# Patient Record
Sex: Female | Born: 1980
Health system: Southern US, Community
[De-identification: ages and names within clinical notes are randomized; demographics above are authoritative.]

## PROBLEM LIST (undated history)

## (undated) DIAGNOSIS — R0602 Shortness of breath: Secondary | ICD-10-CM

## (undated) DIAGNOSIS — R7303 Prediabetes: Secondary | ICD-10-CM

## (undated) DIAGNOSIS — M549 Dorsalgia, unspecified: Secondary | ICD-10-CM

## (undated) DIAGNOSIS — E538 Deficiency of other specified B group vitamins: Secondary | ICD-10-CM

## (undated) DIAGNOSIS — R5383 Other fatigue: Secondary | ICD-10-CM

## (undated) DIAGNOSIS — K5900 Constipation, unspecified: Secondary | ICD-10-CM

## (undated) DIAGNOSIS — E669 Obesity, unspecified: Secondary | ICD-10-CM

## (undated) DIAGNOSIS — E559 Vitamin D deficiency, unspecified: Secondary | ICD-10-CM

## (undated) DIAGNOSIS — R12 Heartburn: Secondary | ICD-10-CM

## (undated) HISTORY — DX: Vitamin D deficiency, unspecified: E55.9

## (undated) HISTORY — PX: LAPAROSCOPIC GASTRIC BANDING: SHX1100

## (undated) HISTORY — DX: Obesity, unspecified: E66.9

## (undated) HISTORY — DX: Dorsalgia, unspecified: M54.9

## (undated) HISTORY — DX: Prediabetes: R73.03

## (undated) HISTORY — DX: Heartburn: R12

## (undated) HISTORY — DX: Constipation, unspecified: K59.00

## (undated) HISTORY — DX: Shortness of breath: R06.02

## (undated) HISTORY — DX: Deficiency of other specified B group vitamins: E53.8

## (undated) HISTORY — DX: Other fatigue: R53.83

---
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
## 2002-03-21 ENCOUNTER — Other Ambulatory Visit: Admission: RE | Admit: 2002-03-21 | Discharge: 2002-03-21 | Payer: Self-pay | Admitting: *Deleted

## 2003-06-03 ENCOUNTER — Other Ambulatory Visit: Admission: RE | Admit: 2003-06-03 | Discharge: 2003-06-03 | Payer: Self-pay | Admitting: Obstetrics & Gynecology

## 2004-09-01 ENCOUNTER — Emergency Department (HOSPITAL_COMMUNITY): Admission: EM | Admit: 2004-09-01 | Discharge: 2004-09-01 | Payer: Self-pay | Admitting: Emergency Medicine

## 2005-06-13 ENCOUNTER — Ambulatory Visit (HOSPITAL_COMMUNITY): Admission: RE | Admit: 2005-06-13 | Discharge: 2005-06-13 | Payer: Self-pay | Admitting: Surgery

## 2005-06-14 ENCOUNTER — Ambulatory Visit (HOSPITAL_COMMUNITY): Admission: RE | Admit: 2005-06-14 | Discharge: 2005-06-14 | Payer: Self-pay | Admitting: Surgery

## 2006-07-25 ENCOUNTER — Ambulatory Visit (HOSPITAL_COMMUNITY): Admission: RE | Admit: 2006-07-25 | Discharge: 2006-07-25 | Payer: Self-pay | Admitting: Obstetrics & Gynecology

## 2009-03-13 ENCOUNTER — Emergency Department (HOSPITAL_COMMUNITY): Admission: EM | Admit: 2009-03-13 | Discharge: 2009-03-13 | Payer: Self-pay | Admitting: Emergency Medicine

## 2009-06-30 ENCOUNTER — Emergency Department (HOSPITAL_COMMUNITY): Admission: EM | Admit: 2009-06-30 | Discharge: 2009-06-30 | Payer: Self-pay | Admitting: Family Medicine

## 2010-01-13 ENCOUNTER — Observation Stay (HOSPITAL_COMMUNITY): Admission: EM | Admit: 2010-01-13 | Discharge: 2010-01-13 | Payer: Self-pay | Admitting: Emergency Medicine

## 2010-01-29 ENCOUNTER — Emergency Department (HOSPITAL_COMMUNITY): Admission: EM | Admit: 2010-01-29 | Discharge: 2010-01-29 | Payer: Self-pay | Admitting: Emergency Medicine

## 2010-11-25 ENCOUNTER — Ambulatory Visit: Payer: Self-pay | Admitting: Specialist

## 2010-12-01 ENCOUNTER — Ambulatory Visit: Payer: Self-pay | Admitting: Specialist

## 2011-03-12 LAB — DIFFERENTIAL
Basophils Absolute: 0.1 10*3/uL (ref 0.0–0.1)
Basophils Relative: 1 % (ref 0–1)
Eosinophils Absolute: 0 10*3/uL (ref 0.0–0.7)
Eosinophils Relative: 0 % (ref 0–5)
Lymphocytes Relative: 22 % (ref 12–46)
Lymphs Abs: 2.6 10*3/uL (ref 0.7–4.0)
Monocytes Absolute: 0.7 10*3/uL (ref 0.1–1.0)
Monocytes Relative: 6 % (ref 3–12)
Neutro Abs: 8.7 10*3/uL — ABNORMAL HIGH (ref 1.7–7.7)
Neutrophils Relative %: 72 % (ref 43–77)

## 2011-03-12 LAB — CBC
HCT: 42.1 % (ref 36.0–46.0)
Hemoglobin: 14.3 g/dL (ref 12.0–15.0)
MCHC: 33.9 g/dL (ref 30.0–36.0)
MCV: 83.2 fL (ref 78.0–100.0)
Platelets: 383 10*3/uL (ref 150–400)
RBC: 5.05 MIL/uL (ref 3.87–5.11)
RDW: 13.7 % (ref 11.5–15.5)
WBC: 12.2 10*3/uL — ABNORMAL HIGH (ref 4.0–10.5)

## 2011-03-12 LAB — URINALYSIS, ROUTINE W REFLEX MICROSCOPIC
Glucose, UA: NEGATIVE mg/dL
Hgb urine dipstick: NEGATIVE
Ketones, ur: >80 mg/dL — AB
Nitrite: NEGATIVE
Protein, ur: NEGATIVE mg/dL
Specific Gravity, Urine: 1.034 — ABNORMAL HIGH (ref 1.005–1.030)
Urobilinogen, UA: 1 mg/dL (ref 0.0–1.0)
pH: 5.5 (ref 5.0–8.0)

## 2011-03-12 LAB — POCT I-STAT, CHEM 8
BUN: 12 mg/dL (ref 6–23)
Calcium, Ion: 1.13 mmol/L (ref 1.12–1.32)
Chloride: 108 mEq/L (ref 96–112)
Creatinine, Ser: 0.8 mg/dL (ref 0.4–1.2)
Glucose, Bld: 93 mg/dL (ref 70–99)
HCT: 44 % (ref 36.0–46.0)
Hemoglobin: 15 g/dL (ref 12.0–15.0)
Potassium: 3.9 mEq/L (ref 3.5–5.1)
Sodium: 139 mEq/L (ref 135–145)
TCO2: 24 mmol/L (ref 0–100)

## 2011-03-12 LAB — HEPATIC FUNCTION PANEL
ALT: 18 U/L (ref 0–35)
AST: 18 U/L (ref 0–37)
Albumin: 4 g/dL (ref 3.5–5.2)
Alkaline Phosphatase: 75 U/L (ref 39–117)
Bilirubin, Direct: 0.2 mg/dL (ref 0.0–0.3)
Indirect Bilirubin: 0.8 mg/dL (ref 0.3–0.9)
Total Bilirubin: 1 mg/dL (ref 0.3–1.2)
Total Protein: 7.8 g/dL (ref 6.0–8.3)

## 2011-03-12 LAB — URINE MICROSCOPIC-ADD ON

## 2011-03-12 LAB — LIPASE, BLOOD: Lipase: 21 U/L (ref 11–59)

## 2011-04-02 LAB — POCT RAPID STREP A (OFFICE): Streptococcus, Group A Screen (Direct): NEGATIVE

## 2013-07-16 ENCOUNTER — Other Ambulatory Visit (HOSPITAL_COMMUNITY): Payer: Self-pay | Admitting: Obstetrics and Gynecology

## 2013-07-16 DIAGNOSIS — IMO0002 Reserved for concepts with insufficient information to code with codable children: Secondary | ICD-10-CM

## 2013-07-21 ENCOUNTER — Ambulatory Visit (HOSPITAL_COMMUNITY)
Admission: RE | Admit: 2013-07-21 | Discharge: 2013-07-21 | Disposition: A | Discharge status: Home / Self Care | Payer: BC Managed Care – PPO | Source: Ambulatory Visit | Attending: Obstetrics and Gynecology | Admitting: Obstetrics and Gynecology

## 2013-07-21 DIAGNOSIS — N979 Female infertility, unspecified: Secondary | ICD-10-CM | POA: Insufficient documentation

## 2013-07-21 DIAGNOSIS — IMO0002 Reserved for concepts with insufficient information to code with codable children: Secondary | ICD-10-CM

## 2013-07-21 MED ORDER — IOHEXOL 300 MG/ML  SOLN
10.0000 mL | Freq: Once | INTRAMUSCULAR | Status: AC | PRN
  Administered 2013-07-21: 10 mL

## 2017-04-02 ENCOUNTER — Other Ambulatory Visit (HOSPITAL_COMMUNITY): Payer: Self-pay | Admitting: General Surgery

## 2017-04-13 ENCOUNTER — Ambulatory Visit (HOSPITAL_COMMUNITY)
Admission: RE | Admit: 2017-04-13 | Discharge: 2017-04-13 | Disposition: A | Discharge status: Home / Self Care | Payer: Commercial Managed Care - PPO | Source: Ambulatory Visit | Attending: General Surgery | Admitting: General Surgery

## 2017-04-13 ENCOUNTER — Ambulatory Visit (HOSPITAL_COMMUNITY): Payer: Self-pay

## 2017-05-10 ENCOUNTER — Encounter (INDEPENDENT_AMBULATORY_CARE_PROVIDER_SITE_OTHER): Payer: Self-pay | Admitting: Family Medicine

## 2017-07-04 ENCOUNTER — Encounter (INDEPENDENT_AMBULATORY_CARE_PROVIDER_SITE_OTHER): Payer: Commercial Managed Care - PPO | Admitting: Family Medicine

## 2017-07-04 DIAGNOSIS — Z0289 Encounter for other administrative examinations: Secondary | ICD-10-CM

## 2017-07-13 ENCOUNTER — Other Ambulatory Visit (INDEPENDENT_AMBULATORY_CARE_PROVIDER_SITE_OTHER): Payer: Self-pay | Admitting: Family Medicine

## 2017-07-13 ENCOUNTER — Encounter (INDEPENDENT_AMBULATORY_CARE_PROVIDER_SITE_OTHER): Payer: Self-pay | Admitting: Family Medicine

## 2017-07-13 ENCOUNTER — Ambulatory Visit (INDEPENDENT_AMBULATORY_CARE_PROVIDER_SITE_OTHER): Payer: Commercial Managed Care - PPO | Admitting: Family Medicine

## 2017-07-13 VITALS — BP 106/67 | HR 61 | Temp 98.1°F | Resp 18 | Ht 63.0 in | Wt 231.0 lb

## 2017-07-13 DIAGNOSIS — IMO0001 Reserved for inherently not codable concepts without codable children: Secondary | ICD-10-CM

## 2017-07-13 DIAGNOSIS — E669 Obesity, unspecified: Secondary | ICD-10-CM | POA: Diagnosis not present

## 2017-07-13 DIAGNOSIS — Z1331 Encounter for screening for depression: Secondary | ICD-10-CM

## 2017-07-13 DIAGNOSIS — Z1389 Encounter for screening for other disorder: Secondary | ICD-10-CM

## 2017-07-13 DIAGNOSIS — R5383 Other fatigue: Secondary | ICD-10-CM

## 2017-07-13 DIAGNOSIS — R0600 Dyspnea, unspecified: Secondary | ICD-10-CM

## 2017-07-13 DIAGNOSIS — Z6841 Body Mass Index (BMI) 40.0 and over, adult: Secondary | ICD-10-CM

## 2017-07-13 DIAGNOSIS — R0609 Other forms of dyspnea: Secondary | ICD-10-CM

## 2017-07-14 LAB — CBC WITH DIFFERENTIAL
Basophils Absolute: 0 10*3/uL (ref 0.0–0.2)
Basos: 1 %
EOS (ABSOLUTE): 0.1 10*3/uL (ref 0.0–0.4)
Eos: 1 %
Hematocrit: 38.2 % (ref 34.0–46.6)
Hemoglobin: 12 g/dL (ref 11.1–15.9)
Immature Grans (Abs): 0 10*3/uL (ref 0.0–0.1)
Immature Granulocytes: 0 %
Lymphocytes Absolute: 1.7 10*3/uL (ref 0.7–3.1)
Lymphs: 23 %
MCH: 26.5 pg — ABNORMAL LOW (ref 26.6–33.0)
MCHC: 31.4 g/dL — ABNORMAL LOW (ref 31.5–35.7)
MCV: 85 fL (ref 79–97)
Monocytes Absolute: 0.4 10*3/uL (ref 0.1–0.9)
Monocytes: 5 %
Neutrophils Absolute: 5.2 10*3/uL (ref 1.4–7.0)
Neutrophils: 70 %
RBC: 4.52 x10E6/uL (ref 3.77–5.28)
RDW: 14.3 % (ref 12.3–15.4)
WBC: 7.3 10*3/uL (ref 3.4–10.8)

## 2017-07-14 LAB — COMPREHENSIVE METABOLIC PANEL
ALT: 8 IU/L (ref 0–32)
AST: 15 IU/L (ref 0–40)
Albumin/Globulin Ratio: 1.3 (ref 1.2–2.2)
Albumin: 3.9 g/dL (ref 3.5–5.5)
Alkaline Phosphatase: 74 IU/L (ref 39–117)
BUN/Creatinine Ratio: 21 (ref 9–23)
BUN: 14 mg/dL (ref 6–20)
Bilirubin Total: 0.3 mg/dL (ref 0.0–1.2)
CO2: 22 mmol/L (ref 20–29)
Calcium: 9.1 mg/dL (ref 8.7–10.2)
Chloride: 106 mmol/L (ref 96–106)
Creatinine, Ser: 0.68 mg/dL (ref 0.57–1.00)
GFR calc Af Amer: 133 mL/min/{1.73_m2} (ref >59)
GFR calc non Af Amer: 115 mL/min/{1.73_m2} (ref >59)
Globulin, Total: 3 g/dL (ref 1.5–4.5)
Glucose: 82 mg/dL (ref 65–99)
Potassium: 4.5 mmol/L (ref 3.5–5.2)
Sodium: 141 mmol/L (ref 134–144)
Total Protein: 6.9 g/dL (ref 6.0–8.5)

## 2017-07-14 LAB — HEMOGLOBIN A1C
Est. average glucose Bld gHb Est-mCnc: 111 mg/dL
Hgb A1c MFr Bld: 5.5 % (ref 4.8–5.6)

## 2017-07-14 LAB — T4, FREE: Free T4: 1.15 ng/dL (ref 0.82–1.77)

## 2017-07-14 LAB — INSULIN, RANDOM: INSULIN: 11 u[IU]/mL (ref 2.6–24.9)

## 2017-07-14 LAB — LIPID PANEL WITH LDL/HDL RATIO
Cholesterol, Total: 135 mg/dL (ref 100–199)
HDL: 61 mg/dL (ref >39)
LDL Calculated: 63 mg/dL (ref 0–99)
LDl/HDL Ratio: 1 ratio (ref 0.0–3.2)
Triglycerides: 54 mg/dL (ref 0–149)
VLDL Cholesterol Cal: 11 mg/dL (ref 5–40)

## 2017-07-14 LAB — FOLATE: Folate: 8.5 ng/mL (ref >3.0)

## 2017-07-14 LAB — VITAMIN B12: Vitamin B-12: 458 pg/mL (ref 232–1245)

## 2017-07-14 LAB — TSH: TSH: 1.65 u[IU]/mL (ref 0.450–4.500)

## 2017-07-14 LAB — T3: T3, Total: 116 ng/dL (ref 71–180)

## 2017-07-14 LAB — VITAMIN D 25 HYDROXY (VIT D DEFICIENCY, FRACTURES): Vit D, 25-Hydroxy: 23.5 ng/mL — ABNORMAL LOW (ref 30.0–100.0)

## 2017-07-16 NOTE — Progress Notes (Signed)
Office: [phone]  /  Fax: [phone]   Dear Dr. Redmond Boone,   Thank you for referring Heather Boone to our clinic. The following note includes my evaluation and treatment recommendations.  HPI:   Chief Complaint: OBESITY    Heather Boone has been referred by Heather Boone. Heather Pulling, MD for consultation regarding her obesity and obesity related comorbidities.    Heather Boone (MR# [id number]) is a 36 y.o. female who presents on 07/13/2017 for obesity evaluation and treatment. Current BMI is Body mass index is 40.92 kg/m.Marland Kitchen Heather Boone had Lap Band in 2011. Heather Boone heaviest pre-operative weight was 310 lbs and she lost down to 180 lbs within 5 months. Heather Boone kept this weight off for about 3 years before starting to regain weight and has now regained 50+ lbs from their lowest postoperative weight.     Shemica attended our information session and states she is currently in the action stage of change and ready to dedicate time achieving and maintaining a healthier weight. Heather Boone requests to join our [place] program to help manage their weight and relearn how to use their weight loss surgery to achieve improved health.    Heather Boone states her family eats meals together she thinks her family will eat healthier with  her her desired weight loss is 89 to 94 lbs she has been heavy most of  her life she started gaining weight last year her heaviest weight ever was 380 lbs. she has significant food cravings issues  she snacks frequently in the evenings she skips meals frequently she is frequently drinking liquids with calories she frequently makes poor food choices she frequently eats larger portions than normal  she has binge eating behaviors   Fatigue Heather Boone feels her energy is lower than it should be. This has worsened with weight gain and has not worsened recently. Heather Boone admits to daytime somnolence and  admits to waking up still tired. Patient is at risk for obstructive  sleep apnea. Patent has a history of symptoms of daytime fatigue and morning fatigue. Patient generally gets 7 hours of sleep per night, and states they generally have restless sleep. Snoring is not present. Apneic episodes are not present. Epworth Sleepiness Score is 5  Dyspnea on exertion Heather Boone notes increasing shortness of breath with exercising and seems to be worsening over time with weight gain. She notes getting out of breath sooner with activity than she used to. This has not gotten worse recently. Heather Boone denies orthopnea.  Depression Screen Heather Boone Food and Mood (modified PHQ-9) score was  Depression screen PHQ 2/9 07/13/2017  Decreased Interest 3  Down, Depressed, Hopeless 1  PHQ - 2 Score 4  Altered sleeping 3  Tired, decreased energy 3  Change in appetite 2  Feeling bad or failure about yourself  0  Trouble concentrating 0  Moving slowly or fidgety/restless 0  Suicidal thoughts 0  PHQ-9 Score 12    ALLERGIES: Allergies  Allergen Reactions  . Aleve [Naproxen Sodium]   . Motrin [Ibuprofen]   . Oxycodone-Acetaminophen   . Tramadol     MEDICATIONS: No current outpatient prescriptions on file prior to visit.   No current facility-administered medications on file prior to visit.     PAST MEDICAL HISTORY: Past Medical History:  Diagnosis Date  . Back pain   . Constipation   . Heartburn   . Obesity     PAST SURGICAL HISTORY: Past Surgical History:  Procedure Laterality Date  . LAPAROSCOPIC GASTRIC BANDING  dec 2011    SOCIAL HISTORY: Social History  Substance Use Topics  . Smoking status: Never Smoker  . Smokeless tobacco: Never Used  . Alcohol use No    FAMILY HISTORY: Family History  Problem Relation Age of Onset  . Hyperlipidemia Mother   . Hyperlipidemia Father   . Heart disease Father   . Cancer Father   . Obesity Father     ROS: Review of Systems  Constitutional: Positive for malaise/fatigue.  Respiratory: Positive for  shortness of breath (on exertion).   Cardiovascular: Negative for orthopnea.  Musculoskeletal: Positive for back pain.  Endo/Heme/Allergies:       Excessive Hunger  Psychiatric/Behavioral: The patient has insomnia.     PHYSICAL EXAM: Blood pressure 106/67, pulse 61, temperature 98.1 F (36.7 C), temperature source Oral, resp. rate 18, height 5\' 3"  (1.6 m), weight 231 lb (104.8 kg), last menstrual period 07/13/2017, SpO2 99 %. Body mass index is 40.92 kg/m. Physical Exam  Constitutional: She is oriented to person, place, and time. She appears well-developed and well-nourished.  Cardiovascular: Normal rate.   Pulmonary/Chest: Effort normal.  Neurological: She is oriented to person, place, and time.  Skin: Skin is warm and dry.  Psychiatric: She has a normal mood and affect. Her behavior is normal.  Vitals reviewed.   RECENT LABS AND TESTS: BMET    Component Value Date/Time   NA 139 01/13/2010 0220   K 3.9 01/13/2010 0220   CL 108 01/13/2010 0220   GLUCOSE 93 01/13/2010 0220   BUN 12 01/13/2010 0220   CREATININE 0.8 01/13/2010 0220   No results found for: HGBA1C No results found for: INSULIN CBC    Component Value Date/Time   WBC 12.2 (H) 01/13/2010 0205   RBC 5.05 01/13/2010 0205   HGB 15.0 01/13/2010 0220   HCT 44.0 01/13/2010 0220   PLT 383 01/13/2010 0205   MCV 83.2 01/13/2010 0205   MCHC 33.9 01/13/2010 0205   RDW 13.7 01/13/2010 0205   LYMPHSABS 2.6 01/13/2010 0205   MONOABS 0.7 01/13/2010 0205   EOSABS 0.0 01/13/2010 0205   BASOSABS 0.1 01/13/2010 0205   Iron/TIBC/Ferritin/ %Sat No results found for: IRON, TIBC, FERRITIN, IRONPCTSAT Lipid Panel  No results found for: CHOL, TRIG, HDL, CHOLHDL, VLDL, LDLCALC, LDLDIRECT Hepatic Function Panel     Component Value Date/Time   PROT 7.8 01/13/2010 0205   ALBUMIN 4.0 01/13/2010 0205   AST 18 01/13/2010 0205   ALT 18 01/13/2010 0205   ALKPHOS 75 01/13/2010 0205   BILITOT 1.0 01/13/2010 0205   BILIDIR 0.2  01/13/2010 0205   IBILI 0.8 01/13/2010 0205   No results found for: TSH  ECG  shows NSR with a rate of 72 BPM INDIRECT CALORIMETER done today shows a VO2 of 245 and a REE of 1707. Her calculated basal metabolic rate is 7619 thus her basal metabolic rate is worse than expected.    ASSESSMENT AND PLAN: Other fatigue - Plan: EKG 12-Lead, Comprehensive metabolic panel, CBC With Differential, Hemoglobin A1c, Insulin, random, Lipid Panel With LDL/HDL Ratio, VITAMIN D 25 Hydroxy (Vit-D Deficiency, Fractures), Vitamin B12, Folate, TSH, T4, free, T3  Dyspnea on exertion  Screening for depression  Class 3 obesity without serious comorbidity with body mass index (BMI) of 40.0 to 44.9 in adult, unspecified obesity type (HCC)  PLAN: Fatigue Keily was informed that her fatigue may be related to obesity, depression or many other causes. Labs will be ordered, and in the meanwhile Taeya has agreed to work  on diet, exercise and weight loss to help with fatigue. Proper sleep hygiene was discussed including the need for 7-8 hours of quality sleep each night. A sleep study was not ordered based on symptoms and Epworth score.  Dyspnea on exertion Mariame's shortness of breath appears to be obesity related and exercise induced. She has agreed to work on weight loss and gradually increase exercise to treat her exercise induced shortness of breath. If Lissette follows our instructions and loses weight without improvement of her shortness of breath, we will plan to refer to pulmonology. We will monitor this condition regularly. Monea agrees to this plan.  Depression Screen Jady had a moderately positive depression screening. Depression is commonly associated with obesity and often results in emotional eating behaviors. We will monitor this closely and work on CBT to help improve the non-hunger eating patterns. Referral to Psychology may be required if no improvement is seen as she continues in our  clinic.  Obesity Ireoluwa is currently in the action stage of change and her goal is to continue with weight loss efforts. I recommend Katessa begin the structured treatment plan as follows:  She has agreed to keep a food journal with 1200 to 1400 calories and 75 grams of protein daily  Ahtziri has been instructed to eventually work up to a goal of 150 minutes of combined cardio and strengthening exercise per week for weight loss and overall health benefits. We discussed the following Behavioral Modification Strategies today: increasing lean protein intake and keep a strict food journal  Desirai has agreed to join our [employer] and follow up with our clinic in 1 week. She was informed of the importance of frequent follow up visits to maximize her success with intensive lifestyle modifications for her multiple health conditions. She was informed we would discuss her lab results at her next visit unless there is a critical issue that needs to be addressed sooner. Jatziry agreed to keep her next visit at the agreed upon time to discuss these results.  I, Doreene Nest, am acting as transcriptionist for Dennard Nip, MD  I have reviewed the above documentation for accuracy and completeness, and I agree with the above. -Dennard Nip, MD   OBESITY BEHAVIORAL INTERVENTION VISIT  Today's visit was # 1 out of 65.  Starting weight: 231 lbs Starting date: 07/13/17 Today's weight : 231 lbs Today's date: 07/13/2017 Total lbs lost to date: 0 (Patients must lose 7 lbs in the first 6 months to continue with counseling)   ASK: We discussed the diagnosis of obesity with Heather Boone today and Tagen agreed to give [place] permission to discuss obesity behavioral modification therapy today.  ASSESS: April has the diagnosis of obesity and her BMI today is 1 Ceilidh is in the action stage of change   ADVISE: Amandalynn was educated on the multiple health risks of obesity as well  as the benefit of weight loss to improve her health. She was advised of the need for long term treatment and the importance of lifestyle modifications.  AGREE: Multiple dietary modification options and treatment options were discussed and  Paulyne agreed to keep a food journal with 1200 to 1400 calories and 75 grams of protein  We discussed the following Behavioral Modification Strategies today: increasing lean protein intake and keep a strict food journal

## 2017-07-20 ENCOUNTER — Ambulatory Visit (INDEPENDENT_AMBULATORY_CARE_PROVIDER_SITE_OTHER): Payer: Commercial Managed Care - PPO | Admitting: Family Medicine

## 2017-07-20 VITALS — BP 106/72 | HR 67 | Temp 97.9°F | Ht 63.0 in | Wt 231.0 lb

## 2017-07-20 DIAGNOSIS — E8881 Metabolic syndrome: Secondary | ICD-10-CM

## 2017-07-20 DIAGNOSIS — E669 Obesity, unspecified: Secondary | ICD-10-CM | POA: Diagnosis not present

## 2017-07-20 DIAGNOSIS — Z6841 Body Mass Index (BMI) 40.0 and over, adult: Secondary | ICD-10-CM | POA: Diagnosis not present

## 2017-07-20 DIAGNOSIS — IMO0001 Reserved for inherently not codable concepts without codable children: Secondary | ICD-10-CM

## 2017-07-20 DIAGNOSIS — E559 Vitamin D deficiency, unspecified: Secondary | ICD-10-CM | POA: Diagnosis not present

## 2017-07-20 MED ORDER — METFORMIN HCL 500 MG PO TABS: 500.0000 mg | ORAL_TABLET | Freq: Every day | ORAL | 0 refills | Status: DC

## 2017-07-20 MED ORDER — VITAMIN D (ERGOCALCIFEROL) 1.25 MG (50000 UNIT) PO CAPS: 50000.0000 [IU] | ORAL_CAPSULE | ORAL | 0 refills | Status: DC

## 2017-07-23 NOTE — Progress Notes (Signed)
Office: [phone]  /  Fax: [phone]   HPI:   Chief Complaint: OBESITY Heather Boone is here to discuss her progress with her obesity treatment plan. She is on the  keep a food journal with 1200 to 1400 calories and 75 grams of protein  and is following her eating plan approximately 100 % of the time. She states she is walking for 30 minutes 3 times per week. Heather Boone has maintained her weight but she is retaining fluid. She appears to have lost 2 lbs of fat this past week. Heather Boone is status post lap band surgery in 2011. Her weight is 231 lb (104.8 kg) today and has maintained weight over a period of 1 week since her last visit. She has lost 0 lbs since starting treatment with [place].  Vitamin D deficiency Heather Boone has a diagnosis of vitamin D deficiency. She was on calcium + vit D. Heather Boone admits fatigue and denies nausea, vomiting or muscle weakness.  Insulin Resistance Heather Boone has a new diagnosis of insulin resistance. Her A1c and fasting glucose are normal but her fasting insulin level is  >5 and she notes polyphagia even after eating. Although Heather Boone's blood glucose readings are still under good control, insulin resistance puts her at greater risk of metabolic syndrome and diabetes. She is not taking metformin currently and she continues to work on diet and exercise to decrease risk of diabetes.   ALLERGIES: Allergies  Allergen Reactions  . Aleve [Naproxen Sodium]   . Motrin [Ibuprofen]   . Oxycodone-Acetaminophen   . Tramadol     MEDICATIONS: No current outpatient prescriptions on file prior to visit.   No current facility-administered medications on file prior to visit.     PAST MEDICAL HISTORY: Past Medical History:  Diagnosis Date  . Back pain   . Constipation   . Heartburn   . Obesity     PAST SURGICAL HISTORY: Past Surgical History:  Procedure Laterality Date  . LAPAROSCOPIC GASTRIC BANDING     dec 2011    SOCIAL HISTORY: Social History  Substance  Use Topics  . Smoking status: Never Smoker  . Smokeless tobacco: Never Used  . Alcohol use No    FAMILY HISTORY: Family History  Problem Relation Age of Onset  . Hyperlipidemia Mother   . Hyperlipidemia Father   . Heart disease Father   . Cancer Father   . Obesity Father     ROS: Review of Systems  Constitutional: Positive for malaise/fatigue. Negative for weight loss.  Gastrointestinal: Negative for nausea and vomiting.  Musculoskeletal:       Negative muscle weakness  Endo/Heme/Allergies:       Polyphagia    PHYSICAL EXAM: Blood pressure 106/72, pulse 67, temperature 97.9 F (36.6 C), temperature source Oral, height 5\' 3"  (1.6 m), weight 231 lb (104.8 kg), last menstrual period 07/13/2017, SpO2 100 %. Body mass index is 40.92 kg/m. Physical Exam  Constitutional: She is oriented to person, place, and time. She appears well-developed and well-nourished.  Pulmonary/Chest: Effort normal.  Musculoskeletal: Normal range of motion.  Neurological: She is oriented to person, place, and time.  Skin: Skin is warm and dry.  Psychiatric: She has a normal mood and affect. Her behavior is normal.  Vitals reviewed.   RECENT LABS AND TESTS: BMET    Component Value Date/Time   NA 141 07/13/2017 1030   K 4.5 07/13/2017 1030   CL 106 07/13/2017 1030   CO2 22 07/13/2017 1030   GLUCOSE 82 07/13/2017 1030  GLUCOSE 93 01/13/2010 0220   BUN 14 07/13/2017 1030   CREATININE 0.68 07/13/2017 1030   CALCIUM 9.1 07/13/2017 1030   GFRNONAA 115 07/13/2017 1030   GFRAA 133 07/13/2017 1030   Lab Results  Component Value Date   HGBA1C 5.5 07/13/2017   Lab Results  Component Value Date   INSULIN 11.0 07/13/2017   CBC    Component Value Date/Time   WBC 7.3 07/13/2017 1030   WBC 12.2 (H) 01/13/2010 0205   RBC 4.52 07/13/2017 1030   RBC 5.05 01/13/2010 0205   HGB 12.0 07/13/2017 1030   HCT 38.2 07/13/2017 1030   PLT 383 01/13/2010 0205   MCV 85 07/13/2017 1030   MCH 26.5 (L)  07/13/2017 1030   MCHC 31.4 (L) 07/13/2017 1030   MCHC 33.9 01/13/2010 0205   RDW 14.3 07/13/2017 1030   LYMPHSABS 1.7 07/13/2017 1030   MONOABS 0.7 01/13/2010 0205   EOSABS 0.1 07/13/2017 1030   BASOSABS 0.0 07/13/2017 1030   Iron/TIBC/Ferritin/ %Sat No results found for: IRON, TIBC, FERRITIN, IRONPCTSAT Lipid Panel     Component Value Date/Time   CHOL 135 07/13/2017 1030   TRIG 54 07/13/2017 1030   HDL 61 07/13/2017 1030   LDLCALC 63 07/13/2017 1030   Hepatic Function Panel     Component Value Date/Time   PROT 6.9 07/13/2017 1030   ALBUMIN 3.9 07/13/2017 1030   AST 15 07/13/2017 1030   ALT 8 07/13/2017 1030   ALKPHOS 74 07/13/2017 1030   BILITOT 0.3 07/13/2017 1030   BILIDIR 0.2 01/13/2010 0205   IBILI 0.8 01/13/2010 0205      Component Value Date/Time   TSH 1.650 07/13/2017 1030    ASSESSMENT AND PLAN: Vitamin D deficiency - Plan: Vitamin D, Ergocalciferol, (DRISDOL) 50000 units CAPS capsule  Insulin resistance - Plan: metFORMIN (GLUCOPHAGE) 500 MG tablet  Class 3 obesity without serious comorbidity with body mass index (BMI) of 40.0 to 44.9 in adult, unspecified obesity type (Heather Boone)  PLAN:  Vitamin D Deficiency Heather Boone was informed that low vitamin D levels contributes to fatigue and are associated with obesity, breast, and colon cancer. She agrees to start to take prescription Vit D @50 ,000 IU every week #4 with no refills and will follow up for routine testing of vitamin D, at least 2-3 times per year. She was informed of the risk of over-replacement of vitamin D and agrees to not increase her dose unless he discusses this with [place] first. Heather Boone agrees to follow up with our clinic as needed as she continues her weight loss journey.  Insulin Resistance Heather Boone will continue to work on weight loss, exercise, and decreasing simple carbohydrates in her diet to help decrease the risk of diabetes. We dicussed metformin including benefits and risks. She was informed  that eating too many simple carbohydrates or too many calories at one sitting increases the likelihood of GI side effects. Heather Boone requested metformin for now and prescription was written today for metformin 500 mg every morning #30 with no refills. Heather Boone agreed to follow up with [place] as needed to monitor her progress.  Obesity Heather Boone is currently in the action stage of change. As such, her goal is to continue with weight loss efforts She has agreed to keep a food journal with 1200 to 1400 calories and 75+ grams of protein  Heather Boone has been instructed to work up to a goal of 150 minutes of combined cardio and strengthening exercise per week for weight loss and overall health benefits. We  discussed the following Behavioral Modification Strategies today: increasing lean protein intake, planning for success and keep a strict food journal  Heather Boone has agreed to follow up with our clinic as needed as she continues her weight loss journey. She was informed of the importance of frequent follow up visits to maximize her success with intensive lifestyle modifications for her multiple health conditions.  I, Heather Boone, am acting as transcriptionist for Heather Nip, MD  I have reviewed the above documentation for accuracy and completeness, and I agree with the above. -Heather Nip, MD   OBESITY BEHAVIORAL INTERVENTION VISIT  Today's visit was # 2 out of 45.  Starting weight: 231 lbs Starting date: 07/13/17 Today's weight : 231 lbs Today's date: 07/20/2017 Total lbs lost to date: 0 (Patients must lose 7 lbs in the first 6 months to continue with counseling)   ASK: We discussed the diagnosis of obesity with Tacy Learn today and Becci agreed to give [place] permission to discuss obesity behavioral modification therapy today.  ASSESS: Berenise has the diagnosis of obesity and her BMI today is 52 Onita is in the action stage of change   ADVISE: Orphia was educated on the  multiple health risks of obesity as well as the benefit of weight loss to improve her health. She was advised of the need for long term treatment and the importance of lifestyle modifications.  AGREE: Multiple dietary modification options and treatment options were discussed and  Abbigal agreed to keep a food journal with 1200 to 1400 calories and 75+ grams of protein  We discussed the following Behavioral Modification Strategies today: increasing lean protein intake, planning for success and keep a strict food journal

## 2017-07-26 ENCOUNTER — Ambulatory Visit (INDEPENDENT_AMBULATORY_CARE_PROVIDER_SITE_OTHER): Payer: Commercial Managed Care - PPO | Admitting: Family Medicine

## 2017-07-26 VITALS — Ht 63.0 in | Wt 232.0 lb

## 2017-07-26 DIAGNOSIS — Z6841 Body Mass Index (BMI) 40.0 and over, adult: Secondary | ICD-10-CM | POA: Diagnosis not present

## 2017-07-26 DIAGNOSIS — E669 Obesity, unspecified: Secondary | ICD-10-CM

## 2017-07-26 DIAGNOSIS — IMO0001 Reserved for inherently not codable concepts without codable children: Secondary | ICD-10-CM

## 2017-07-26 DIAGNOSIS — Z9189 Other specified personal risk factors, not elsewhere classified: Secondary | ICD-10-CM

## 2017-07-31 NOTE — Progress Notes (Signed)
  Office: [phone]  /  Fax: [phone]  OBESITY AND PREVENTATIVE COUNSELING BEHAVIORAL INTERVENTION VISIT   Today's visit was # 3 out of 62. (Back on Track #1)  Starting weight: 231 Starting date: 07/13/17 Today's weight : Weight: 232 lb (105.2 kg)  Today's date: 07/26/17 Total lbs lost to date: 0 (1 pound weight gain) (Patients must lose 7 lbs in the first 6 months to continue with counseling)  PREVENTATIVE COUNSELING: Alie is at high risk of developing multiple serious health conditions including uncontrolled diabetes, coronary artery disease, heart failure, sleep apnea, chronic pain, depression, obesity related cancers and more due to her weight. These risks have been discussed in depth and Daneesha has agreed to work on the underlying disease of obesity to decrease the risk of developing any and all of these obesity related disease   ASK: We discussed the diagnosis of obesity with Tacy Learn today and Yukari agreed to give [place] permission to discuss obesity behavioral modification therapy today.  ASSESS: Donzella has the diagnosis of obesity and her BMI today is 41.2 Honore is in the action stage of change   ADVISE: David was educated on the multiple health risks of obesity as well as the benefit of weight loss to improve her health. She was advised of the need for long term treatment and the importance of lifestyle modifications.  AGREE: Multiple dietary modification options and treatment options were discussed and  Randy agreed to keep a food journal with 1200 to 1400 calories and 75 grams of  protein  We discussed the following Behavioral Modification Stratagies today: increasing lean protein intake, decreasing simple carbohydrates , increasing vegetables, work on meal planning and easy cooking plans, decrease snacking  and avoiding temptations.  We spent > than 50% of the 15 minute visit on the counseling as documented in the note.

## 2017-08-02 ENCOUNTER — Ambulatory Visit (INDEPENDENT_AMBULATORY_CARE_PROVIDER_SITE_OTHER): Payer: Commercial Managed Care - PPO | Admitting: Family Medicine

## 2017-08-02 VITALS — Ht 63.0 in | Wt 229.0 lb

## 2017-08-02 DIAGNOSIS — Z9884 Bariatric surgery status: Secondary | ICD-10-CM | POA: Diagnosis not present

## 2017-08-02 DIAGNOSIS — E669 Obesity, unspecified: Secondary | ICD-10-CM | POA: Diagnosis not present

## 2017-08-02 DIAGNOSIS — Z9189 Other specified personal risk factors, not elsewhere classified: Secondary | ICD-10-CM | POA: Diagnosis not present

## 2017-08-02 DIAGNOSIS — Z6841 Body Mass Index (BMI) 40.0 and over, adult: Secondary | ICD-10-CM | POA: Diagnosis not present

## 2017-08-02 DIAGNOSIS — IMO0001 Reserved for inherently not codable concepts without codable children: Secondary | ICD-10-CM

## 2017-08-06 NOTE — Progress Notes (Signed)
  Office: [phone]  /  Fax: [phone]  OBESITY AND PREVENTATIVE COUNSELING BEHAVIORAL INTERVENTION VISIT  Today's visit was # 4 out of 33, BOT# 2  Starting weight: 231 lbs Starting date: 07/13/17 Today's weight : Weight: 229 lb (103.9 kg)  Today's date: 08/02/17 Total lbs lost to date: 3 pounds (Patients must lose 7 lbs in the first 6 months to continue with counseling)  PREVENTATIVE COUNSELING: Heather Boone is at high risk of developing multiple serious health conditions including uncontrolled diabetes, coronary artery disease, heart failure, sleep apnea, chronic pain, depression, obesity related cancers and more due to her weight. These risks have been discussed in depth and Heather Boone has agreed to work on the underlying disease of obesity to decrease the risk of developing any and all of these obesity related disease.  Emotional eating and emotional eating strategies were discussed at length today. Heather Boone agreed to implement strategies as appropriate to assist her weight loss efforts.   ASK: We discussed the diagnosis of obesity with Heather Boone today and Heather Boone agreed to give [place] permission to discuss obesity behavioral modification therapy today.  ASSESS: Heather Boone has the diagnosis of obesity and her BMI today is 40.7 Heather Boone is in the action stage of change   ADVISE: Heather Boone was educated on the multiple health risks of obesity as well as the benefit of weight loss to improve her health. She was advised of the need for long term treatment and the importance of lifestyle modifications.  AGREE: Multiple dietary modification options and treatment options were discussed and  Heather Boone agreed to keep a food journal with 1200 to 1400 calories and 75+ protein  We discussed the following Behavioral Modification Stratagies today: increasing lean protein intake, emotional eating strategies and avoiding temptations  We spent > than 50% of the 15 minute visit on the counseling as  documented in the note.

## 2017-08-09 ENCOUNTER — Ambulatory Visit (INDEPENDENT_AMBULATORY_CARE_PROVIDER_SITE_OTHER): Payer: Commercial Managed Care - PPO | Admitting: Family Medicine

## 2017-08-09 VITALS — Ht 63.0 in | Wt 229.0 lb

## 2017-08-09 DIAGNOSIS — Z6841 Body Mass Index (BMI) 40.0 and over, adult: Secondary | ICD-10-CM | POA: Diagnosis not present

## 2017-08-09 DIAGNOSIS — Z9884 Bariatric surgery status: Secondary | ICD-10-CM | POA: Diagnosis not present

## 2017-08-09 DIAGNOSIS — Z9189 Other specified personal risk factors, not elsewhere classified: Secondary | ICD-10-CM

## 2017-08-09 DIAGNOSIS — IMO0001 Reserved for inherently not codable concepts without codable children: Secondary | ICD-10-CM

## 2017-08-09 DIAGNOSIS — E669 Obesity, unspecified: Secondary | ICD-10-CM

## 2017-08-14 NOTE — Progress Notes (Signed)
  Office: [phone]  /  Fax: [phone]  OBESITY AND PREVENTATIVE COUNSELING BEHAVIORAL INTERVENTION VISIT  Today's visit was # 5 out of 34, BOT# 3  Starting weight: 231 Starting date: 07/13/17 Today's weight : Weight: 229 lb (103.9 kg)  Today's date: 08/09/17 Total lbs lost to date: 2 lbs (Patients must lose 7 lbs in the first 6 months to continue with counseling)  PREVENTATIVE COUNSELING: Heather Boone is at high risk of developing multiple serious health conditions including uncontrolled diabetes, coronary artery disease, heart failure, sleep apnea, chronic pain, depression, obesity related cancers and more due to her weight. These risks have been discussed in depth and Heather Boone has agreed to work on the underlying disease of obesity to decrease the risk of developing any and all of these obesity related disease.  Heather Boone continues to struggle to get in all of her recommended protein somedays. Strategies for eating healthy in unsupportive environments was discussed at length today. Focus on behavioral interventions for creating a supportive environment and minimizing temptations.   ASK: We discussed the diagnosis of obesity with Heather Boone today and Heather Boone agreed to give [place] permission to discuss obesity behavioral modification therapy today.  ASSESS: Heather Boone has the diagnosis of obesity and her BMI today is 40.7 Heather Boone is in the action stage of change   ADVISE: Heather Boone was educated on the multiple health risks of obesity as well as the benefit of weight loss to improve her health. She was advised of the need for long term treatment and the importance of lifestyle modifications.  AGREE: Multiple dietary modification options and treatment options were discussed and  Heather Boone agreed to keep a food journal with 1200 to 1400 calories and 75+ protein  We discussed the following Behavioral Modification Stratagies today: dealing with family or coworker sabotage and avoiding  temptations  We spent > than 50% of the 15 minute visit on the counseling as documented in the note.

## 2017-08-16 ENCOUNTER — Ambulatory Visit (INDEPENDENT_AMBULATORY_CARE_PROVIDER_SITE_OTHER): Payer: Commercial Managed Care - PPO | Admitting: Family Medicine

## 2017-08-16 VITALS — Ht 63.0 in | Wt 229.0 lb

## 2017-08-16 DIAGNOSIS — E669 Obesity, unspecified: Secondary | ICD-10-CM

## 2017-08-16 DIAGNOSIS — Z6841 Body Mass Index (BMI) 40.0 and over, adult: Secondary | ICD-10-CM | POA: Diagnosis not present

## 2017-08-16 DIAGNOSIS — R7303 Prediabetes: Secondary | ICD-10-CM | POA: Diagnosis not present

## 2017-08-16 DIAGNOSIS — E559 Vitamin D deficiency, unspecified: Secondary | ICD-10-CM | POA: Diagnosis not present

## 2017-08-16 DIAGNOSIS — Z9189 Other specified personal risk factors, not elsewhere classified: Secondary | ICD-10-CM | POA: Diagnosis not present

## 2017-08-16 DIAGNOSIS — IMO0001 Reserved for inherently not codable concepts without codable children: Secondary | ICD-10-CM

## 2017-08-16 MED ORDER — VITAMIN D (ERGOCALCIFEROL) 1.25 MG (50000 UNIT) PO CAPS: 50000.0000 [IU] | ORAL_CAPSULE | ORAL | 0 refills | Status: DC

## 2017-08-16 MED ORDER — METFORMIN HCL 500 MG PO TABS: 500.0000 mg | ORAL_TABLET | Freq: Every day | ORAL | 0 refills | Status: DC

## 2017-08-20 NOTE — Progress Notes (Signed)
Office: [phone]  /  Fax: [phone]   HPI:   Chief Complaint: pre-diabetes Pre-Diabetes Johnnetta has a diagnosis of prediabetes based on her elevated HgA1c and was informed this puts her at greater risk of developing diabetes. She is taking metformin currently and continues to work on diet and exercise to decrease risk of diabetes. She denies nausea or hypoglycemia.  Vitamin D deficiency Taelar has a diagnosis of vitamin D deficiency. She is currently taking vit D and denies nausea, vomiting or muscle weakness.  ALLERGIES: Allergies  Allergen Reactions  . Aleve [Naproxen Sodium]   . Motrin [Ibuprofen]   . Oxycodone-Acetaminophen   . Tramadol     MEDICATIONS: No current outpatient prescriptions on file prior to visit.   No current facility-administered medications on file prior to visit.     PAST MEDICAL HISTORY: Past Medical History:  Diagnosis Date  . Back pain   . Constipation   . Heartburn   . Obesity     PAST SURGICAL HISTORY: Past Surgical History:  Procedure Laterality Date  . LAPAROSCOPIC GASTRIC BANDING     dec 2011    SOCIAL HISTORY: Social History  Substance Use Topics  . Smoking status: Never Smoker  . Smokeless tobacco: Never Used  . Alcohol use No    FAMILY HISTORY: Family History  Problem Relation Age of Onset  . Hyperlipidemia Mother   . Hyperlipidemia Father   . Heart disease Father   . Cancer Father   . Obesity Father     ROS: ROS  PHYSICAL EXAM: Height 5\' 3"  (1.6 m), weight 229 lb (103.9 kg). Body mass index is 40.57 kg/m. Physical Exam  RECENT LABS AND TESTS: BMET    Component Value Date/Time   NA 141 07/13/2017 1030   K 4.5 07/13/2017 1030   CL 106 07/13/2017 1030   CO2 22 07/13/2017 1030   GLUCOSE 82 07/13/2017 1030   GLUCOSE 93 01/13/2010 0220   BUN 14 07/13/2017 1030   CREATININE 0.68 07/13/2017 1030   CALCIUM 9.1 07/13/2017 1030   GFRNONAA 115 07/13/2017 1030   GFRAA 133 07/13/2017 1030   Lab  Results  Component Value Date   HGBA1C 5.5 07/13/2017   Lab Results  Component Value Date   INSULIN 11.0 07/13/2017   CBC    Component Value Date/Time   WBC 7.3 07/13/2017 1030   WBC 12.2 (H) 01/13/2010 0205   RBC 4.52 07/13/2017 1030   RBC 5.05 01/13/2010 0205   HGB 12.0 07/13/2017 1030   HCT 38.2 07/13/2017 1030   PLT 383 01/13/2010 0205   MCV 85 07/13/2017 1030   MCH 26.5 (L) 07/13/2017 1030   MCHC 31.4 (L) 07/13/2017 1030   MCHC 33.9 01/13/2010 0205   RDW 14.3 07/13/2017 1030   LYMPHSABS 1.7 07/13/2017 1030   MONOABS 0.7 01/13/2010 0205   EOSABS 0.1 07/13/2017 1030   BASOSABS 0.0 07/13/2017 1030   Iron/TIBC/Ferritin/ %Sat No results found for: IRON, TIBC, FERRITIN, IRONPCTSAT Lipid Panel     Component Value Date/Time   CHOL 135 07/13/2017 1030   TRIG 54 07/13/2017 1030   HDL 61 07/13/2017 1030   LDLCALC 63 07/13/2017 1030   Hepatic Function Panel     Component Value Date/Time   PROT 6.9 07/13/2017 1030   ALBUMIN 3.9 07/13/2017 1030   AST 15 07/13/2017 1030   ALT 8 07/13/2017 1030   ALKPHOS 74 07/13/2017 1030   BILITOT 0.3 07/13/2017 1030   BILIDIR 0.2 01/13/2010 0205   IBILI  0.8 01/13/2010 0205      Component Value Date/Time   TSH 1.650 07/13/2017 1030    ASSESSMENT AND PLAN: Vitamin D deficiency - Plan: Vitamin D, Ergocalciferol, (DRISDOL) 50000 units CAPS capsule  Prediabetes - Plan: metFORMIN (GLUCOPHAGE) 500 MG tablet  At risk for heart disease  Class 3 obesity with serious comorbidity and body mass index (BMI) of 40.0 to 44.9 in adult, unspecified obesity type ([place])  PLAN: Pre-Diabetes Olivia will continue to work on weight loss, exercise, and decreasing simple carbohydrates in her diet to help decrease the risk of diabetes. We dicussed metformin including benefits and risks. She was informed that eating too many simple carbohydrates or too many calories at one sitting increases the likelihood of GI side effects. [name] requested  metformin for now and a prescription was written today. Alexiss agreed to follow up with [place] as directed to monitor her progress.  Vitamin D Deficiency Lakesa was informed that low vitamin D levels contributes to fatigue and are associated with obesity, breast, and colon cancer. She agrees to continue to take prescription Vit D @50 ,000 IU every week and will follow up for routine testing of vitamin D, at least 2-3 times per year. She was informed of the risk of over-replacement of vitamin D and agrees to not increase her dose unless he discusses this with [place] first.   I have reviewed the above documentation for accuracy and completeness, and I agree with the above. -Dennard Nip, MD   Office: [phone]  /  Fax: [phone]  OBESITY AND PREVENTATIVE COUNSELING BEHAVIORAL INTERVENTION VISIT  Today's visit was #6 out of 54, BOT# 4  Starting weight: 231 lbs Starting date: 07/13/17 Today's weight : Weight: 229 lb (103.9 kg)  Today's date:08/16/17 Total lbs lost to date: 2 lbs (Patients must lose 7 lbs in the first 6 months to continue with counseling)  PREVENTATIVE COUNSELING: Cailie is at high risk of developing multiple serious health conditions including uncontrolled diabetes, coronary artery disease, heart failure, sleep apnea, chronic pain, depression, obesity related cancers and more due to her weight. These risks have been discussed in depth and Georgene has agreed to work on the underlying disease of obesity to decrease the risk of developing any and all of these obesity related disease. Strategies for eating healthy for the holidays, celebrations and vacations were discussed at length today. Behavior modification strategies for overcoming feelings of guilt that may accompany overeating were also discussed at length today.   ASK: We discussed the diagnosis of obesity with Tacy Learn today and Lia agreed to give [place] permission to discuss obesity behavioral modification  therapy today.  ASSESS: Zoey has the diagnosis of obesity and her BMI today is 40.58 Vincenza is in the action stage of change   ADVISE: Keimani was educated on the multiple health risks of obesity as well as the benefit of weight loss to improve her health. She was advised of the need for long term treatment and the importance of lifestyle modifications.  AGREE: Multiple dietary modification options and treatment options were discussed and  Aqua agreed to keep a food journal with 1200 to 1400 calories and 75+ protein  We discussed the following Behavioral Modification Stratagies today: increasing lean protein intake, decreasing simple carbohydrates , holiday eating strategies  and avoiding temptations.  We spent > 15 minutes visit on the counseling as documented in the note.

## 2017-08-21 ENCOUNTER — Telehealth (INDEPENDENT_AMBULATORY_CARE_PROVIDER_SITE_OTHER): Payer: Self-pay | Admitting: Family Medicine

## 2017-08-21 NOTE — Telephone Encounter (Signed)
UNABLE TO LVM, MAILBOX FULL.  NEED TO ADVISE PT THAT HER $3000 DEDUCTIABLE WILL NEED TO BE MET BEFORE INS WILL START PAYING.  PT MAY WANT TO PAY SOMETHING TOWARDS THIS AT HER FUTURE APPTS.

## 2017-08-30 ENCOUNTER — Ambulatory Visit (INDEPENDENT_AMBULATORY_CARE_PROVIDER_SITE_OTHER): Payer: Commercial Managed Care - PPO | Admitting: Family Medicine

## 2017-08-30 VITALS — BP 100/66 | HR 63 | Temp 97.9°F | Ht 63.0 in | Wt 227.0 lb

## 2017-08-30 DIAGNOSIS — Z6841 Body Mass Index (BMI) 40.0 and over, adult: Secondary | ICD-10-CM

## 2017-08-30 DIAGNOSIS — IMO0001 Reserved for inherently not codable concepts without codable children: Secondary | ICD-10-CM

## 2017-08-30 DIAGNOSIS — E559 Vitamin D deficiency, unspecified: Secondary | ICD-10-CM | POA: Diagnosis not present

## 2017-08-30 DIAGNOSIS — E669 Obesity, unspecified: Secondary | ICD-10-CM

## 2017-08-30 NOTE — Progress Notes (Signed)
Office: [phone]  /  Fax: [phone]   HPI:   Chief Complaint: OBESITY Heather Boone is here to discuss her progress with her obesity treatment plan. She is on the keep a food journal with 1200 to 1400 calories and 75+ grams of protein daily and is following her eating plan approximately 90 to 95 % of the time. She states she is walking for 30 minutes 3 times per week. Heather Boone continues to do well with weight loss. She plans her meals ahead well and is making smarter food choices and she controls her portions. She would like more options for her meals. Her weight is 227 lb (103 kg) today and has had a weight loss of 2 pounds over a period of 6 weeks since her last visit. She has lost 4 lbs since starting treatment with [place].  Vitamin D deficiency Heather Boone has a diagnosis of vitamin D deficiency. She is currently taking vit D and denies nausea, vomiting or muscle weakness.   ALLERGIES: Allergies  Allergen Reactions  . Aleve [Naproxen Sodium]   . Motrin [Ibuprofen]   . Oxycodone-Acetaminophen   . Tramadol     MEDICATIONS: Current Outpatient Prescriptions on File Prior to Visit  Medication Sig Dispense Refill  . metFORMIN (GLUCOPHAGE) 500 MG tablet Take 1 tablet (500 mg total) by mouth daily with breakfast. 30 tablet 0  . Vitamin D, Ergocalciferol, (DRISDOL) 50000 units CAPS capsule Take 1 capsule (50,000 Units total) by mouth every 7 (seven) days. 4 capsule 0   No current facility-administered medications on file prior to visit.     PAST MEDICAL HISTORY: Past Medical History:  Diagnosis Date  . Back pain   . Constipation   . Heartburn   . Obesity     PAST SURGICAL HISTORY: Past Surgical History:  Procedure Laterality Date  . LAPAROSCOPIC GASTRIC BANDING     dec 2011    SOCIAL HISTORY: Social History  Substance Use Topics  . Smoking status: Never Smoker  . Smokeless tobacco: Never Used  . Alcohol use No    FAMILY HISTORY: Family History  Problem Relation Age  of Onset  . Hyperlipidemia Mother   . Hyperlipidemia Father   . Heart disease Father   . Cancer Father   . Obesity Father     ROS: Review of Systems  Constitutional: Positive for weight loss.  Gastrointestinal: Negative for nausea and vomiting.  Musculoskeletal:       Negative muscle weakness     PHYSICAL EXAM: Blood pressure 100/66, pulse 63, temperature 97.9 F (36.6 C), temperature source Oral, height 5\' 3"  (1.6 m), weight 227 lb (103 kg), last menstrual period 08/13/2017, SpO2 100 %. Body mass index is 40.21 kg/m. Physical Exam  Constitutional: She is oriented to person, place, and time. She appears well-developed and well-nourished.  Cardiovascular: Normal rate.   Pulmonary/Chest: Effort normal.  Musculoskeletal: Normal range of motion.  Neurological: She is oriented to person, place, and time.  Skin: Skin is warm and dry.  Psychiatric: She has a normal mood and affect. Her behavior is normal.  Vitals reviewed.   RECENT LABS AND TESTS: BMET    Component Value Date/Time   NA 141 07/13/2017 1030   K 4.5 07/13/2017 1030   CL 106 07/13/2017 1030   CO2 22 07/13/2017 1030   GLUCOSE 82 07/13/2017 1030   GLUCOSE 93 01/13/2010 0220   BUN 14 07/13/2017 1030   CREATININE 0.68 07/13/2017 1030   CALCIUM 9.1 07/13/2017 1030   GFRNONAA 115 07/13/2017  [place] 07/13/2017 1030   Lab Results  Component Value Date   HGBA1C 5.5 07/13/2017   Lab Results  Component Value Date   INSULIN 11.0 07/13/2017   CBC    Component Value Date/Time   WBC 7.3 07/13/2017 1030   WBC 12.2 (H) 01/13/2010 0205   RBC 4.52 07/13/2017 1030   RBC 5.05 01/13/2010 0205   HGB 12.0 07/13/2017 1030   HCT 38.2 07/13/2017 1030   PLT 383 01/13/2010 0205   MCV 85 07/13/2017 1030   MCH 26.5 (L) 07/13/2017 1030   MCHC 31.4 (L) 07/13/2017 1030   MCHC 33.9 01/13/2010 0205   RDW 14.3 07/13/2017 1030   LYMPHSABS 1.7 07/13/2017 1030   MONOABS 0.7 01/13/2010 0205   EOSABS 0.1 07/13/2017 1030    BASOSABS 0.0 07/13/2017 1030   Iron/TIBC/Ferritin/ %Sat No results found for: IRON, TIBC, FERRITIN, IRONPCTSAT Lipid Panel     Component Value Date/Time   CHOL 135 07/13/2017 1030   TRIG 54 07/13/2017 1030   HDL 61 07/13/2017 1030   LDLCALC 63 07/13/2017 1030   Hepatic Function Panel     Component Value Date/Time   PROT 6.9 07/13/2017 1030   ALBUMIN 3.9 07/13/2017 1030   AST 15 07/13/2017 1030   ALT 8 07/13/2017 1030   ALKPHOS 74 07/13/2017 1030   BILITOT 0.3 07/13/2017 1030   BILIDIR 0.2 01/13/2010 0205   IBILI 0.8 01/13/2010 0205      Component Value Date/Time   TSH 1.650 07/13/2017 1030    ASSESSMENT AND PLAN: Vitamin D deficiency  Class 3 obesity with serious comorbidity and body mass index (BMI) of 40.0 to 44.9 in adult, unspecified obesity type ([place])  PLAN:  Vitamin D Deficiency Heather Boone was informed that low vitamin D levels contributes to fatigue and are associated with obesity, breast, and colon cancer. She agrees to continue to take prescription Vit D @50 ,000 IU every week and will follow up for routine testing of vitamin D, at least 2-3 times per year. She was informed of the risk of over-replacement of vitamin D and agrees to not increase her dose unless he discusses this with [place] first.  We spent > than 50% of the 15 minute visit on the counseling as documented in the note.   Obesity Heather Boone is currently in the action stage of change. As such, her goal is to continue with weight loss efforts She has agreed to keep a food journal with 1200 to 1400 calories and 75+ grams of protein daily Heather Boone has been instructed to work up to a goal of 150 minutes of combined cardio and strengthening exercise per week for weight loss and overall health benefits. We discussed the following Behavioral Modification Strategies today: increasing lean protein intake and work on meal planning and easy cooking plans  Heather Boone has agreed to follow up with our clinic in 2  weeks. She was informed of the importance of frequent follow up visits to maximize her success with intensive lifestyle modifications for her multiple health conditions.  I, Doreene Nest, am acting as transcriptionist for Lacy Duverney, PA-C  I have reviewed the above documentation for accuracy and completeness, and I agree with the above. -Lacy Duverney, PA-C  I have reviewed the above note and agree with the plan. -Dennard Nip, MD   OBESITY BEHAVIORAL INTERVENTION VISIT  Today's visit was # 3 out of 22.  Starting weight: 231 lbs Starting date: 07/13/17 Today's weight : 227 lbs  Today's date: 08/30/2017 Total  lbs lost to date: 4 (Patients must lose 7 lbs in the first 6 months to continue with counseling)   ASK: We discussed the diagnosis of obesity with Tacy Learn today and Samamtha agreed to give [place] permission to discuss obesity behavioral modification therapy today.  ASSESS: Geeta has the diagnosis of obesity and her BMI today is 40.22 Ayeshia is in the action stage of change   ADVISE: Chinmayi was educated on the multiple health risks of obesity as well as the benefit of weight loss to improve her health. She was advised of the need for long term treatment and the importance of lifestyle modifications.  AGREE: Multiple dietary modification options and treatment options were discussed and  Talana agreed to keep a food journal with 1200 to 1400 calories and 75+ grams of protein daily We discussed the following Behavioral Modification Strategies today: increasing lean protein intake and work on meal planning and easy cooking plans

## 2017-09-11 ENCOUNTER — Telehealth (INDEPENDENT_AMBULATORY_CARE_PROVIDER_SITE_OTHER): Payer: Self-pay | Admitting: Physician Assistant

## 2017-09-11 NOTE — Telephone Encounter (Signed)
Spoke to [place] at [facility],  pt will need for next week dose.  Advised we will send electronically on 09/13/17 at appt scheduled with Dr Leafy Ro.   Thank you, Amy

## 2017-09-11 NOTE — Telephone Encounter (Signed)
[place] [facility] CALLED REGARDING VIT D REFILL REQUEST [place] [phone]

## 2017-09-13 ENCOUNTER — Ambulatory Visit (INDEPENDENT_AMBULATORY_CARE_PROVIDER_SITE_OTHER): Payer: Commercial Managed Care - PPO | Admitting: Physician Assistant

## 2017-09-13 VITALS — BP 103/52 | HR 74 | Temp 98.3°F | Ht 63.0 in | Wt 228.0 lb

## 2017-09-13 DIAGNOSIS — E8881 Metabolic syndrome: Secondary | ICD-10-CM

## 2017-09-13 DIAGNOSIS — E669 Obesity, unspecified: Secondary | ICD-10-CM

## 2017-09-13 DIAGNOSIS — E559 Vitamin D deficiency, unspecified: Secondary | ICD-10-CM

## 2017-09-13 DIAGNOSIS — Z9189 Other specified personal risk factors, not elsewhere classified: Secondary | ICD-10-CM | POA: Diagnosis not present

## 2017-09-13 DIAGNOSIS — IMO0001 Reserved for inherently not codable concepts without codable children: Secondary | ICD-10-CM

## 2017-09-13 DIAGNOSIS — Z6841 Body Mass Index (BMI) 40.0 and over, adult: Secondary | ICD-10-CM

## 2017-09-13 MED ORDER — METFORMIN HCL 500 MG PO TABS: 500.0000 mg | ORAL_TABLET | Freq: Two times a day (BID) | ORAL | 0 refills | Status: DC

## 2017-09-13 MED ORDER — VITAMIN D (ERGOCALCIFEROL) 1.25 MG (50000 UNIT) PO CAPS: 50000.0000 [IU] | ORAL_CAPSULE | ORAL | 0 refills | Status: DC

## 2017-09-17 NOTE — Progress Notes (Signed)
Office: [phone]  /  Fax: [phone]   HPI:   Chief Complaint: OBESITY Heather Boone is here to discuss her progress with her obesity treatment plan. She is on the  keep a food journal with 1200 to 1400 calories and 75+ grams of protein daily and is following her eating plan approximately 30 to 50 % of the time. She states she is exercising weights, cardio and the BELT program for 60 minutes 3 times per week. Heather Boone has visitors and had a harder time planning her meals ahead of time. She has made better food choices. Heather Boone noticed increased hunger in the evenings. Her weight is 228 lb (103.4 kg) today and has had a weight gain of 1 lb over a period of 2 weeks since her last visit. She has lost 3 lbs since starting treatment with [place].  Insulin Resistance Heather Boone has a diagnosis of insulin resistance based on her elevated fasting insulin level >5. Although Heather Boone's blood glucose readings are still under good control, insulin resistance puts her at greater risk of metabolic syndrome and diabetes. She is taking metformin currently but admits to polyphagia in the evening. She continues to work on diet and exercise to decrease risk of diabetes.  Vitamin D deficiency Heather Boone has a diagnosis of vitamin D deficiency. She is currently taking vit D and denies nausea, vomiting or muscle weakness.  At risk for diabetes Heather Boone is at higher than averagerisk for developing diabetes due to her obesity. She currently denies polyuria or polydipsia.   ALLERGIES: Allergies  Allergen Reactions  . Aleve [Naproxen Sodium]   . Motrin [Ibuprofen]   . Oxycodone-Acetaminophen   . Tramadol     MEDICATIONS: No current outpatient prescriptions on file prior to visit.   No current facility-administered medications on file prior to visit.     PAST MEDICAL HISTORY: Past Medical History:  Diagnosis Date  . Back pain   . Constipation   . Heartburn   . Obesity     PAST SURGICAL HISTORY: Past  Surgical History:  Procedure Laterality Date  . LAPAROSCOPIC GASTRIC BANDING     dec 2011    SOCIAL HISTORY: Social History  Substance Use Topics  . Smoking status: Never Smoker  . Smokeless tobacco: Never Used  . Alcohol use No    FAMILY HISTORY: Family History  Problem Relation Age of Onset  . Hyperlipidemia Mother   . Hyperlipidemia Father   . Heart disease Father   . Cancer Father   . Obesity Father     ROS: Review of Systems  Constitutional: Negative for weight loss.  Gastrointestinal: Negative for nausea and vomiting.  Musculoskeletal:       Negative muscle weakness  Endo/Heme/Allergies: Negative for polydipsia.       Negative polyuria  Positive polyphagia    PHYSICAL EXAM: Blood pressure (!) 103/52, pulse 74, temperature 98.3 F (36.8 C), temperature source Oral, height 5\' 3"  (1.6 m), weight 228 lb (103.4 kg), last menstrual period 09/08/2017, SpO2 100 %. Body mass index is 40.39 kg/m. Physical Exam  Constitutional: She is oriented to person, place, and time. She appears well-developed and well-nourished.  Cardiovascular: Normal rate.   Pulmonary/Chest: Effort normal.  Musculoskeletal: Normal range of motion.  Neurological: She is oriented to person, place, and time.  Skin: Skin is warm and dry.  Psychiatric: She has a normal mood and affect. Her behavior is normal.  Vitals reviewed.   RECENT LABS AND TESTS: BMET    Component Value Date/Time   NA  141 07/13/2017 1030   K 4.5 07/13/2017 1030   CL 106 07/13/2017 1030   CO2 22 07/13/2017 1030   GLUCOSE 82 07/13/2017 1030   GLUCOSE 93 01/13/2010 0220   BUN 14 07/13/2017 1030   CREATININE 0.68 07/13/2017 1030   CALCIUM 9.1 07/13/2017 1030   GFRNONAA 115 07/13/2017 1030   GFRAA 133 07/13/2017 1030   Lab Results  Component Value Date   HGBA1C 5.5 07/13/2017   Lab Results  Component Value Date   INSULIN 11.0 07/13/2017   CBC    Component Value Date/Time   WBC 7.3 07/13/2017 1030   WBC 12.2  (H) 01/13/2010 0205   RBC 4.52 07/13/2017 1030   RBC 5.05 01/13/2010 0205   HGB 12.0 07/13/2017 1030   HCT 38.2 07/13/2017 1030   PLT 383 01/13/2010 0205   MCV 85 07/13/2017 1030   MCH 26.5 (L) 07/13/2017 1030   MCHC 31.4 (L) 07/13/2017 1030   MCHC 33.9 01/13/2010 0205   RDW 14.3 07/13/2017 1030   LYMPHSABS 1.7 07/13/2017 1030   MONOABS 0.7 01/13/2010 0205   EOSABS 0.1 07/13/2017 1030   BASOSABS 0.0 07/13/2017 1030   Iron/TIBC/Ferritin/ %Sat No results found for: IRON, TIBC, FERRITIN, IRONPCTSAT Lipid Panel     Component Value Date/Time   CHOL 135 07/13/2017 1030   TRIG 54 07/13/2017 1030   HDL 61 07/13/2017 1030   LDLCALC 63 07/13/2017 1030   Hepatic Function Panel     Component Value Date/Time   PROT 6.9 07/13/2017 1030   ALBUMIN 3.9 07/13/2017 1030   AST 15 07/13/2017 1030   ALT 8 07/13/2017 1030   ALKPHOS 74 07/13/2017 1030   BILITOT 0.3 07/13/2017 1030   BILIDIR 0.2 01/13/2010 0205   IBILI 0.8 01/13/2010 0205      Component Value Date/Time   TSH 1.650 07/13/2017 1030    ASSESSMENT AND PLAN: Vitamin D deficiency - Plan: Vitamin D, Ergocalciferol, (DRISDOL) 50000 units CAPS capsule  Insulin resistance - Plan: metFORMIN (GLUCOPHAGE) 500 MG tablet  Class 3 obesity with serious comorbidity and body mass index (BMI) of 40.0 to 44.9 in adult, unspecified obesity type ([place])  PLAN:  Vitamin D Deficiency Payeton was informed that low vitamin D levels contributes to fatigue and are associated with obesity, breast, and colon cancer. She agrees to continue to take prescription Vit D @50 ,000 IU every week, we will refill for 1 month and will follow up for routine testing of vitamin D, at least 2-3 times per year. She was informed of the risk of over-replacement of vitamin D and agrees to not increase her dose unless he discusses this with [place] first. Haeleigh agrees to follow up with our clinic in 2 to 3 weeks.  Insulin Resistance Heather Boone will continue to work on weight  loss, exercise, and decreasing simple carbohydrates in her diet to help decrease the risk of diabetes. We dicussed metformin including benefits and risks. She was informed that eating too many simple carbohydrates or too many calories at one sitting increases the likelihood of GI side effects. Elisha agrees to increase metformin to 500 mg bid #60 with no refills for now and will follow up with [place] as directed to monitor her progress.  At risk for diabetes Laiklynn is at higher than averagerisk for developing diabetes due to her obesity. She currently denies polyuria or polydipsia.  Obesity Lorynn is currently in the action stage of change. As such, her goal is to continue with weight loss efforts She has agreed  to keep a food journal with 1200 to 1400 calories and 75 grams of protein daily Shekira has been instructed to work up to a goal of 150 minutes of combined cardio and strengthening exercise per week for weight loss and overall health benefits. We discussed the following Behavioral Modification Strategies today: increasing lean protein intake and work on meal planning and easy cooking plans  Halaina has agreed to follow up with our clinic in 2 to 3 weeks. She was informed of the importance of frequent follow up visits to maximize her success with intensive lifestyle modifications for her multiple health conditions.  I, Doreene Nest, am acting as transcriptionist for Lacy Duverney, PA-C  I have reviewed the above documentation for accuracy and completeness, and I agree with the above. -Lacy Duverney, PA-C  I have reviewed the above note and agree with the plan. -Dennard Nip, MD   OBESITY BEHAVIORAL INTERVENTION VISIT  Today's visit was # 4 out of 22.  Starting weight: 231 lbs Starting date: 07/13/17 Today's weight : @228  lbs Today's date: 09/13/2017 Total lbs lost to date: 3 (Patients must lose 7 lbs in the first 6 months to continue with counseling)   ASK: We discussed the  diagnosis of obesity with Tacy Learn today and Meiah agreed to give [place] permission to discuss obesity behavioral modification therapy today.  ASSESS: Alexyss has the diagnosis of obesity and her BMI today is 40.4 Kacelyn is in the action stage of change   ADVISE: Daianna was educated on the multiple health risks of obesity as well as the benefit of weight loss to improve her health. She was advised of the need for long term treatment and the importance of lifestyle modifications.  AGREE: Multiple dietary modification options and treatment options were discussed and  Ketina agreed to keep a food journal with 1200 to 1400' calories and 75 grams of protein daily We discussed the following Behavioral Modification Strategies today: increasing lean protein intake and work on meal planning and easy cooking plans

## 2017-10-04 ENCOUNTER — Encounter (INDEPENDENT_AMBULATORY_CARE_PROVIDER_SITE_OTHER): Payer: Self-pay

## 2017-10-04 ENCOUNTER — Ambulatory Visit (INDEPENDENT_AMBULATORY_CARE_PROVIDER_SITE_OTHER): Payer: Commercial Managed Care - PPO | Admitting: Physician Assistant

## 2017-10-15 ENCOUNTER — Other Ambulatory Visit (INDEPENDENT_AMBULATORY_CARE_PROVIDER_SITE_OTHER): Payer: Self-pay | Admitting: Family Medicine

## 2017-10-15 DIAGNOSIS — E8881 Metabolic syndrome: Secondary | ICD-10-CM

## 2017-10-15 DIAGNOSIS — E559 Vitamin D deficiency, unspecified: Secondary | ICD-10-CM

## 2017-10-18 ENCOUNTER — Ambulatory Visit (INDEPENDENT_AMBULATORY_CARE_PROVIDER_SITE_OTHER): Payer: Commercial Managed Care - PPO | Admitting: Physician Assistant

## 2017-10-18 VITALS — BP 111/73 | HR 65 | Temp 97.6°F | Ht 63.0 in | Wt 226.0 lb

## 2017-10-18 DIAGNOSIS — R7303 Prediabetes: Secondary | ICD-10-CM | POA: Diagnosis not present

## 2017-10-18 DIAGNOSIS — E559 Vitamin D deficiency, unspecified: Secondary | ICD-10-CM | POA: Diagnosis not present

## 2017-10-18 DIAGNOSIS — Z9189 Other specified personal risk factors, not elsewhere classified: Secondary | ICD-10-CM

## 2017-10-18 DIAGNOSIS — Z6841 Body Mass Index (BMI) 40.0 and over, adult: Secondary | ICD-10-CM | POA: Diagnosis not present

## 2017-10-18 MED ORDER — VITAMIN D (ERGOCALCIFEROL) 1.25 MG (50000 UNIT) PO CAPS: 50000.0000 [IU] | ORAL_CAPSULE | ORAL | 0 refills | Status: DC

## 2017-10-18 MED ORDER — METFORMIN HCL 500 MG PO TABS: 500.0000 mg | ORAL_TABLET | Freq: Two times a day (BID) | ORAL | 0 refills | Status: DC

## 2017-10-22 NOTE — Progress Notes (Signed)
Office: [phone]  /  Fax: [phone]   HPI:   Chief Complaint: OBESITY Heather Boone is here to discuss her progress with her obesity treatment plan. She is on the keep a food journal with 1200 to 1400 calories and 75 grams of protein daily and is following her eating plan approximately 50 % of the time. She states she is exercising 60 minutes 3 times per week. Heather Boone continues to do well with weight loss. She managed to plan her meals well and states her hunger is well controlled. Her weight is 226 lb (102.5 kg) today and has had a weight loss of 2 pounds over a period of 5 weeks since her last visit. She has lost 5 lbs since starting treatment with [place].  Vitamin D deficiency Heather Boone has a diagnosis of vitamin D deficiency. She is currently taking vit D and denies nausea, vomiting or muscle weakness.  Pre-Diabetes Heather Boone has a diagnosis of pre-diabetes based on her elevated Hgb A1c and was informed this puts her at greater risk of developing diabetes. She is taking metformin currently and continues to work on diet and exercise to decrease risk of diabetes. She denies nausea, polyphagia or hypoglycemia.  At risk for diabetes Heather Boone is at higher than average risk for developing diabetes due to her obesity and pre-diabetes. She currently denies polyuria or polydipsia.  ALLERGIES: Allergies  Allergen Reactions  . Aleve [Naproxen Sodium]   . Motrin [Ibuprofen]   . Oxycodone-Acetaminophen   . Tramadol     MEDICATIONS: No current outpatient prescriptions on file prior to visit.   No current facility-administered medications on file prior to visit.     PAST MEDICAL HISTORY: Past Medical History:  Diagnosis Date  . Back pain   . Constipation   . Heartburn   . Obesity     PAST SURGICAL HISTORY: Past Surgical History:  Procedure Laterality Date  . LAPAROSCOPIC GASTRIC BANDING     dec 2011    SOCIAL HISTORY: Social History  Substance Use Topics  . Smoking status:  Never Smoker  . Smokeless tobacco: Never Used  . Alcohol use No    FAMILY HISTORY: Family History  Problem Relation Age of Onset  . Hyperlipidemia Mother   . Hyperlipidemia Father   . Heart disease Father   . Cancer Father   . Obesity Father     ROS: Review of Systems  Constitutional: Positive for weight loss.  Gastrointestinal: Negative for nausea and vomiting.  Genitourinary: Negative for frequency.  Musculoskeletal:       Negative muscle weakness  Endo/Heme/Allergies: Negative for polydipsia.       Negative polyphagia Negative hypoglycemia    PHYSICAL EXAM: Blood pressure 111/73, pulse 65, temperature 97.6 F (36.4 C), temperature source Oral, height 5\' 3"  (1.6 m), weight 226 lb (102.5 kg), last menstrual period 10/04/2017, SpO2 99 %. Body mass index is 40.03 kg/m. Physical Exam  Constitutional: She is oriented to person, place, and time. She appears well-developed and well-nourished.  Cardiovascular: Normal rate.   Pulmonary/Chest: Effort normal.  Musculoskeletal: Normal range of motion.  Neurological: She is oriented to person, place, and time.  Skin: Skin is warm.  Psychiatric: She has a normal mood and affect. Her behavior is normal.  Vitals reviewed.   RECENT LABS AND TESTS: BMET    Component Value Date/Time   NA 141 07/13/2017 1030   K 4.5 07/13/2017 1030   CL 106 07/13/2017 1030   CO2 22 07/13/2017 1030   GLUCOSE 82 07/13/2017 1030  GLUCOSE 93 01/13/2010 0220   BUN 14 07/13/2017 1030   CREATININE 0.68 07/13/2017 1030   CALCIUM 9.1 07/13/2017 1030   GFRNONAA 115 07/13/2017 1030   GFRAA 133 07/13/2017 1030   Lab Results  Component Value Date   HGBA1C 5.5 07/13/2017   Lab Results  Component Value Date   INSULIN 11.0 07/13/2017   CBC    Component Value Date/Time   WBC 7.3 07/13/2017 1030   WBC 12.2 (H) 01/13/2010 0205   RBC 4.52 07/13/2017 1030   RBC 5.05 01/13/2010 0205   HGB 12.0 07/13/2017 1030   HCT 38.2 07/13/2017 1030   PLT 383  01/13/2010 0205   MCV 85 07/13/2017 1030   MCH 26.5 (L) 07/13/2017 1030   MCHC 31.4 (L) 07/13/2017 1030   MCHC 33.9 01/13/2010 0205   RDW 14.3 07/13/2017 1030   LYMPHSABS 1.7 07/13/2017 1030   MONOABS 0.7 01/13/2010 0205   EOSABS 0.1 07/13/2017 1030   BASOSABS 0.0 07/13/2017 1030   Iron/TIBC/Ferritin/ %Sat No results found for: IRON, TIBC, FERRITIN, IRONPCTSAT Lipid Panel     Component Value Date/Time   CHOL 135 07/13/2017 1030   TRIG 54 07/13/2017 1030   HDL 61 07/13/2017 1030   LDLCALC 63 07/13/2017 1030   Hepatic Function Panel     Component Value Date/Time   PROT 6.9 07/13/2017 1030   ALBUMIN 3.9 07/13/2017 1030   AST 15 07/13/2017 1030   ALT 8 07/13/2017 1030   ALKPHOS 74 07/13/2017 1030   BILITOT 0.3 07/13/2017 1030   BILIDIR 0.2 01/13/2010 0205   IBILI 0.8 01/13/2010 0205      Component Value Date/Time   TSH 1.650 07/13/2017 1030    ASSESSMENT AND PLAN: Vitamin D deficiency - Plan: Vitamin D, Ergocalciferol, (DRISDOL) 50000 units CAPS capsule  Prediabetes - Plan: metFORMIN (GLUCOPHAGE) 500 MG tablet  At risk for diabetes mellitus  Class 3 severe obesity with serious comorbidity and body mass index (BMI) of 40.0 to 44.9 in adult, unspecified obesity type ([place])  PLAN:  Vitamin D Deficiency Alec was informed that low vitamin D levels contributes to fatigue and are associated with obesity, breast, and colon cancer. She agrees to continue to take prescription Vit D @50 ,000 IU every week #4 with no refills and will follow up for routine testing of vitamin D, at least 2-3 times per year. She was informed of the risk of over-replacement of vitamin D and agrees to not increase her dose unless he discusses this with [place] first. Nesa agrees to follow up with our clinic in 2 weeks.  Pre-Diabetes Calandra will continue to work on weight loss, exercise, and decreasing simple carbohydrates in her diet to help decrease the risk of diabetes. We dicussed metformin  including benefits and risks. She was informed that eating too many simple carbohydrates or too many calories at one sitting increases the likelihood of GI side effects. Kameria agrees to continue metformin for now and a prescription was written today for metformin 500 mg bid #60 with no refills. Geoffrey agreed to follow up with [place] as directed to monitor her progress.  Diabetes risk counseling Shakisha was given extended (15 minutes) diabetes prevention counseling today. She is 36 y.o. female and has risk factors for diabetes including obesity and pre-diabetes. We discussed intensive lifestyle modifications today with an emphasis on weight loss as well as increasing exercise and decreasing simple carbohydrates in her diet.  Obesity Anna is currently in the action stage of change. As such, her goal is  to continue with weight loss efforts She has agreed to keep a food journal with 1400 calories and 75+ grams of protein daily [place] has been instructed to work up to a goal of 150 minutes of combined cardio and strengthening exercise per week for weight loss and overall health benefits. We discussed the following Behavioral Modification Strategies today: increasing lean protein intake and work on meal planning and easy cooking plans  Fionna has agreed to follow up with our clinic in 2 weeks. She was informed of the importance of frequent follow up visits to maximize her success with intensive lifestyle modifications for her multiple health conditions.  I, Doreene Nest, am acting as transcriptionist for Lacy Duverney, PA-C  I have reviewed the above documentation for accuracy and completeness, and I agree with the above. -Lacy Duverney, PA-C  I have reviewed the above note and agree with the plan. -Dennard Nip, MD   OBESITY BEHAVIORAL INTERVENTION VISIT  Today's visit was # 5 out of 22.  Starting weight: 231 lbs Starting date: 07/13/17 Today's weight : 226 lbs Today's date:  10/18/2017 Total lbs lost to date: 5 (Patients must lose 7 lbs in the first 6 months to continue with counseling)   ASK: We discussed the diagnosis of obesity with Tacy Learn today and Dyneshia agreed to give [place] permission to discuss obesity behavioral modification therapy today.  ASSESS: Rhyse has the diagnosis of obesity and her BMI today is 40.04 Maymie is in the action stage of change   ADVISE: Kensley was educated on the multiple health risks of obesity as well as the benefit of weight loss to improve her health. She was advised of the need for long term treatment and the importance of lifestyle modifications.  AGREE: Multiple dietary modification options and treatment options were discussed and  Shanece agreed to keep a food journal with 1400 calories and 75+ grams of protein daily We discussed the following Behavioral Modification Strategies today: increasing lean protein intake and work on meal planning and easy cooking plans

## 2017-11-01 ENCOUNTER — Ambulatory Visit (INDEPENDENT_AMBULATORY_CARE_PROVIDER_SITE_OTHER): Payer: Commercial Managed Care - PPO | Admitting: Family Medicine

## 2017-11-01 VITALS — BP 129/75 | HR 65 | Temp 97.7°F | Ht 63.0 in | Wt 223.0 lb

## 2017-11-01 DIAGNOSIS — E8881 Metabolic syndrome: Secondary | ICD-10-CM

## 2017-11-01 DIAGNOSIS — Z6839 Body mass index (BMI) 39.0-39.9, adult: Secondary | ICD-10-CM | POA: Diagnosis not present

## 2017-11-01 DIAGNOSIS — E559 Vitamin D deficiency, unspecified: Secondary | ICD-10-CM

## 2017-11-01 MED ORDER — VITAMIN D (ERGOCALCIFEROL) 1.25 MG (50000 UNIT) PO CAPS: 50000.0000 [IU] | ORAL_CAPSULE | ORAL | 0 refills | Status: DC

## 2017-11-01 MED ORDER — METFORMIN HCL 500 MG PO TABS
500.0000 mg | ORAL_TABLET | Freq: Two times a day (BID) | ORAL | 0 refills | Status: DC
Start: 2017-11-01 — End: 2017-12-13

## 2017-11-01 NOTE — Progress Notes (Signed)
Office: [phone]  /  Fax: [phone]   HPI:   Chief Complaint: OBESITY Heather Boone is here to discuss her progress with her obesity treatment plan. She is on the keep a food journal with 1400 calories and 75+ grams of protein daily and is following her eating plan approximately 60 % of the time. She states she is doing the belt program for 60 minutes 3 times per week. Heather Boone continues to do well with weight loss and journaling and working on increasing protein. She is getting ready to go on vacation and then Thanksgiving and she is worried about weight gain.  Her weight is 223 lb (101.2 kg) today and has had a weight loss of 3 pounds over a period of 2 weeks since her last visit. She has lost 8 lbs since starting treatment with [place].  Insulin Resistance Heather Boone has a diagnosis of insulin resistance based on her elevated fasting insulin level >5. Although Heather Boone's blood glucose readings are still under good control, insulin resistance puts her at greater risk of metabolic syndrome and diabetes. She is stable on metformin and continues to work on diet and exercise to decrease risk of diabetes. She denies nausea, vomiting, or hypoglycemia.  Vitamin D deficiency Heather Boone has a diagnosis of vitamin D deficiency. She is stable on prescription Vit D and denies nausea, vomiting or muscle weakness.  ALLERGIES: Allergies  Allergen Reactions  . Aleve [Naproxen Sodium]   . Motrin [Ibuprofen]   . Oxycodone-Acetaminophen   . Tramadol     MEDICATIONS: Current Outpatient Medications on File Prior to Visit  Medication Sig Dispense Refill  . metFORMIN (GLUCOPHAGE) 500 MG tablet Take 1 tablet (500 mg total) by mouth 2 (two) times daily with a meal. 60 tablet 0  . Vitamin D, Ergocalciferol, (DRISDOL) 50000 units CAPS capsule Take 1 capsule (50,000 Units total) by mouth every 7 (seven) days. 4 capsule 0   No current facility-administered medications on file prior to visit.     PAST MEDICAL  HISTORY: Past Medical History:  Diagnosis Date  . Back pain   . Constipation   . Heartburn   . Obesity     PAST SURGICAL HISTORY: Past Surgical History:  Procedure Laterality Date  . LAPAROSCOPIC GASTRIC BANDING     dec 2011    SOCIAL HISTORY: Social History   Tobacco Use  . Smoking status: Never Smoker  . Smokeless tobacco: Never Used  Substance Use Topics  . Alcohol use: No  . Drug use: No    FAMILY HISTORY: Family History  Problem Relation Age of Onset  . Hyperlipidemia Mother   . Hyperlipidemia Father   . Heart disease Father   . Cancer Father   . Obesity Father     ROS: Review of Systems  Constitutional: Positive for weight loss.  Gastrointestinal: Negative for nausea and vomiting.  Musculoskeletal:       Negative muscle weakness  Endo/Heme/Allergies:       Negative hypoglycemia    PHYSICAL EXAM: Blood pressure 129/75, pulse 65, temperature 97.7 F (36.5 C), temperature source Oral, height 5\' 3"  (1.6 m), weight 223 lb (101.2 kg), last menstrual period 10/04/2017, SpO2 100 %. Body mass index is 39.5 kg/m. Physical Exam  Constitutional: She is oriented to person, place, and time. She appears well-developed and well-nourished.  Cardiovascular: Normal rate.  Pulmonary/Chest: Effort normal.  Musculoskeletal: Normal range of motion.  Neurological: She is oriented to person, place, and time.  Skin: Skin is warm and dry.  Psychiatric: She  has a normal mood and affect. Her behavior is normal.  Vitals reviewed.   RECENT LABS AND TESTS: BMET    Component Value Date/Time   NA 141 07/13/2017 1030   K 4.5 07/13/2017 1030   CL 106 07/13/2017 1030   CO2 22 07/13/2017 1030   GLUCOSE 82 07/13/2017 1030   GLUCOSE 93 01/13/2010 0220   BUN 14 07/13/2017 1030   CREATININE 0.68 07/13/2017 1030   CALCIUM 9.1 07/13/2017 1030   GFRNONAA 115 07/13/2017 1030   GFRAA 133 07/13/2017 1030   Lab Results  Component Value Date   HGBA1C 5.5 07/13/2017   Lab  Results  Component Value Date   INSULIN 11.0 07/13/2017   CBC    Component Value Date/Time   WBC 7.3 07/13/2017 1030   WBC 12.2 (H) 01/13/2010 0205   RBC 4.52 07/13/2017 1030   RBC 5.05 01/13/2010 0205   HGB 12.0 07/13/2017 1030   HCT 38.2 07/13/2017 1030   PLT 383 01/13/2010 0205   MCV 85 07/13/2017 1030   MCH 26.5 (L) 07/13/2017 1030   MCHC 31.4 (L) 07/13/2017 1030   MCHC 33.9 01/13/2010 0205   RDW 14.3 07/13/2017 1030   LYMPHSABS 1.7 07/13/2017 1030   MONOABS 0.7 01/13/2010 0205   EOSABS 0.1 07/13/2017 1030   BASOSABS 0.0 07/13/2017 1030   Iron/TIBC/Ferritin/ %Sat No results found for: IRON, TIBC, FERRITIN, IRONPCTSAT Lipid Panel     Component Value Date/Time   CHOL 135 07/13/2017 1030   TRIG 54 07/13/2017 1030   HDL 61 07/13/2017 1030   LDLCALC 63 07/13/2017 1030   Hepatic Function Panel     Component Value Date/Time   PROT 6.9 07/13/2017 1030   ALBUMIN 3.9 07/13/2017 1030   AST 15 07/13/2017 1030   ALT 8 07/13/2017 1030   ALKPHOS 74 07/13/2017 1030   BILITOT 0.3 07/13/2017 1030   BILIDIR 0.2 01/13/2010 0205   IBILI 0.8 01/13/2010 0205      Component Value Date/Time   TSH 1.650 07/13/2017 1030    ASSESSMENT AND PLAN: Insulin resistance - Plan: Comprehensive metabolic panel, Hemoglobin A1c, Insulin, random, metFORMIN (GLUCOPHAGE) 500 MG tablet  Vitamin D deficiency - Plan: VITAMIN D 25 Hydroxy (Vit-D Deficiency, Fractures), Vitamin D, Ergocalciferol, (DRISDOL) 50000 units CAPS capsule  Class 2 severe obesity with serious comorbidity and body mass index (BMI) of 39.0 to 39.9 in adult, unspecified obesity type ([place])  PLAN:  Insulin Resistance Heather Boone will continue to work on weight loss, exercise, and decreasing simple carbohydrates in her diet to help decrease the risk of diabetes. We dicussed metformin including benefits and risks. She was informed that eating too many simple carbohydrates or too many calories at one sitting increases the likelihood of  GI side effects. Heather Boone agrees to continue taking metfotmin 500 mg BID #60 and we will refill for 1 month. We will recheck labs and Heather Boone agrees to follow up with our clinic in 3 weeks with myself and follow up in 5 weeks with our dietitian as directed to monitor her progress.  Vitamin D Deficiency Heather Boone was informed that low vitamin D levels contributes to fatigue and are associated with obesity, breast, and colon cancer. Heather Boone agrees to continue taking prescription Vit D @50 ,000 IU every week #4 and we will refill for 1 month. She will follow up for routine testing of vitamin D, at least 2-3 times per year. She was informed of the risk of over-replacement of vitamin D and agrees to not increase her dose unless  he discusses this with [place] first. Heather Boone agrees to follow up with our clinic in 3 weeks with myself and follow up in 5 weeks with our dietitian.  Obesity Heather Boone is currently in the action stage of change. As such, her goal is to continue with weight loss efforts She has agreed to keep a food journal with 1400 calories and 75+ grams of protein daily Heather Boone has been instructed to work up to a goal of 150 minutes of combined cardio and strengthening exercise per week for weight loss and overall health benefits. We discussed the following Behavioral Modification Strategies today: increasing lean protein intake, no skipping meals, better snacking choices, travel eating strategies, holiday eating strategies, and celebration eating strategies.    Heather Boone has agreed to follow up with our clinic in 3 weeks with myself and follow up in 5 weeks with our dietitian. She was informed of the importance of frequent follow up visits to maximize her success with intensive lifestyle modifications for her multiple health conditions.  I, Trixie Dredge, am acting as transcriptionist for Dennard Nip, MD  I have reviewed the above documentation for accuracy and completeness, and I agree with the  above. -Dennard Nip, MD      Today's visit was # 6 out of 22.  Starting weight: 231 lbs Starting date: 07/13/17 Today's weight : 223 lbs  Today's date: 11/01/2017 Total lbs lost to date: 8 (Patients must lose 7 lbs in the first 6 months to continue with counseling)   ASK: We discussed the diagnosis of obesity with Heather Boone today and Heather Boone agreed to give [place] permission to discuss obesity behavioral modification therapy today.  ASSESS: Heather Boone has the diagnosis of obesity and her BMI today is 39.51 Heather Boone is in the action stage of change   ADVISE: Heather Boone was educated on the multiple health risks of obesity as well as the benefit of weight loss to improve her health. She was advised of the need for long term treatment and the importance of lifestyle modifications.  AGREE: Multiple dietary modification options and treatment options were discussed and  Heather Boone agreed to keep a food journal with 1400 calories and 75+ grams of protein daily We discussed the following Behavioral Modification Strategies today: increasing lean protein intake, no skipping meals, better snacking choices, travel eating strategies, holiday eating strategies, and celebration eating strategies.

## 2017-11-02 LAB — COMPREHENSIVE METABOLIC PANEL
ALT: 12 IU/L (ref 0–32)
AST: 15 IU/L (ref 0–40)
Albumin/Globulin Ratio: 1.3 (ref 1.2–2.2)
Albumin: 3.6 g/dL (ref 3.5–5.5)
Alkaline Phosphatase: 76 IU/L (ref 39–117)
BUN/Creatinine Ratio: 16 (ref 9–23)
BUN: 10 mg/dL (ref 6–20)
Bilirubin Total: 0.3 mg/dL (ref 0.0–1.2)
CO2: 24 mmol/L (ref 20–29)
Calcium: 9.2 mg/dL (ref 8.7–10.2)
Chloride: 107 mmol/L — ABNORMAL HIGH (ref 96–106)
Creatinine, Ser: 0.62 mg/dL (ref 0.57–1.00)
GFR calc Af Amer: 137 mL/min/{1.73_m2} (ref >59)
GFR calc non Af Amer: 119 mL/min/{1.73_m2} (ref >59)
Globulin, Total: 2.8 g/dL (ref 1.5–4.5)
Glucose: 90 mg/dL (ref 65–99)
Potassium: 4.2 mmol/L (ref 3.5–5.2)
Sodium: 142 mmol/L (ref 134–144)
Total Protein: 6.4 g/dL (ref 6.0–8.5)

## 2017-11-02 LAB — HEMOGLOBIN A1C
Est. average glucose Bld gHb Est-mCnc: 100 mg/dL
Hgb A1c MFr Bld: 5.1 % (ref 4.8–5.6)

## 2017-11-02 LAB — VITAMIN D 25 HYDROXY (VIT D DEFICIENCY, FRACTURES): Vit D, 25-Hydroxy: 38.9 ng/mL (ref 30.0–100.0)

## 2017-11-02 LAB — INSULIN, RANDOM: INSULIN: 14.2 u[IU]/mL (ref 2.6–24.9)

## 2017-11-22 ENCOUNTER — Ambulatory Visit (INDEPENDENT_AMBULATORY_CARE_PROVIDER_SITE_OTHER): Payer: Commercial Managed Care - PPO | Admitting: Family Medicine

## 2017-11-22 VITALS — BP 135/80 | HR 62 | Ht 63.0 in | Wt 221.0 lb

## 2017-11-22 DIAGNOSIS — Z6839 Body mass index (BMI) 39.0-39.9, adult: Secondary | ICD-10-CM

## 2017-11-22 DIAGNOSIS — E8881 Metabolic syndrome: Secondary | ICD-10-CM | POA: Diagnosis not present

## 2017-11-22 NOTE — Progress Notes (Signed)
Office: [phone]  /  Fax: [phone]   HPI:   Chief Complaint: OBESITY Heather Boone is here to discuss her progress with her obesity treatment plan. She is on the keep a food journal with 1400 calories and 75+ grams of protein daily and is following her eating plan approximately 80 % of the time. She states she is doing the belt program and walking for 60 minutes 3 times per week. Heather Boone continues to do well with weight loss even over Thanksgiving and vacation at [employer]. She has not been meeting her protein goals.  Her weight is 221 lb (100.2 kg) today and has had a weight loss of 2 pounds over a period of 3 weeks since her last visit. She has lost 10 lbs since starting treatment with [place].  Insulin Resistance Hagen has a diagnosis of insulin resistance based on her elevated fasting insulin level >5. She is stable on metformin and no GI upset. Her A1c has improved but insulin is still elevated. Although Heather Boone's blood glucose readings are still under good control, insulin resistance puts her at greater risk of metabolic syndrome and diabetes. She continues to work on diet and exercise to decrease risk of diabetes.  ALLERGIES: Allergies  Allergen Reactions  . Aleve [Naproxen Sodium]   . Motrin [Ibuprofen]   . Oxycodone-Acetaminophen   . Tramadol     MEDICATIONS: Current Outpatient Medications on File Prior to Visit  Medication Sig Dispense Refill  . metFORMIN (GLUCOPHAGE) 500 MG tablet Take 1 tablet (500 mg total) 2 (two) times daily with a meal by mouth. 60 tablet 0  . Vitamin D, Ergocalciferol, (DRISDOL) 50000 units CAPS capsule Take 1 capsule (50,000 Units total) every 7 (seven) days by mouth. 4 capsule 0   No current facility-administered medications on file prior to visit.     PAST MEDICAL HISTORY: Past Medical History:  Diagnosis Date  . Back pain   . Constipation   . Heartburn   . Obesity     PAST SURGICAL HISTORY: Past Surgical History:  Procedure  Laterality Date  . LAPAROSCOPIC GASTRIC BANDING     dec 2011    SOCIAL HISTORY: Social History   Tobacco Use  . Smoking status: Never Smoker  . Smokeless tobacco: Never Used  Substance Use Topics  . Alcohol use: No  . Drug use: No    FAMILY HISTORY: Family History  Problem Relation Age of Onset  . Hyperlipidemia Mother   . Hyperlipidemia Father   . Heart disease Father   . Cancer Father   . Obesity Father     ROS: Review of Systems  Constitutional: Positive for weight loss.    PHYSICAL EXAM: Blood pressure 135/80, pulse 62, height 5\' 3"  (1.6 m), weight 221 lb (100.2 kg), last menstrual period 11/21/2017, SpO2 100 %. Body mass index is 39.15 kg/m. Physical Exam  Constitutional: She is oriented to person, place, and time. She appears well-developed and well-nourished.  Cardiovascular: Normal rate.  Pulmonary/Chest: Effort normal.  Musculoskeletal: Normal range of motion.  Neurological: She is oriented to person, place, and time.  Skin: Skin is warm and dry.  Psychiatric: She has a normal mood and affect. Her behavior is normal.  Vitals reviewed.   RECENT LABS AND TESTS: BMET    Component Value Date/Time   NA 142 11/01/2017 0804   K 4.2 11/01/2017 0804   CL 107 (H) 11/01/2017 0804   CO2 24 11/01/2017 0804   GLUCOSE 90 11/01/2017 0804   GLUCOSE 93 01/13/2010  0220   BUN 10 11/01/2017 0804   CREATININE 0.62 11/01/2017 0804   CALCIUM 9.2 11/01/2017 0804   GFRNONAA 119 11/01/2017 0804   GFRAA 137 11/01/2017 0804   Lab Results  Component Value Date   HGBA1C 5.1 11/01/2017   HGBA1C 5.5 07/13/2017   Lab Results  Component Value Date   INSULIN 14.2 11/01/2017   INSULIN 11.0 07/13/2017   CBC    Component Value Date/Time   WBC 7.3 07/13/2017 1030   WBC 12.2 (H) 01/13/2010 0205   RBC 4.52 07/13/2017 1030   RBC 5.05 01/13/2010 0205   HGB 12.0 07/13/2017 1030   HCT 38.2 07/13/2017 1030   PLT 383 01/13/2010 0205   MCV 85 07/13/2017 1030   MCH 26.5 (L)  07/13/2017 1030   MCHC 31.4 (L) 07/13/2017 1030   MCHC 33.9 01/13/2010 0205   RDW 14.3 07/13/2017 1030   LYMPHSABS 1.7 07/13/2017 1030   MONOABS 0.7 01/13/2010 0205   EOSABS 0.1 07/13/2017 1030   BASOSABS 0.0 07/13/2017 1030   Iron/TIBC/Ferritin/ %Sat No results found for: IRON, TIBC, FERRITIN, IRONPCTSAT Lipid Panel     Component Value Date/Time   CHOL 135 07/13/2017 1030   TRIG 54 07/13/2017 1030   HDL 61 07/13/2017 1030   LDLCALC 63 07/13/2017 1030   Hepatic Function Panel     Component Value Date/Time   PROT 6.4 11/01/2017 0804   ALBUMIN 3.6 11/01/2017 0804   AST 15 11/01/2017 0804   ALT 12 11/01/2017 0804   ALKPHOS 76 11/01/2017 0804   BILITOT 0.3 11/01/2017 0804   BILIDIR 0.2 01/13/2010 0205   IBILI 0.8 01/13/2010 0205      Component Value Date/Time   TSH 1.650 07/13/2017 1030    ASSESSMENT AND PLAN: Insulin resistance  Class 2 severe obesity with serious comorbidity and body mass index (BMI) of 39.0 to 39.9 in adult, unspecified obesity type ([place])  PLAN:  Insulin Resistance Heather Boone will continue to work on weight loss, exercise, and decreasing simple carbohydrates in her diet to help decrease the risk of diabetes. We dicussed metformin including benefits and risks. She was informed that eating too many simple carbohydrates or too many calories at one sitting increases the likelihood of GI side effects. Heather Boone agrees to continue taking metformin as prescribed and work on increasing lean protein. Any agrees to follow up with our clinic in 2 weeks as directed to monitor her progress.  We spent > than 50% of the 15 minute visit on the counseling as documented in the note.  Obesity Heather Boone is currently in the action stage of change. As such, her goal is to continue with weight loss efforts She has agreed to keep a food journal with 1400 calories and 75+ grams of protein daily Heather Boone has been instructed to work up to a goal of 150 minutes of combined  cardio and strengthening exercise per week for weight loss and overall health benefits. We discussed the following Behavioral Modification Strategies today: increasing lean protein intake and holiday eating strategies and increase H20 intake   Heather Boone has agreed to follow up with our clinic in 2 weeks. She was informed of the importance of frequent follow up visits to maximize her success with intensive lifestyle modifications for her multiple health conditions.  I, Trixie Dredge, am acting as transcriptionist for Dennard Nip, MD  I have reviewed the above documentation for accuracy and completeness, and I agree with the above. -Dennard Nip, MD     Today's visit was #  7 out of 22.  Starting weight: 231 lbs Starting date: 07/13/17 Today's weight : 221 lbs  Today's date: 11/22/2017 Total lbs lost to date: 10 (Patients must lose 7 lbs in the first 6 months to continue with counseling)   ASK: We discussed the diagnosis of obesity with Tacy Learn today and Kambra agreed to give [place] permission to discuss obesity behavioral modification therapy today.  ASSESS: Jenayah has the diagnosis of obesity and her BMI today is 39.16 Donita is in the action stage of change   ADVISE: Karma was educated on the multiple health risks of obesity as well as the benefit of weight loss to improve her health. She was advised of the need for long term treatment and the importance of lifestyle modifications.  AGREE: Multiple dietary modification options and treatment options were discussed and  Roselina agreed to keep a food journal with 1400 calories and 75+ grams of protein daily We discussed the following Behavioral Modification Strategies today: increasing lean protein intake and holiday eating strategies and increase H20 intake

## 2017-12-06 ENCOUNTER — Ambulatory Visit (INDEPENDENT_AMBULATORY_CARE_PROVIDER_SITE_OTHER): Payer: Commercial Managed Care - PPO | Admitting: Dietician

## 2017-12-06 VITALS — Ht 63.0 in | Wt 217.0 lb

## 2017-12-06 DIAGNOSIS — Z6838 Body mass index (BMI) 38.0-38.9, adult: Secondary | ICD-10-CM | POA: Diagnosis not present

## 2017-12-06 DIAGNOSIS — E8881 Metabolic syndrome: Secondary | ICD-10-CM

## 2017-12-06 DIAGNOSIS — Z9189 Other specified personal risk factors, not elsewhere classified: Secondary | ICD-10-CM

## 2017-12-10 NOTE — Progress Notes (Signed)
  Office: [phone]  /  Fax: [phone]     Heather Boone has a diagnosis of insulin resistance based on her elevated fasting insulin level >5. Although Heather Boone's blood glucose readings are still under good control, insulin resistance puts her at greater risk of metabolic syndrome and diabetes. She is here today for diabetes risk nutrition counseling.   Heather Boone's weight today is 217 lbs, a 4 lb weight loss since her last visit. She has had a weight loss of 14 lbs since beginning treatment with [place]. Heather Boone is journaling her food intake, 1400 calories and 75+ grams of protein are her nutrition goals. She states she is journaling approximately 75% of the time and she states she stayed on track even during the snow days. She reports her hunger is manageable with the Metformin and she denies any GI side effects with the medication. She remains motivated to continue her weight loss treatment plan even through the Christmas holidays. Healthy holiday eating was discussed in detail. We reviewed food nutrients ie protein, fats, simple and complex carbohydrates and how these affect insulin response. Focus on portion control,  avoiding simple carbohydrates and increasing lean protein  for ongoing wt loss efforts and glucose management  Heather Boone is on the following meal plan: journal food intake 1400 calories 75+ grams of protein. Her  meal plan was individualized for maximum benefit.  Also discussed at length the following behavioral modifications to help maximize  Success: increasing lean protein intake, decreasing simple carbohydrates, increasing vegetables, holiday eating strategies, keeping a strict food journal.    Heather Boone has been instructed to work up to a goal of 150 minutes of combined cardio and strengthening exercise per week for weight loss and overall health benefits.    Office: [phone]  /  Fax: [phone]  OBESITY BEHAVIORAL INTERVENTION VISIT  Today's visit was # 8 out of  22.  Starting weight: 231 lbs Starting date:  07/13/17 Today's weight : Weight: 217 lb (98.4 kg)  Today's date: 12/06/17 Total lbs lost to date: 14  (Patients must lose 7 lbs in the first 6 months to continue with counseling)   ASK: We discussed the diagnosis of obesity with Heather Boone today and Heather Boone agreed to give [place] permission to discuss obesity behavioral modification therapy today.  ASSESS: Heather Boone has the diagnosis of obesity and her BMI today is 38.45 Heather Boone is in the action stage of change   ADVISE: Heather Boone was educated on the multiple health risks of obesity as well as the benefit of weight loss to improve her health. She was advised of the need for long term treatment and the importance of lifestyle modifications.  AGREE: Multiple dietary modification options and treatment options were discussed and  Heather Boone agreed to keep a food journal with 1400 calories and 75+ g  protein  We discussed the following Behavioral Modification Stratagies today: increasing lean protein intake, decreasing simple carbohydrates , increasing vegetables and holiday eating strategies

## 2017-12-13 ENCOUNTER — Ambulatory Visit (INDEPENDENT_AMBULATORY_CARE_PROVIDER_SITE_OTHER): Payer: Commercial Managed Care - PPO | Admitting: Family Medicine

## 2017-12-13 VITALS — BP 109/68 | HR 68 | Temp 97.8°F | Ht 63.0 in | Wt 217.0 lb

## 2017-12-13 DIAGNOSIS — E559 Vitamin D deficiency, unspecified: Secondary | ICD-10-CM | POA: Diagnosis not present

## 2017-12-13 DIAGNOSIS — E8881 Metabolic syndrome: Secondary | ICD-10-CM | POA: Insufficient documentation

## 2017-12-13 DIAGNOSIS — Z9884 Bariatric surgery status: Secondary | ICD-10-CM | POA: Diagnosis not present

## 2017-12-13 DIAGNOSIS — Z9189 Other specified personal risk factors, not elsewhere classified: Secondary | ICD-10-CM | POA: Diagnosis not present

## 2017-12-13 DIAGNOSIS — Z6838 Body mass index (BMI) 38.0-38.9, adult: Secondary | ICD-10-CM

## 2017-12-13 MED ORDER — METFORMIN HCL 500 MG PO TABS: 500.0000 mg | ORAL_TABLET | Freq: Two times a day (BID) | ORAL | 0 refills | Status: DC

## 2017-12-13 MED ORDER — VITAMIN D (ERGOCALCIFEROL) 1.25 MG (50000 UNIT) PO CAPS: 50000.0000 [IU] | ORAL_CAPSULE | ORAL | 0 refills | Status: DC

## 2017-12-13 NOTE — Progress Notes (Signed)
Office: [phone]  /  Fax: [phone]   HPI:   Chief Complaint: OBESITY Heather Boone is here to discuss her progress with her obesity treatment plan. She is on the keep a food journal with 1400 calories and 75+ grams of protein daily and is following her eating plan approximately 85 % of the time. She states she is doing the belt program for 60 minutes 3 times per week. Heather Boone has done well maintaining her weight this December. She has had increase challenges but she is trying to decrease late snacking and increase lean protein.  Her weight is 217 lb (98.4 kg) today and has not lost weight since her last visit. She has lost 14 lbs since starting treatment with [place].  Heather Boone had status post gastric banding in December 2011.  Vitamin D deficiency Heather Boone has a diagnosis of vitamin D deficiency. She is stable on prescription Vit D, but not yet at goal. She denies nausea, vomiting or muscle weakness.  Insulin Resistance Heather Boone has a diagnosis of insulin resistance based on her elevated fasting insulin level >5. Although Heather Boone blood glucose readings are still under good control, insulin resistance puts her at greater risk of metabolic syndrome and diabetes. She is on metformin and she denies nausea, vomiting, or hypoglycemia. She is working on diet and decreasing simple carbohydrates and continues to work on exercise to decrease risk of diabetes.  At risk for diabetes Heather Boone is at higher than average risk for developing diabetes due to her obesity and insulin resistance. She currently denies polyuria or polydipsia.  ALLERGIES: Allergies  Allergen Reactions  . Aleve [Naproxen Sodium]   . Motrin [Ibuprofen]   . Oxycodone-Acetaminophen   . Tramadol     MEDICATIONS: Current Outpatient Medications on File Prior to Visit  Medication Sig Dispense Refill  . metFORMIN (GLUCOPHAGE) 500 MG tablet Take 1 tablet (500 mg total) 2 (two) times daily with a meal by mouth. 60 tablet 0  .  Vitamin D, Ergocalciferol, (DRISDOL) 50000 units CAPS capsule Take 1 capsule (50,000 Units total) every 7 (seven) days by mouth. 4 capsule 0   No current facility-administered medications on file prior to visit.     PAST MEDICAL HISTORY: Past Medical History:  Diagnosis Date  . Back pain   . Constipation   . Heartburn   . Obesity     PAST SURGICAL HISTORY: Past Surgical History:  Procedure Laterality Date  . LAPAROSCOPIC GASTRIC BANDING     dec 2011    SOCIAL HISTORY: Social History   Tobacco Use  . Smoking status: Never Smoker  . Smokeless tobacco: Never Used  Substance Use Topics  . Alcohol use: No  . Drug use: No    FAMILY HISTORY: Family History  Problem Relation Age of Onset  . Hyperlipidemia Mother   . Hyperlipidemia Father   . Heart disease Father   . Cancer Father   . Obesity Father     ROS: Review of Systems  Constitutional: Negative for weight loss.  Gastrointestinal: Negative for nausea and vomiting.  Genitourinary: Negative for frequency.  Musculoskeletal:       Negative muscle weakness  Endo/Heme/Allergies: Negative for polydipsia.       Negative hypoglycemia    PHYSICAL EXAM: Blood pressure 109/68, pulse 68, temperature 97.8 F (36.6 C), temperature source Oral, height 5\' 3"  (1.6 m), weight 217 lb (98.4 kg), last menstrual period 11/21/2017, SpO2 99 %. Body mass index is 38.44 kg/m. Physical Exam  Constitutional: She is oriented to person, place,  and time. She appears well-developed and well-nourished.  Cardiovascular: Normal rate.  Pulmonary/Chest: Effort normal.  Musculoskeletal: Normal range of motion.  Neurological: She is oriented to person, place, and time.  Skin: Skin is warm and dry.  Psychiatric: She has a normal mood and affect. Her behavior is normal.  Vitals reviewed.   RECENT LABS AND TESTS: BMET    Component Value Date/Time   NA 142 11/01/2017 0804   K 4.2 11/01/2017 0804   CL 107 (H) 11/01/2017 0804   CO2 24  11/01/2017 0804   GLUCOSE 90 11/01/2017 0804   GLUCOSE 93 01/13/2010 0220   BUN 10 11/01/2017 0804   CREATININE 0.62 11/01/2017 0804   CALCIUM 9.2 11/01/2017 0804   GFRNONAA 119 11/01/2017 0804   GFRAA 137 11/01/2017 0804   Lab Results  Component Value Date   HGBA1C 5.1 11/01/2017   HGBA1C 5.5 07/13/2017   Lab Results  Component Value Date   INSULIN 14.2 11/01/2017   INSULIN 11.0 07/13/2017   CBC    Component Value Date/Time   WBC 7.3 07/13/2017 1030   WBC 12.2 (H) 01/13/2010 0205   RBC 4.52 07/13/2017 1030   RBC 5.05 01/13/2010 0205   HGB 12.0 07/13/2017 1030   HCT 38.2 07/13/2017 1030   PLT 383 01/13/2010 0205   MCV 85 07/13/2017 1030   MCH 26.5 (L) 07/13/2017 1030   MCHC 31.4 (L) 07/13/2017 1030   MCHC 33.9 01/13/2010 0205   RDW 14.3 07/13/2017 1030   LYMPHSABS 1.7 07/13/2017 1030   MONOABS 0.7 01/13/2010 0205   EOSABS 0.1 07/13/2017 1030   BASOSABS 0.0 07/13/2017 1030   Iron/TIBC/Ferritin/ %Sat No results found for: IRON, TIBC, FERRITIN, IRONPCTSAT Lipid Panel     Component Value Date/Time   CHOL 135 07/13/2017 1030   TRIG 54 07/13/2017 1030   HDL 61 07/13/2017 1030   LDLCALC 63 07/13/2017 1030   Hepatic Function Panel     Component Value Date/Time   PROT 6.4 11/01/2017 0804   ALBUMIN 3.6 11/01/2017 0804   AST 15 11/01/2017 0804   ALT 12 11/01/2017 0804   ALKPHOS 76 11/01/2017 0804   BILITOT 0.3 11/01/2017 0804   BILIDIR 0.2 01/13/2010 0205   IBILI 0.8 01/13/2010 0205      Component Value Date/Time   TSH 1.650 07/13/2017 1030    ASSESSMENT AND PLAN: Insulin resistance - Plan: metFORMIN (GLUCOPHAGE) 500 MG tablet  Vitamin D deficiency - Plan: Vitamin D, Ergocalciferol, (DRISDOL) 50000 units CAPS capsule  Status post gastric banding  At risk for diabetes mellitus  Class 2 severe obesity with serious comorbidity and body mass index (BMI) of 38.0 to 38.9 in adult, unspecified obesity type ([place])  PLAN:  Vitamin D Deficiency Heather Boone was  informed that low vitamin D levels contributes to fatigue and are associated with obesity, breast, and colon cancer. Heather Boone agrees to continue taking prescription Vit D @50 ,000 IU every week #4 and we will refill for 1 month. She will follow up for routine testing of vitamin D, at least 2-3 times per year. She was informed of the risk of over-replacement of vitamin D and agrees to not increase her dose unless he discusses this with [place] first. Heather Boone agrees to follow up with our clinic in 3 weeks.  Insulin Resistance Heather Boone will continue to work on weight loss, exercise, and decreasing simple carbohydrates in her diet to help decrease the risk of diabetes. We dicussed metformin including benefits and risks. She was informed that eating too many  simple carbohydrates or too many calories at one sitting increases the likelihood of GI side effects. Heather Boone agrees to continue taking metformin 500 mg BID #60 and we will refill for 1 month. Heather Boone agrees to follow up with our clinic in 3 weeks as directed to monitor her progress.  Diabetes risk counselling Heather Boone was given extended (15 minutes) diabetes prevention counseling today. She is 36 y.o. female and has risk factors for diabetes including obesity and insulin resistance. We discussed intensive lifestyle modifications today with an emphasis on weight loss as well as increasing exercise and decreasing simple carbohydrates in her diet.  Obesity Heather Boone is currently in the action stage of change. As such, her goal is to continue with weight loss efforts She has agreed to change to portion control better and make smarter food choices, such as increase vegetables and decrease simple carbohydrates  Heather Boone has been instructed to work up to a goal of 150 minutes of combined cardio and strengthening exercise per week for weight loss and overall health benefits. We discussed the following Behavioral Modification Strategies today: holiday eating  strategies, decrease junk food and decrease liquid calories   Paul has agreed to follow up with our clinic in 3 weeks. She was informed of the importance of frequent follow up visits to maximize her success with intensive lifestyle modifications for her multiple health conditions.  I, Trixie Dredge, am acting as transcriptionist for Dennard Nip, MD  I have reviewed the above documentation for accuracy and completeness, and I agree with the above. -Dennard Nip, MD     Today's visit was # 8 out of 22.  Starting weight: 231 lbs Starting date: 07/13/17 Today's weight : 217 lbs  Today's date: 12/13/2017 Total lbs lost to date: 14 (Patients must lose 7 lbs in the first 6 months to continue with counseling)   ASK: We discussed the diagnosis of obesity with Heather Boone today and Heather Boone agreed to give [place] permission to discuss obesity behavioral modification therapy today.  ASSESS: Heather Boone has the diagnosis of obesity and her BMI today is 38.45 Heather Boone is in the action stage of change   ADVISE: Heather Boone was educated on the multiple health risks of obesity as well as the benefit of weight loss to improve her health. She was advised of the need for long term treatment and the importance of lifestyle modifications.  AGREE: Multiple dietary modification options and treatment options were discussed and  Heather Boone agreed to portion control better and make smarter food choices, such as increase vegetables and decrease simple carbohydrates  We discussed the following Behavioral Modification Strategies today: holiday eating strategies, decrease junk food and decrease liquid calories

## 2018-01-03 ENCOUNTER — Ambulatory Visit (INDEPENDENT_AMBULATORY_CARE_PROVIDER_SITE_OTHER): Payer: Commercial Managed Care - PPO | Admitting: Family Medicine

## 2018-01-03 VITALS — BP 115/74 | HR 66 | Temp 98.2°F | Ht 63.0 in | Wt 216.0 lb

## 2018-01-03 DIAGNOSIS — E559 Vitamin D deficiency, unspecified: Secondary | ICD-10-CM | POA: Diagnosis not present

## 2018-01-03 DIAGNOSIS — E8881 Metabolic syndrome: Secondary | ICD-10-CM

## 2018-01-03 DIAGNOSIS — Z6838 Body mass index (BMI) 38.0-38.9, adult: Secondary | ICD-10-CM | POA: Diagnosis not present

## 2018-01-03 MED ORDER — VITAMIN D (ERGOCALCIFEROL) 1.25 MG (50000 UNIT) PO CAPS: 50000.0000 [IU] | ORAL_CAPSULE | ORAL | 0 refills | Status: DC

## 2018-01-03 MED ORDER — METFORMIN HCL 500 MG PO TABS: 500.0000 mg | ORAL_TABLET | Freq: Two times a day (BID) | ORAL | 0 refills | Status: DC

## 2018-01-03 NOTE — Progress Notes (Signed)
Office: [phone]  /  Fax: [phone]   HPI:   Chief Complaint: OBESITY Heather Boone is here to discuss her progress with her obesity treatment plan. She is on the portion control better and make smarter food choices, such as increase vegetables and decrease simple carbohydrates  and is following her eating plan approximately 60 % of the time. She states she is walking and belt program for 60 minutes 3 times per week. Heather Boone did well continuing weight loss even over the holidays and she is exercising regularly. She is status post gastric banding surgery. Her weight is 216 lb (98 kg) today and has had a weight loss of 1 pound over a period of 3 weeks since her last visit. She has lost 15 lbs since starting treatment with [place].  Insulin Resistance Heather Boone has a diagnosis of insulin resistance based on her elevated fasting insulin level >5. Although Heather Boone's blood glucose readings are still under good control, insulin resistance puts her at greater risk of metabolic syndrome and diabetes. Heather Boone is still drinking juice but doing well otherwise. She denies nausea, vomiting, or hypoglycemia, but she notes some evening polyphagia. She is taking metformin currently and continues to work on diet and exercise to decrease risk of diabetes.  Vitamin D deficiency Heather Boone has a diagnosis of vitamin D deficiency. She is stable on prescription Vit D, but not yet at goal. She denies nausea, vomiting or muscle weakness.  ALLERGIES: Allergies  Allergen Reactions  . Aleve [Naproxen Sodium]   . Motrin [Ibuprofen]   . Oxycodone-Acetaminophen   . Tramadol     MEDICATIONS: Current Outpatient Medications on File Prior to Visit  Medication Sig Dispense Refill  . metFORMIN (GLUCOPHAGE) 500 MG tablet Take 1 tablet (500 mg total) by mouth 2 (two) times daily with a meal. 60 tablet 0  . Vitamin D, Ergocalciferol, (DRISDOL) 50000 units CAPS capsule Take 1 capsule (50,000 Units total) by mouth every 7  (seven) days. 4 capsule 0   No current facility-administered medications on file prior to visit.     PAST MEDICAL HISTORY: Past Medical History:  Diagnosis Date  . Back pain   . Constipation   . Heartburn   . Obesity     PAST SURGICAL HISTORY: Past Surgical History:  Procedure Laterality Date  . LAPAROSCOPIC GASTRIC BANDING     dec 2011    SOCIAL HISTORY: Social History   Tobacco Use  . Smoking status: Never Smoker  . Smokeless tobacco: Never Used  Substance Use Topics  . Alcohol use: No  . Drug use: No    FAMILY HISTORY: Family History  Problem Relation Age of Onset  . Hyperlipidemia Mother   . Hyperlipidemia Father   . Heart disease Father   . Cancer Father   . Obesity Father     ROS: Review of Systems  Constitutional: Positive for weight loss.  Gastrointestinal: Negative for nausea and vomiting.  Musculoskeletal:       Negative muscle weakness  Endo/Heme/Allergies:       Positive polyphagia Negative hypoglycemia    PHYSICAL EXAM: Blood pressure 115/74, pulse 66, temperature 98.2 F (36.8 C), temperature source Oral, height 5\' 3"  (1.6 m), weight 216 lb (98 kg), last menstrual period 12/18/2017, SpO2 100 %. Body mass index is 38.26 kg/m. Physical Exam  Constitutional: She is oriented to person, place, and time. She appears well-developed and well-nourished.  Cardiovascular: Normal rate.  Pulmonary/Chest: Effort normal.  Musculoskeletal: Normal range of motion.  Neurological: She is oriented to  person, place, and time.  Skin: Skin is warm and dry.  Psychiatric: She has a normal mood and affect. Her behavior is normal.  Vitals reviewed.   RECENT LABS AND TESTS: BMET    Component Value Date/Time   NA 142 11/01/2017 0804   K 4.2 11/01/2017 0804   CL 107 (H) 11/01/2017 0804   CO2 24 11/01/2017 0804   GLUCOSE 90 11/01/2017 0804   GLUCOSE 93 01/13/2010 0220   BUN 10 11/01/2017 0804   CREATININE 0.62 11/01/2017 0804   CALCIUM 9.2 11/01/2017  0804   GFRNONAA 119 11/01/2017 0804   GFRAA 137 11/01/2017 0804   Lab Results  Component Value Date   HGBA1C 5.1 11/01/2017   HGBA1C 5.5 07/13/2017   Lab Results  Component Value Date   INSULIN 14.2 11/01/2017   INSULIN 11.0 07/13/2017   CBC    Component Value Date/Time   WBC 7.3 07/13/2017 1030   WBC 12.2 (H) 01/13/2010 0205   RBC 4.52 07/13/2017 1030   RBC 5.05 01/13/2010 0205   HGB 12.0 07/13/2017 1030   HCT 38.2 07/13/2017 1030   PLT 383 01/13/2010 0205   MCV 85 07/13/2017 1030   MCH 26.5 (L) 07/13/2017 1030   MCHC 31.4 (L) 07/13/2017 1030   MCHC 33.9 01/13/2010 0205   RDW 14.3 07/13/2017 1030   LYMPHSABS 1.7 07/13/2017 1030   MONOABS 0.7 01/13/2010 0205   EOSABS 0.1 07/13/2017 1030   BASOSABS 0.0 07/13/2017 1030   Iron/TIBC/Ferritin/ %Sat No results found for: IRON, TIBC, FERRITIN, IRONPCTSAT Lipid Panel     Component Value Date/Time   CHOL 135 07/13/2017 1030   TRIG 54 07/13/2017 1030   HDL 61 07/13/2017 1030   LDLCALC 63 07/13/2017 1030   Hepatic Function Panel     Component Value Date/Time   PROT 6.4 11/01/2017 0804   ALBUMIN 3.6 11/01/2017 0804   AST 15 11/01/2017 0804   ALT 12 11/01/2017 0804   ALKPHOS 76 11/01/2017 0804   BILITOT 0.3 11/01/2017 0804   BILIDIR 0.2 01/13/2010 0205   IBILI 0.8 01/13/2010 0205      Component Value Date/Time   TSH 1.650 07/13/2017 1030  Results for MANVIR, THORSON (MRN [id number]) as of 01/03/2018 16:10  Ref. Range 11/01/2017 08:04  Vitamin D, 25-Hydroxy Latest Ref Range: 30.0 - 100.0 ng/mL 38.9    ASSESSMENT AND PLAN: Insulin resistance - Plan: metFORMIN (GLUCOPHAGE) 500 MG tablet  Vitamin D deficiency - Plan: Vitamin D, Ergocalciferol, (DRISDOL) 50000 units CAPS capsule  Class 2 severe obesity with serious comorbidity and body mass index (BMI) of 38.0 to 38.9 in adult, unspecified obesity type ([place])  PLAN:  Insulin Resistance Heather Boone will continue to work on weight loss, exercise, and decreasing simple  carbohydrates in her diet to help decrease the risk of diabetes. We dicussed metformin including benefits and risks. She was informed that eating too many simple carbohydrates or too many calories at one sitting increases the likelihood of GI side effects. Heather Boone agrees to continue taking metformin 500 mg BID #60 and we will refill for 1 month. She will increase morning protein to help with polyphagia. Heather Boone agrees to follow up with our clinic in 2 weeks with our dietitian and follow up in 4 weeks with myself as directed to monitor her progress.  Vitamin D Deficiency Heather Boone was informed that low vitamin D levels contributes to fatigue and are associated with obesity, breast, and colon cancer. Heather Boone agrees to continue taking prescription Vit D @50 ,000 IU every  week #4 and we will refill for 1 month. She will follow up for routine testing of vitamin D, at least 2-3 times per year. She was informed of the risk of over-replacement of vitamin D and agrees to not increase her dose unless he discusses this with [place] first. Heather Boone agrees to follow up with our clinic in 2 weeks with our dietitian and follow up in 4 weeks with myself.  Obesity Heather Boone is currently in the action stage of change. As such, her goal is to continue with weight loss efforts She has agreed to keep a food journal with 1400 calories and 75+ grams of protein daily Heather Boone has been instructed to work up to a goal of 150 minutes of combined cardio and strengthening exercise per week or continue exercise as is for weight loss and overall health benefits. We discussed the following Behavioral Modification Strategies today: increasing lean protein intake and decrease liquid calories   Heather Boone has agreed to follow up with our clinic in 2 weeks with our dietitian and follow up in 4 weeks with myself. She was informed of the importance of frequent follow up visits to maximize her success with intensive lifestyle modifications for her  multiple health conditions.   OBESITY BEHAVIORAL INTERVENTION VISIT  Today's visit was # 9 out of 22.  Starting weight: 231 lbs Starting date: 07/13/17 Today's weight : 216 lbs  Today's date: 01/03/2018 Total lbs lost to date: 15 (Patients must lose 7 lbs in the first 6 months to continue with counseling)   ASK: We discussed the diagnosis of obesity with Heather Boone today and Heather Boone agreed to give [place] permission to discuss obesity behavioral modification therapy today.  ASSESS: Heather Boone has the diagnosis of obesity and her BMI today is 38.27 Heather Boone is in the action stage of change   ADVISE: Heather Boone was educated on the multiple health risks of obesity as well as the benefit of weight loss to improve her health. She was advised of the need for long term treatment and the importance of lifestyle modifications.  AGREE: Multiple dietary modification options and treatment options were discussed and  Heather Boone agreed to the above obesity treatment plan.  I, Trixie Dredge, am acting as transcriptionist for Dennard Nip, MD  I have reviewed the above documentation for accuracy and completeness, and I agree with the above. -Dennard Nip, MD

## 2018-01-14 IMAGING — RF DG UGI W/ KUB
11 series · 16 of 24 positions shown · non-contrast
Comparison: Upper GI dated 06/13/2005

CLINICAL DATA: Morbid obesity.  Lap band in place.

EXAM:
UPPER GI SERIES WITH KUB
TECHNIQUE: After obtaining a scout radiograph a routine upper GI series was
performed using high density barium and effervescent crystals.
FLUOROSCOPY TIME:  Fluoroscopy Time:  1 minutes 36 seconds

[Series 1: t abdomen supine · 0.15mm/px · 1 of 1 slices shown]
[im 1/1]
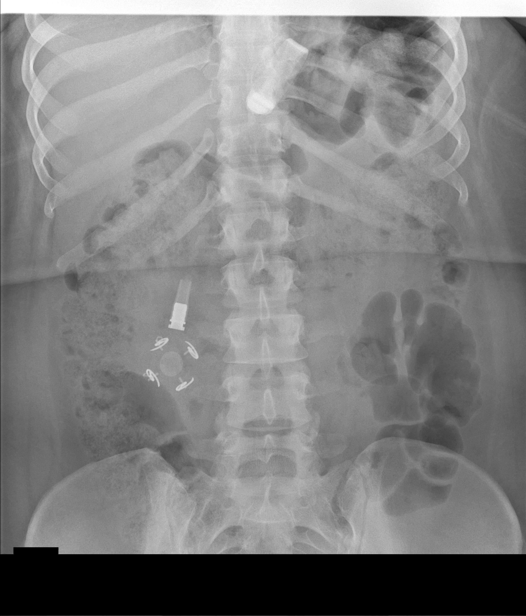

[Series 2: fluoro_barium 2fps_bw · 0.18mm/px · 1 of 2 frames shown (1 of 9)]
[frame 2/2]
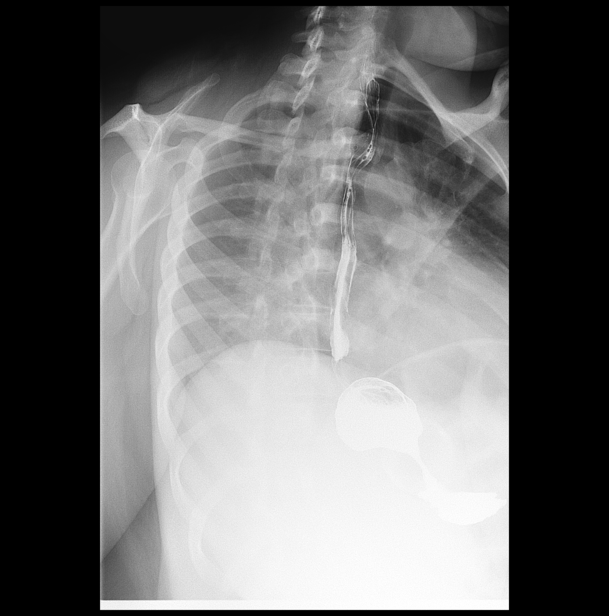

[Series 3: fluoro_barium 2fps_bw · 0.19mm/px · 1 of 2 frames shown (2 of 9)]
[frame 1/2]
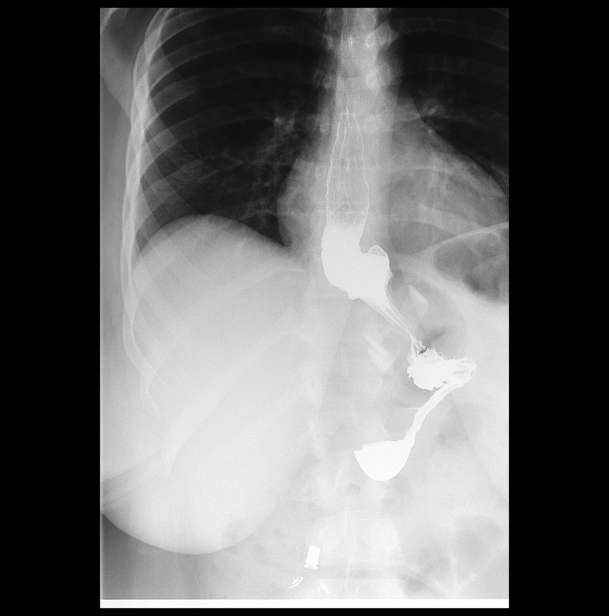

[Series 4: cp_standard · 0.57mm/px · 3 of 135 frames shown]
[frame 21/135]
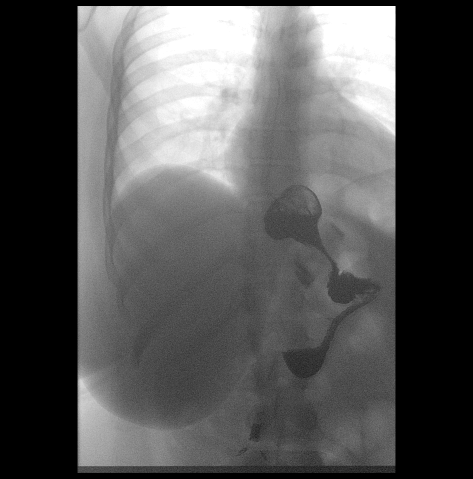
[frame 68/135]
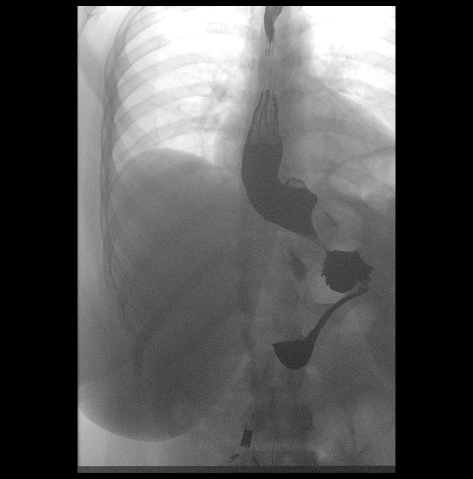
[frame 130/135]
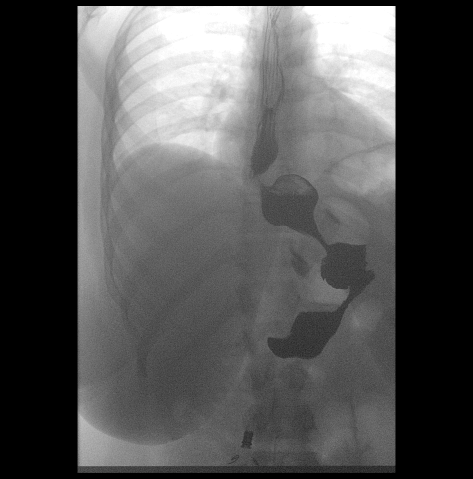

[Series 5: fluoro_barium 2fps_bw · 0.19mm/px · 1 of 2 frames shown (3 of 9)]
[frame 1/2]
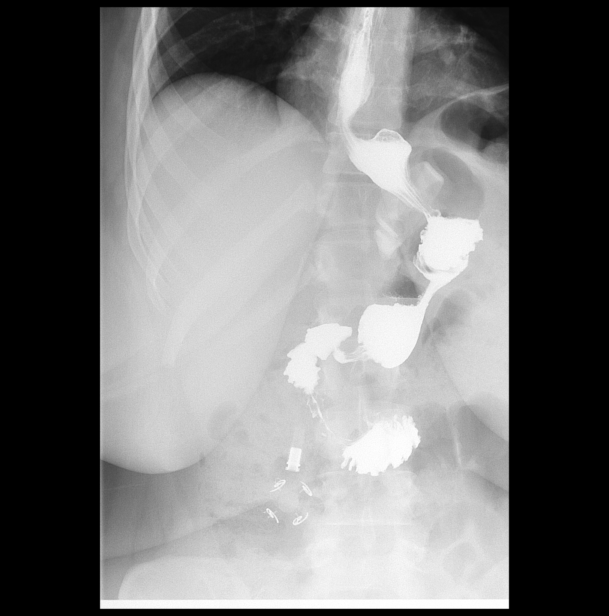

[Series 6: fluoro_barium 2fps_bw · 0.20mm/px · 2 of 2 frames shown (4 of 9)]
[frame 1/2]
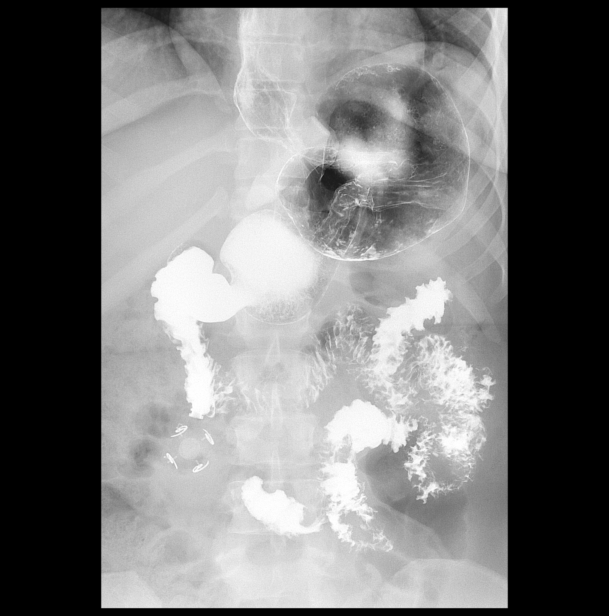
[frame 2/2]
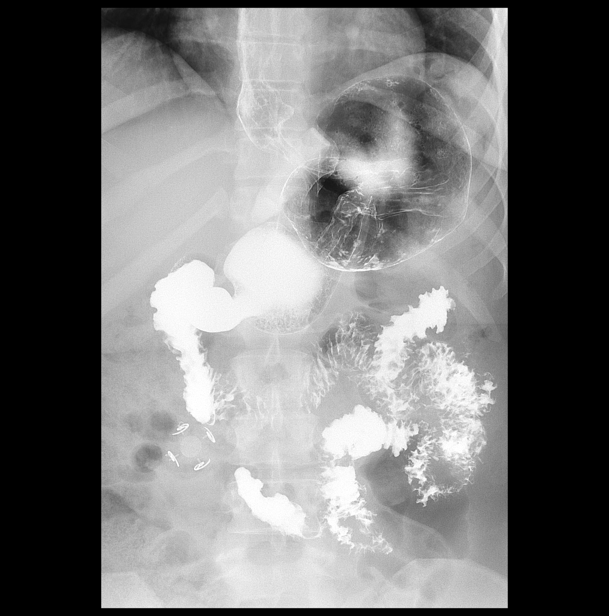

[Series 7: fluoro_barium 2fps_bw · 0.21mm/px · 1 of 2 frames shown (5 of 9)]
[frame 2/2]
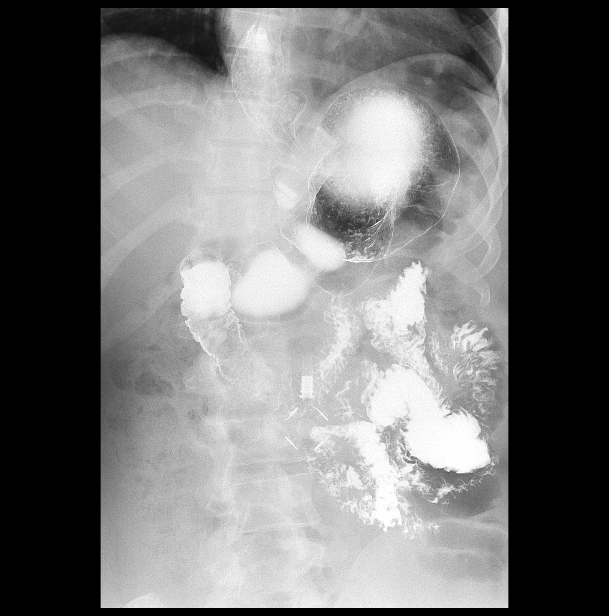

[Series 8: fluoro_barium 2fps_bw · 0.20mm/px · 1 of 2 frames shown (6 of 9)]
[frame 1/2]
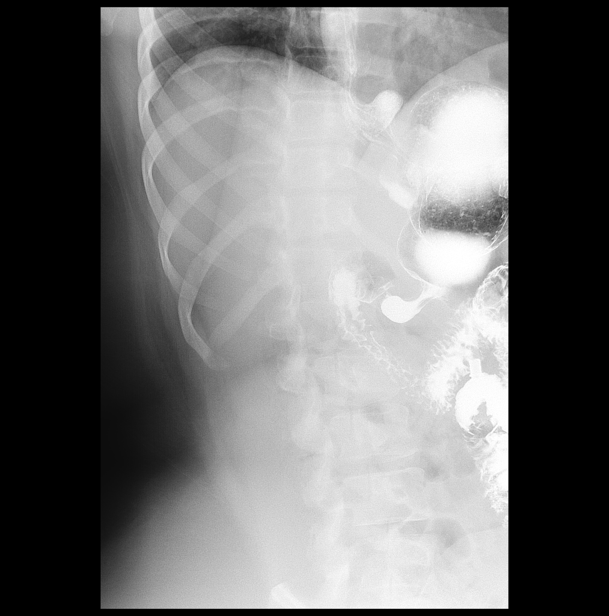

[Series 9: fluoro_barium 2fps_bw · 0.20mm/px · 2 of 3 frames shown (7 of 9)]
[frame 1/3]
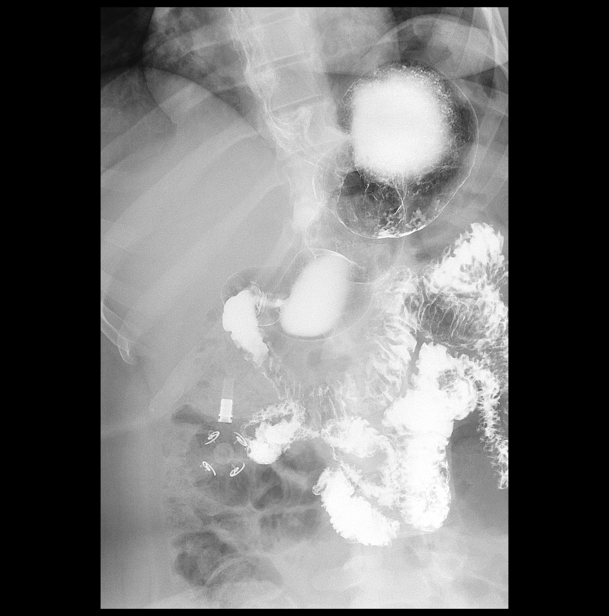
[frame 2/3]
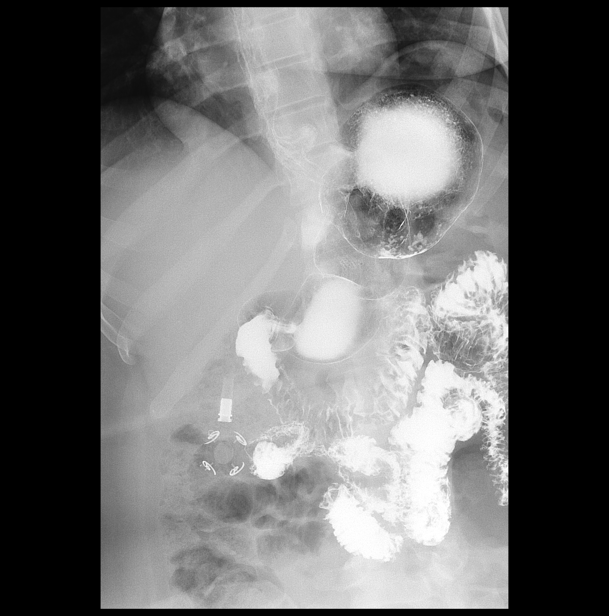

[Series 10: fluoro_barium 2fps_bw · 0.20mm/px · 2 of 2 frames shown (8 of 9)]
[frame 1/2]
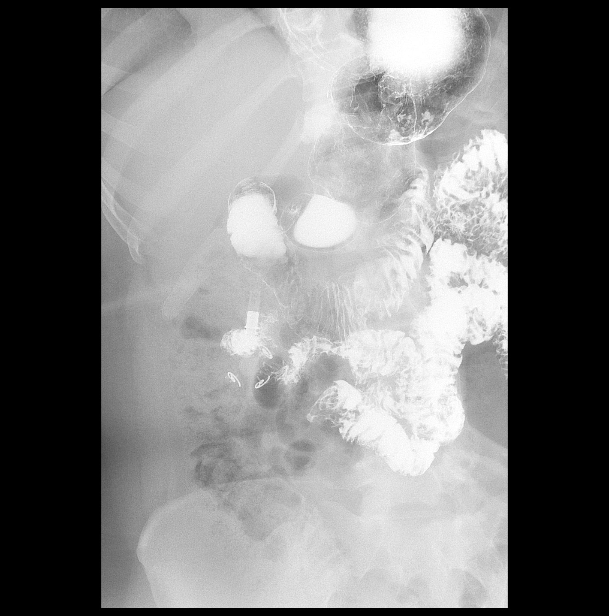
[frame 2/2]
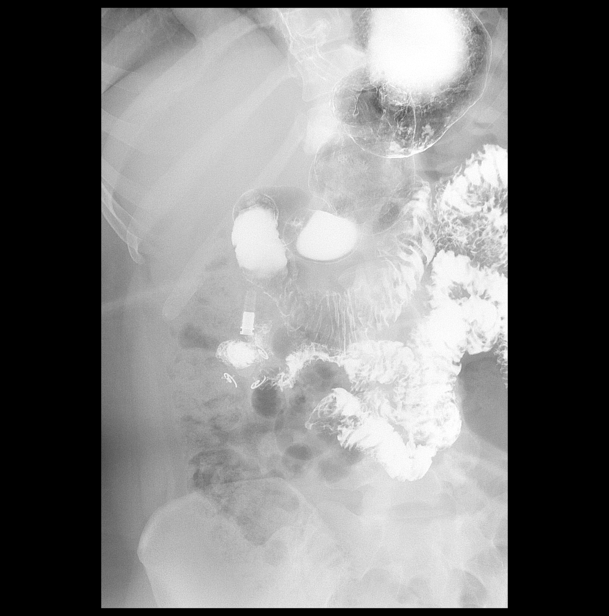

[Series 11: fluoro_barium 2fps_bw · 0.20mm/px · 1 of 2 frames shown (9 of 9)]
[frame 2/2]
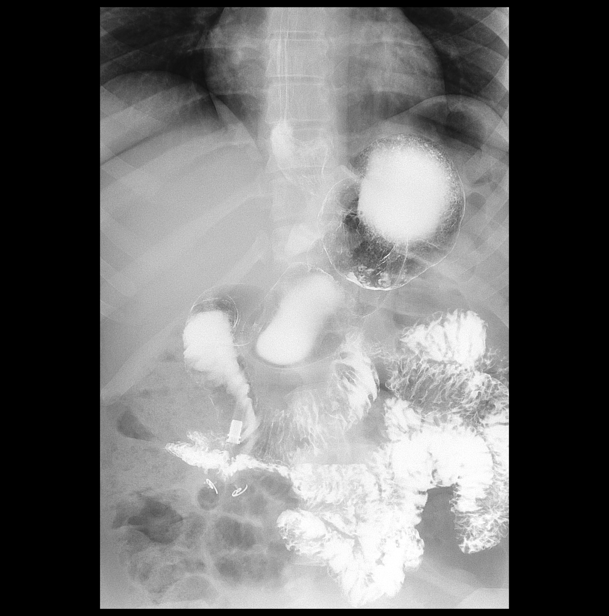

[16 of 24 positions shown; findings below may reference images not displayed]

FINDINGS: The KUB demonstrates at the lap band has normal orientation and
position at the gastroesophageal junction. There is slight
dilatation of the esophagus just above the lap band. Contrast flows
freely through the lap band but there is narrowing of the contrast
column through the lap band. The fundus, body, and antrum of the
stomach are normal. The pylorus and duodenal bulb and C-loop are
also normal.
IMPRESSION: 1. Lap band in place in good position. There is narrowing of the
contrast column going through the lap band to the expected degree.
2. Otherwise normal upper GI.

## 2018-01-17 ENCOUNTER — Ambulatory Visit (INDEPENDENT_AMBULATORY_CARE_PROVIDER_SITE_OTHER): Payer: Commercial Managed Care - PPO | Admitting: Dietician

## 2018-01-17 ENCOUNTER — Encounter (INDEPENDENT_AMBULATORY_CARE_PROVIDER_SITE_OTHER): Payer: Self-pay

## 2018-01-24 ENCOUNTER — Ambulatory Visit (INDEPENDENT_AMBULATORY_CARE_PROVIDER_SITE_OTHER): Payer: Commercial Managed Care - PPO | Admitting: Dietician

## 2018-01-24 VITALS — Ht 63.0 in | Wt 214.0 lb

## 2018-01-24 DIAGNOSIS — E8881 Metabolic syndrome: Secondary | ICD-10-CM | POA: Diagnosis not present

## 2018-01-24 DIAGNOSIS — Z6837 Body mass index (BMI) 37.0-37.9, adult: Secondary | ICD-10-CM

## 2018-01-24 DIAGNOSIS — Z9189 Other specified personal risk factors, not elsewhere classified: Secondary | ICD-10-CM | POA: Diagnosis not present

## 2018-01-24 NOTE — Progress Notes (Signed)
  Office: [phone]  /  Fax: [phone]     Heather Boone has a diagnosis of insulin resistance based on her elevated fasting insulin level >5. Although Heather Boone blood glucose readings are still under good control, insulin resistance puts her at greater risk of metabolic syndrome and diabetes. She is here today for diabetic risk nutrition counseling which includes her obesity treatment plan.  Heather Boone weight today is 214 lbs, a 2 lb weight loss since her last visit. She has had a weight loss of 17 lbs since beginning treatment with [place]. She is s/p gastric banding 2011. Her heaviest weight was 310lbs.   She states she feels like she is back on track after the holidays. She reports she has not been journaling her food intake consistently recently but states she is going to start journaling 100% moving forward. She reports that although she hasn't been journaling consistently she has been mindful of making sure she reached her protein goal. Her nutrition goals are 1400 calories and 75+ grams of protein. Reviewed nutrients ie protein, fats, simple and complex carbohydrates and how these affect insulin response with meal ideas provided. She reports her hunger has been well controlled over the past 2 weeks.   Heather Boone is on the following meal plan: 1400 calories 75+ grams of protein. Her meal plan was individualized for maximum benefit.  Also discussed at length the following behavioral modifications to help maximize success: increasing lean protein intake, decreasing simple carbohydrates, increasing vegetables, meal planning and cooking strategies,  keeping a strict food journal.    Heather Boone has been instructed to work up to a goal of 150 minutes of combined cardio and strengthening exercise per week for weight loss and overall health benefits.    Office: [phone]  /  Fax: [phone]  OBESITY BEHAVIORAL INTERVENTION VISIT  Today's visit was # 10 out of 22.  Starting weight: 231 Starting  date: 07/13/17 Today's weight : Weight: 214 lb (97.1 kg)  Today's date: 01/24/2018 Total lbs lost to date: 17 (Patients must lose 7 lbs in the first 6 months to continue with counseling)   ASK: We discussed the diagnosis of obesity with Heather Boone today and Heather Boone agreed to give [place] permission to discuss obesity behavioral modification therapy today.  ASSESS: Heather Boone has the diagnosis of obesity and her BMI today is 37.92 Heather Boone is in the action stage of change   ADVISE: Heather Boone was educated on the multiple health risks of obesity as well as the benefit of weight loss to improve her health. She was advised of the need for long term treatment and the importance of lifestyle modifications.  AGREE: Multiple dietary modification options and treatment options were discussed and  Heather Boone agreed to keep a food journal with 1400 calories and 75+ protein  We discussed the following Behavioral Modification Stratagies today: increasing lean protein intake, decreasing simple carbohydrates  and work on meal planning and easy cooking plans

## 2018-01-31 ENCOUNTER — Ambulatory Visit (INDEPENDENT_AMBULATORY_CARE_PROVIDER_SITE_OTHER): Payer: Commercial Managed Care - PPO | Admitting: Family Medicine

## 2018-01-31 VITALS — BP 109/69 | HR 61 | Temp 98.2°F | Ht 63.0 in | Wt 217.0 lb

## 2018-01-31 DIAGNOSIS — Z6838 Body mass index (BMI) 38.0-38.9, adult: Secondary | ICD-10-CM

## 2018-01-31 DIAGNOSIS — Z9189 Other specified personal risk factors, not elsewhere classified: Secondary | ICD-10-CM

## 2018-01-31 DIAGNOSIS — F3289 Other specified depressive episodes: Secondary | ICD-10-CM

## 2018-01-31 DIAGNOSIS — E8881 Metabolic syndrome: Secondary | ICD-10-CM

## 2018-01-31 MED ORDER — BUPROPION HCL ER (SR) 200 MG PO TB12: 200.0000 mg | ORAL_TABLET | Freq: Every day | ORAL | 0 refills | Status: DC

## 2018-01-31 NOTE — Progress Notes (Signed)
Office: [phone]  /  Fax: [phone]   HPI:   Chief Complaint: OBESITY Heather Boone is here to discuss her progress with her obesity treatment plan. She is on the keep a food journal with 1400 calories and 75+ grams of protein daily and is following her eating plan approximately 85 % of the time. She states she is walking 45 minutes 3 times per week. Heather Boone is retaining fluid, but has increased eating out. She is trying to make better choices. Her weight is 217 lb (98.4 kg) today and has had a weight loss of 3 pounds over a period of 1 week since her last visit. She has lost 14 lbs since starting treatment with [place].  Status Post Lap Band  Insulin Resistance Heather Boone has a diagnosis of insulin resistance based on her elevated fasting insulin level >5. Although Heather Boone's blood glucose readings are still under good control, insulin resistance puts her at greater risk of metabolic syndrome and diabetes. She is doing well on metformin and she denies nausea or vomiting. Overall she is eating healthier, but she is still making some dietary indiscretions. Wisdom continues to work on diet and exercise to decrease risk of diabetes.  At risk for diabetes Heather Boone is at higher than average risk for developing diabetes due to her obesity and insulin resistance. She currently denies polyuria or polydipsia.  Depression with emotional eating behaviors Heather Boone notes she is struggling with emotional eating. Heather Boone struggles with emotional eating and using food for comfort to the extent that it is negatively impacting her health. She often snacks when she is not hungry. Heather Boone sometimes feels she is out of control and then feels guilty that she made poor food choices. She has been working on behavior modification techniques to help reduce her emotional eating and has been somewhat successful. Her mood is stable and she shows no sign of suicidal or homicidal ideations. She has no history of seizures  and her blood pressure is stable.  Depression screen PHQ 2/9 07/13/2017  Decreased Interest 3  Down, Depressed, Hopeless 1  PHQ - 2 Score 4  Altered sleeping 3  Tired, decreased energy 3  Change in appetite 2  Feeling bad or failure about yourself  0  Trouble concentrating 0  Moving slowly or fidgety/restless 0  Suicidal thoughts 0  PHQ-9 Score 12     ALLERGIES: Allergies  Allergen Reactions  . Aleve [Naproxen Sodium]   . Motrin [Ibuprofen]   . Oxycodone-Acetaminophen   . Tramadol     MEDICATIONS: Current Outpatient Medications on File Prior to Visit  Medication Sig Dispense Refill  . metFORMIN (GLUCOPHAGE) 500 MG tablet Take 1 tablet (500 mg total) by mouth 2 (two) times daily with a meal. 60 tablet 0  . Vitamin D, Ergocalciferol, (DRISDOL) 50000 units CAPS capsule Take 1 capsule (50,000 Units total) by mouth every 7 (seven) days. 4 capsule 0   No current facility-administered medications on file prior to visit.     PAST MEDICAL HISTORY: Past Medical History:  Diagnosis Date  . Back pain   . Constipation   . Heartburn   . Obesity     PAST SURGICAL HISTORY: Past Surgical History:  Procedure Laterality Date  . LAPAROSCOPIC GASTRIC BANDING     dec 2011    SOCIAL HISTORY: Social History   Tobacco Use  . Smoking status: Never Smoker  . Smokeless tobacco: Never Used  Substance Use Topics  . Alcohol use: No  . Drug use: No  FAMILY HISTORY: Family History  Problem Relation Age of Onset  . Hyperlipidemia Mother   . Hyperlipidemia Father   . Heart disease Father   . Cancer Father   . Obesity Father     ROS: Review of Systems  Constitutional: Negative for malaise/fatigue.  Gastrointestinal: Negative for nausea and vomiting.  Genitourinary: Negative for frequency.  Endo/Heme/Allergies: Negative for polydipsia.  Psychiatric/Behavioral: Positive for depression. Negative for suicidal ideas.    PHYSICAL EXAM: Blood pressure 109/69, pulse 61,  temperature 98.2 F (36.8 C), temperature source Oral, height 5\' 3"  (1.6 m), weight 217 lb (98.4 kg), SpO2 97 %. Body mass index is 38.44 kg/m. Physical Exam  Constitutional: She is oriented to person, place, and time. She appears well-developed and well-nourished.  Cardiovascular: Normal rate.  Pulmonary/Chest: Effort normal.  Musculoskeletal: Normal range of motion.  Neurological: She is oriented to person, place, and time.  Skin: Skin is warm and dry.  Psychiatric: She has a normal mood and affect. Her behavior is normal.  Vitals reviewed.   RECENT LABS AND TESTS: BMET    Component Value Date/Time   NA 142 11/01/2017 0804   K 4.2 11/01/2017 0804   CL 107 (H) 11/01/2017 0804   CO2 24 11/01/2017 0804   GLUCOSE 90 11/01/2017 0804   GLUCOSE 93 01/13/2010 0220   BUN 10 11/01/2017 0804   CREATININE 0.62 11/01/2017 0804   CALCIUM 9.2 11/01/2017 0804   GFRNONAA 119 11/01/2017 0804   GFRAA 137 11/01/2017 0804   Lab Results  Component Value Date   HGBA1C 5.1 11/01/2017   HGBA1C 5.5 07/13/2017   Lab Results  Component Value Date   INSULIN 14.2 11/01/2017   INSULIN 11.0 07/13/2017   CBC    Component Value Date/Time   WBC 7.3 07/13/2017 1030   WBC 12.2 (H) 01/13/2010 0205   RBC 4.52 07/13/2017 1030   RBC 5.05 01/13/2010 0205   HGB 12.0 07/13/2017 1030   HCT 38.2 07/13/2017 1030   PLT 383 01/13/2010 0205   MCV 85 07/13/2017 1030   MCH 26.5 (L) 07/13/2017 1030   MCHC 31.4 (L) 07/13/2017 1030   MCHC 33.9 01/13/2010 0205   RDW 14.3 07/13/2017 1030   LYMPHSABS 1.7 07/13/2017 1030   MONOABS 0.7 01/13/2010 0205   EOSABS 0.1 07/13/2017 1030   BASOSABS 0.0 07/13/2017 1030   Iron/TIBC/Ferritin/ %Sat No results found for: IRON, TIBC, FERRITIN, IRONPCTSAT Lipid Panel     Component Value Date/Time   CHOL 135 07/13/2017 1030   TRIG 54 07/13/2017 1030   HDL 61 07/13/2017 1030   LDLCALC 63 07/13/2017 1030   Hepatic Function Panel     Component Value Date/Time   PROT  6.4 11/01/2017 0804   ALBUMIN 3.6 11/01/2017 0804   AST 15 11/01/2017 0804   ALT 12 11/01/2017 0804   ALKPHOS 76 11/01/2017 0804   BILITOT 0.3 11/01/2017 0804   BILIDIR 0.2 01/13/2010 0205   IBILI 0.8 01/13/2010 0205      Component Value Date/Time   TSH 1.650 07/13/2017 1030    ASSESSMENT AND PLAN: Insulin resistance  Other depression - with emotional eating - Plan: buPROPion (WELLBUTRIN SR) 200 MG 12 hr tablet  At risk for diabetes mellitus  Class 2 severe obesity with serious comorbidity and body mass index (BMI) of 38.0 to 38.9 in adult, unspecified obesity type (Heather Boone)  PLAN:  Insulin Resistance Heather Boone agrees to get back to diet. She will work on weight loss, exercise, and decreasing simple carbohydrates in her diet  to help decrease the risk of diabetes. We dicussed metformin including benefits and risks. She was informed that eating too many simple carbohydrates or too many calories at one sitting increases the likelihood of GI side effects. Odalys agrees to continue metformin and follow up with [place] as directed to monitor her progress.   Diabetes risk counseling Heather Boone was given extended (15 minutes) diabetes prevention counseling today. She is 37 y.o. female and has risk factors for diabetes including obesity and insulin resistance. We discussed intensive lifestyle modifications today with an emphasis on weight loss as well as increasing exercise and decreasing simple carbohydrates in her diet.  Depression with Emotional Eating Behaviors We discussed behavior modification techniques today to help Heather Boone deal with her emotional eating and depression. She has agreed to start  Wellbutrin SR 200 mg qam #30 with no refills and follow up as directed.  Obesity Heather Boone is currently in the action stage of change. As such, her goal is to continue with weight loss efforts She has agreed to keep a food journal with 1400 calories and 75+ grams of protein daily Heather Boone has been  instructed to work up to a goal of 150 minutes of combined cardio and strengthening exercise per week for weight loss and overall health benefits. We discussed the following Behavioral Modification Strategies today: increasing lean protein intake, decreasing simple carbohydrates , decrease eating out and emotional eating strategies  Nallely has agreed to follow up with our clinic in 2 weeks. She was informed of the importance of frequent follow up visits to maximize her success with intensive lifestyle modifications for her multiple health conditions.   OBESITY BEHAVIORAL INTERVENTION VISIT  Today's visit was # 10 out of 22.  Starting weight: 231 lbs Starting date: 07/13/17 Today's weight : 217 lbs Today's date: 01/31/2018 Total lbs lost to date: 14 (Patients must lose 7 lbs in the first 6 months to continue with counseling)   ASK: We discussed the diagnosis of obesity with Heather Boone today and Heather Boone agreed to give [place] permission to discuss obesity behavioral modification therapy today.  ASSESS: Heather Boone has the diagnosis of obesity and her BMI today is 38.45  ADVISE: Heather Boone was educated on the multiple health risks of obesity as well as the benefit of weight loss to improve her health. She was advised of the need for long term treatment and the importance of lifestyle modifications.  AGREE: Multiple dietary modification options and treatment options were discussed and  Heather Boone agreed to the above obesity treatment plan.  I, Doreene Nest, am acting as transcriptionist for Dennard Nip, MD  I have reviewed the above documentation for accuracy and completeness, and I agree with the above. -Dennard Nip, MD

## 2018-02-18 ENCOUNTER — Ambulatory Visit (INDEPENDENT_AMBULATORY_CARE_PROVIDER_SITE_OTHER): Payer: Commercial Managed Care - PPO | Admitting: Family Medicine

## 2018-02-18 VITALS — BP 110/78 | HR 67 | Temp 98.0°F | Ht 63.0 in | Wt 215.0 lb

## 2018-02-18 DIAGNOSIS — Z6838 Body mass index (BMI) 38.0-38.9, adult: Secondary | ICD-10-CM | POA: Diagnosis not present

## 2018-02-18 DIAGNOSIS — E8881 Metabolic syndrome: Secondary | ICD-10-CM

## 2018-02-18 DIAGNOSIS — Z9189 Other specified personal risk factors, not elsewhere classified: Secondary | ICD-10-CM

## 2018-02-18 DIAGNOSIS — F3289 Other specified depressive episodes: Secondary | ICD-10-CM

## 2018-02-18 DIAGNOSIS — E559 Vitamin D deficiency, unspecified: Secondary | ICD-10-CM | POA: Diagnosis not present

## 2018-02-18 DIAGNOSIS — E66812 Obesity, class 2: Secondary | ICD-10-CM

## 2018-02-18 DIAGNOSIS — E88819 Insulin resistance, unspecified: Secondary | ICD-10-CM

## 2018-02-18 MED ORDER — BUPROPION HCL ER (SR) 200 MG PO TB12: 200.0000 mg | ORAL_TABLET | Freq: Every day | ORAL | 0 refills | Status: DC

## 2018-02-18 MED ORDER — METFORMIN HCL 500 MG PO TABS: 500.0000 mg | ORAL_TABLET | Freq: Two times a day (BID) | ORAL | 0 refills | Status: DC

## 2018-02-18 MED ORDER — VITAMIN D (ERGOCALCIFEROL) 1.25 MG (50000 UNIT) PO CAPS: 50000.0000 [IU] | ORAL_CAPSULE | ORAL | 0 refills | Status: DC

## 2018-02-19 NOTE — Progress Notes (Signed)
Office: [phone]  /  Fax: [phone]   HPI:   Chief Complaint: OBESITY Heather Boone is here to discuss her progress with her obesity treatment plan. She is on the keep a food journal with 1400 calories and 75+ grams of protein daily and is following her eating plan approximately 50 % of the time. She states she is walking 30 minutes 2 times per week. Heather Boone did well with weight loss. She was sick with GI issues, and didn't eat mch. She is feeling better and is ready to get back on track. She would like some recipes as well. Her weight is 215 lb (97.5 kg) today and has had a weight loss of 2 pounds over a period of 2 weeks since her last visit. She has lost 16 lbs since starting treatment with [place].  Vitamin D deficiency Heather Boone has a diagnosis of vitamin D deficiency. She is on vit D and fatigue is improving. She denies nausea, vomiting or muscle weakness.   Ref. Range 11/01/2017 08:04  Vitamin D, 25-Hydroxy Latest Ref Range: 30.0 - 100.0 ng/mL 38.9   Insulin Resistance Heather Boone has a diagnosis of insulin resistance based on her elevated fasting insulin level >5. Although Heather Boone's blood glucose readings are still under good control, insulin resistance puts her at greater risk of metabolic syndrome and diabetes. She is taking metformin currently and is doing well with diet. She denies nausea, vomiting or hypoglycemia. Heather Boone continues to work on diet and exercise to decrease risk of diabetes.  At risk for diabetes Heather Boone is at higher than average risk for developing diabetes due to her obesity and insulin resistance. She currently denies polyuria or polydipsia.  Depression with emotional eating behaviors Heather Boone started Wellbutrin and feels her fatigue has improved. She has decreased emotional eating, but this may be due to recent illness. Heather Boone struggles with emotional eating and using food for comfort to the extent that it is negatively impacting her health. She often snacks  when she is not hungry. Heather Boone sometimes feels she is out of control and then feels guilty that she made poor food choices. She has been working on behavior modification techniques to help reduce her emotional eating and has been somewhat successful. She shows no sign of suicidal or homicidal ideations.  Depression screen PHQ 2/9 07/13/2017  Decreased Interest 3  Down, Depressed, Hopeless 1  PHQ - 2 Score 4  Altered sleeping 3  Tired, decreased energy 3  Change in appetite 2  Feeling bad or failure about yourself  0  Trouble concentrating 0  Moving slowly or fidgety/restless 0  Suicidal thoughts 0  PHQ-9 Score 12    ALLERGIES: Allergies  Allergen Reactions  . Aleve [Naproxen Sodium]   . Motrin [Ibuprofen]   . Oxycodone-Acetaminophen   . Tramadol     MEDICATIONS: No current outpatient medications on file prior to visit.   No current facility-administered medications on file prior to visit.     PAST MEDICAL HISTORY: Past Medical History:  Diagnosis Date  . Back pain   . Constipation   . Heartburn   . Obesity     PAST SURGICAL HISTORY: Past Surgical History:  Procedure Laterality Date  . LAPAROSCOPIC GASTRIC BANDING     dec 2011    SOCIAL HISTORY: Social History   Tobacco Use  . Smoking status: Never Smoker  . Smokeless tobacco: Never Used  Substance Use Topics  . Alcohol use: No  . Drug use: No    FAMILY HISTORY: Family History  Problem Relation Age of Onset  . Hyperlipidemia Mother   . Hyperlipidemia Father   . Heart disease Father   . Cancer Father   . Obesity Father     ROS: Review of Systems  Constitutional: Positive for malaise/fatigue and weight loss.  Gastrointestinal: Negative for nausea and vomiting.  Genitourinary: Negative for frequency.  Musculoskeletal:       Negative for muscle weakness  Endo/Heme/Allergies: Negative for polydipsia.       Negative for hypoglcyemia  Psychiatric/Behavioral: Positive for depression. Negative for  suicidal ideas.    PHYSICAL EXAM: Blood pressure 110/78, pulse 67, temperature 98 F (36.7 C), temperature source Oral, height 5\' 3"  (1.6 m), weight 215 lb (97.5 kg), last menstrual period 02/04/2018, SpO2 100 %. Body mass index is 38.09 kg/m. Physical Exam  Constitutional: She is oriented to person, place, and time. She appears well-developed and well-nourished.  Cardiovascular: Normal rate.  Pulmonary/Chest: Effort normal.  Musculoskeletal: Normal range of motion.  Neurological: She is oriented to person, place, and time.  Skin: Skin is warm and dry.  Psychiatric: She has a normal mood and affect. Her behavior is normal.  Vitals reviewed.   RECENT LABS AND TESTS: BMET    Component Value Date/Time   NA 142 11/01/2017 0804   K 4.2 11/01/2017 0804   CL 107 (H) 11/01/2017 0804   CO2 24 11/01/2017 0804   GLUCOSE 90 11/01/2017 0804   GLUCOSE 93 01/13/2010 0220   BUN 10 11/01/2017 0804   CREATININE 0.62 11/01/2017 0804   CALCIUM 9.2 11/01/2017 0804   GFRNONAA 119 11/01/2017 0804   GFRAA 137 11/01/2017 0804   Lab Results  Component Value Date   HGBA1C 5.1 11/01/2017   HGBA1C 5.5 07/13/2017   Lab Results  Component Value Date   INSULIN 14.2 11/01/2017   INSULIN 11.0 07/13/2017   CBC    Component Value Date/Time   WBC 7.3 07/13/2017 1030   WBC 12.2 (H) 01/13/2010 0205   RBC 4.52 07/13/2017 1030   RBC 5.05 01/13/2010 0205   HGB 12.0 07/13/2017 1030   HCT 38.2 07/13/2017 1030   PLT 383 01/13/2010 0205   MCV 85 07/13/2017 1030   MCH 26.5 (L) 07/13/2017 1030   MCHC 31.4 (L) 07/13/2017 1030   MCHC 33.9 01/13/2010 0205   RDW 14.3 07/13/2017 1030   LYMPHSABS 1.7 07/13/2017 1030   MONOABS 0.7 01/13/2010 0205   EOSABS 0.1 07/13/2017 1030   BASOSABS 0.0 07/13/2017 1030   Iron/TIBC/Ferritin/ %Sat No results found for: IRON, TIBC, FERRITIN, IRONPCTSAT Lipid Panel     Component Value Date/Time   CHOL 135 07/13/2017 1030   TRIG 54 07/13/2017 1030   HDL 61 07/13/2017  1030   LDLCALC 63 07/13/2017 1030   Hepatic Function Panel     Component Value Date/Time   PROT 6.4 11/01/2017 0804   ALBUMIN 3.6 11/01/2017 0804   AST 15 11/01/2017 0804   ALT 12 11/01/2017 0804   ALKPHOS 76 11/01/2017 0804   BILITOT 0.3 11/01/2017 0804   BILIDIR 0.2 01/13/2010 0205   IBILI 0.8 01/13/2010 0205      Component Value Date/Time   TSH 1.650 07/13/2017 1030     Ref. Range 11/01/2017 08:04  Vitamin D, 25-Hydroxy Latest Ref Range: 30.0 - 100.0 ng/mL 38.9   ASSESSMENT AND PLAN: Insulin resistance - Plan: metFORMIN (GLUCOPHAGE) 500 MG tablet  Vitamin D deficiency - Plan: Vitamin D, Ergocalciferol, (DRISDOL) 50000 units CAPS capsule  Other depression - with emotional eating - Plan:  buPROPion (WELLBUTRIN SR) 200 MG 12 hr tablet  At risk for diabetes mellitus  Class 2 severe obesity with serious comorbidity and body mass index (BMI) of 38.0 to 38.9 in adult, unspecified obesity type (Heather Boone)  PLAN:  Vitamin D Deficiency Heather Boone was informed that low vitamin D levels contributes to fatigue and are associated with obesity, breast, and colon cancer. She agrees to continue to take prescription Vit D @50 ,000 IU every week #4 with no refills and will follow up for routine testing of vitamin D, at least 2-3 times per year. She was informed of the risk of over-replacement of vitamin D and agrees to not increase her dose unless she discusses this with [place] first. Heather Boone agrees to follow up with our clinic in 2 weeks.  Insulin Resistance Heather Boone will continue to work on weight loss, exercise, and decreasing simple carbohydrates in her diet to help decrease the risk of diabetes. We dicussed metformin including benefits and risks. She was informed that eating too many simple carbohydrates or too many calories at one sitting increases the likelihood of GI side effects. Heather Boone requested metformin for now and prescription was written today for 1 month refill. Heather Boone agreed to follow  up with [place] as directed to monitor her progress.  Diabetes risk counseling Heather Boone was given extended (15 minutes) diabetes prevention counseling today. She is 37 y.o. female and has risk factors for diabetes including obesity and insulin resistance. We discussed intensive lifestyle modifications today with an emphasis on weight loss as well as increasing exercise and decreasing simple carbohydrates in her diet.  Depression with Emotional Eating Behaviors We discussed behavior modification techniques today to help Heather Boone deal with her emotional eating and depression. She has agreed to continue Wellbutrin SR 200 mg qd #30 with no refills and follow up as directed.  Obesity Heather Boone is currently in the action stage of change. As such, her goal is to continue with weight loss efforts She has agreed to keep a food journal with 1200 to 1400 calories and 70+ grams of protein daily Heather Boone has been instructed to work up to a goal of 150 minutes of combined cardio and strengthening exercise per week for weight loss and overall health benefits. We discussed the following Behavioral Modification Strategies today: increasing lean protein intake and decreasing simple carbohydrates  Easy lean protein, vegetable rich recipes were given today.  Heather Boone has agreed to follow up with our clinic in 2 weeks. She was informed of the importance of frequent follow up visits to maximize her success with intensive lifestyle modifications for her multiple health conditions.   OBESITY BEHAVIORAL INTERVENTION VISIT  Today's visit was # 11 out of 22.  Starting weight: 231 lbs Starting date: 07/13/17 Today's weight : 215 lbs Today's date: 02/18/2018 Total lbs lost to date: 16 (Patients must lose 7 lbs in the first 6 months to continue with counseling)   ASK: We discussed the diagnosis of obesity with Heather Boone today and Heather Boone agreed to give [place] permission to discuss obesity behavioral modification  therapy today.  ASSESS: Heather Boone has the diagnosis of obesity and her BMI today is 38.09 Heather Boone is in the action stage of change   ADVISE: Heather Boone was educated on the multiple health risks of obesity as well as the benefit of weight loss to improve her health. She was advised of the need for long term treatment and the importance of lifestyle modifications.  AGREE: Multiple dietary modification options and treatment options were discussed and  Heather Boone  agreed to the above obesity treatment plan.  I, Doreene Nest, am acting as transcriptionist for Dennard Nip, MD  I have reviewed the above documentation for accuracy and completeness, and I agree with the above. -Dennard Nip, MD

## 2018-03-04 ENCOUNTER — Ambulatory Visit (INDEPENDENT_AMBULATORY_CARE_PROVIDER_SITE_OTHER): Payer: Commercial Managed Care - PPO | Admitting: Family Medicine

## 2018-03-04 VITALS — BP 104/68 | HR 56 | Ht 63.0 in | Wt 214.0 lb

## 2018-03-04 DIAGNOSIS — E8881 Metabolic syndrome: Secondary | ICD-10-CM

## 2018-03-04 DIAGNOSIS — E559 Vitamin D deficiency, unspecified: Secondary | ICD-10-CM

## 2018-03-04 DIAGNOSIS — Z9189 Other specified personal risk factors, not elsewhere classified: Secondary | ICD-10-CM

## 2018-03-04 DIAGNOSIS — E88819 Insulin resistance, unspecified: Secondary | ICD-10-CM

## 2018-03-04 DIAGNOSIS — Z6838 Body mass index (BMI) 38.0-38.9, adult: Secondary | ICD-10-CM | POA: Diagnosis not present

## 2018-03-04 NOTE — Progress Notes (Signed)
Office: [phone]  /  Fax: [phone]   HPI:   Chief Complaint: OBESITY Heather Boone is here to discuss her progress with her obesity treatment plan. She is on the keep a food journal with 1200-1400 calories and 70+ grams of protein daily and is following her eating plan approximately 85 % of the time. She states she is walking for 30 minutes 2 times per week. Heather Boone continues to do well with weight loss and notes decrease evening cravings. She is working on increasing protein especially in the morning.  Her weight is 214 lb (97.1 kg) today and has had a weight loss of 1 pound over a period of 2 weeks since her last visit. She has lost 17 lbs since starting treatment with [place].  Vitamin D Deficiency Heather Boone has a diagnosis of vitamin D deficiency. She is on Vit D prescription, last level at goal. She notes fatigue is improving and denies nausea, vomiting or muscle weakness.  Insulin Resistance Heather Boone has a diagnosis of insulin resistance based on her elevated fasting insulin level >5. Although Heather Boone's blood glucose readings are still under good control, insulin resistance puts her at greater risk of metabolic syndrome and diabetes. She is stable on metformin and she notes decreased polyphagia and continues to work on diet and exercise to decrease risk of diabetes. She denies nausea, vomiting, or hypoglycemia.  At risk for diabetes Heather Boone is at higher than average risk for developing diabetes due to her obesity and insulin resistance. She currently denies polyuria or polydipsia.  ALLERGIES: Allergies  Allergen Reactions  . Aleve [Naproxen Sodium]   . Motrin [Ibuprofen]   . Oxycodone-Acetaminophen   . Tramadol     MEDICATIONS: Current Outpatient Medications on File Prior to Visit  Medication Sig Dispense Refill  . buPROPion (WELLBUTRIN SR) 200 MG 12 hr tablet Take 1 tablet (200 mg total) by mouth daily at 12 noon. 30 tablet 0  . metFORMIN (GLUCOPHAGE) 500 MG tablet Take 1  tablet (500 mg total) by mouth 2 (two) times daily with a meal. 60 tablet 0  . Vitamin D, Ergocalciferol, (DRISDOL) 50000 units CAPS capsule Take 1 capsule (50,000 Units total) by mouth every 7 (seven) days. 4 capsule 0   No current facility-administered medications on file prior to visit.     PAST MEDICAL HISTORY: Past Medical History:  Diagnosis Date  . Back pain   . Constipation   . Heartburn   . Obesity     PAST SURGICAL HISTORY: Past Surgical History:  Procedure Laterality Date  . LAPAROSCOPIC GASTRIC BANDING     dec 2011    SOCIAL HISTORY: Social History   Tobacco Use  . Smoking status: Never Smoker  . Smokeless tobacco: Never Used  Substance Use Topics  . Alcohol use: No  . Drug use: No    FAMILY HISTORY: Family History  Problem Relation Age of Onset  . Hyperlipidemia Mother   . Hyperlipidemia Father   . Heart disease Father   . Cancer Father   . Obesity Father     ROS: Review of Systems  Constitutional: Positive for malaise/fatigue and weight loss.  Gastrointestinal: Negative for nausea and vomiting.  Genitourinary: Negative for frequency.  Musculoskeletal:       Negative muscle weakness  Endo/Heme/Allergies: Negative for polydipsia.       Negative hypoglycemia Positive polyphagia    PHYSICAL EXAM: Blood pressure 104/68, pulse (!) 56, height 5\' 3"  (1.6 m), weight 214 lb (97.1 kg), last menstrual period 03/01/2018, SpO2 100 %.  Body mass index is 37.91 kg/m. Physical Exam  Constitutional: She is oriented to person, place, and time. She appears well-developed and well-nourished.  Cardiovascular: Normal rate.  Pulmonary/Chest: Effort normal.  Musculoskeletal: Normal range of motion.  Neurological: She is oriented to person, place, and time.  Skin: Skin is warm and dry.  Psychiatric: She has a normal mood and affect. Her behavior is normal.  Vitals reviewed.   RECENT LABS AND TESTS: BMET    Component Value Date/Time   NA 142 11/01/2017 0804    K 4.2 11/01/2017 0804   CL 107 (H) 11/01/2017 0804   CO2 24 11/01/2017 0804   GLUCOSE 90 11/01/2017 0804   GLUCOSE 93 01/13/2010 0220   BUN 10 11/01/2017 0804   CREATININE 0.62 11/01/2017 0804   CALCIUM 9.2 11/01/2017 0804   GFRNONAA 119 11/01/2017 0804   GFRAA 137 11/01/2017 0804   Lab Results  Component Value Date   HGBA1C 5.1 11/01/2017   HGBA1C 5.5 07/13/2017   Lab Results  Component Value Date   INSULIN 14.2 11/01/2017   INSULIN 11.0 07/13/2017   CBC    Component Value Date/Time   WBC 7.3 07/13/2017 1030   WBC 12.2 (H) 01/13/2010 0205   RBC 4.52 07/13/2017 1030   RBC 5.05 01/13/2010 0205   HGB 12.0 07/13/2017 1030   HCT 38.2 07/13/2017 1030   PLT 383 01/13/2010 0205   MCV 85 07/13/2017 1030   MCH 26.5 (L) 07/13/2017 1030   MCHC 31.4 (L) 07/13/2017 1030   MCHC 33.9 01/13/2010 0205   RDW 14.3 07/13/2017 1030   LYMPHSABS 1.7 07/13/2017 1030   MONOABS 0.7 01/13/2010 0205   EOSABS 0.1 07/13/2017 1030   BASOSABS 0.0 07/13/2017 1030   Iron/TIBC/Ferritin/ %Sat No results found for: IRON, TIBC, FERRITIN, IRONPCTSAT Lipid Panel     Component Value Date/Time   CHOL 135 07/13/2017 1030   TRIG 54 07/13/2017 1030   HDL 61 07/13/2017 1030   LDLCALC 63 07/13/2017 1030   Hepatic Function Panel     Component Value Date/Time   PROT 6.4 11/01/2017 0804   ALBUMIN 3.6 11/01/2017 0804   AST 15 11/01/2017 0804   ALT 12 11/01/2017 0804   ALKPHOS 76 11/01/2017 0804   BILITOT 0.3 11/01/2017 0804   BILIDIR 0.2 01/13/2010 0205   IBILI 0.8 01/13/2010 0205      Component Value Date/Time   TSH 1.650 07/13/2017 1030  Results for SHAKARA, TWEEDY (MRN [id number]) as of 03/04/2018 08:14  Ref. Range 11/01/2017 08:04  Vitamin D, 25-Hydroxy Latest Ref Range: 30.0 - 100.0 ng/mL 38.9    ASSESSMENT AND PLAN: Vitamin D deficiency - Plan: VITAMIN D 25 Hydroxy (Vit-D Deficiency, Fractures)  Insulin resistance - Plan: Comprehensive metabolic panel, Insulin, random, Lipid Panel With  LDL/HDL Ratio  At risk for diabetes mellitus  Class 2 severe obesity with serious comorbidity and body mass index (BMI) of 38.0 to 38.9 in adult, unspecified obesity type ([place])  PLAN:  Vitamin D Deficiency Heather Boone was informed that low vitamin D levels contributes to fatigue and are associated with obesity, breast, and colon cancer. Heather Boone agrees to continue taking prescription Vit D @50 ,000 IU every week #4 and will follow up for routine testing of vitamin D, at least 2-3 times per year. She was informed of the risk of over-replacement of vitamin D and agrees to not increase her dose unless she discusses this with [place] first. We will check labs and Heather Boone agrees to follow up with our clinic in  2 weeks.  Insulin Resistance Heather Boone will continue to work on weight loss, diet, exercise, and decreasing simple carbohydrates in her diet to help decrease the risk of diabetes. We dicussed metformin including benefits and risks. She was informed that eating too many simple carbohydrates or too many calories at one sitting increases the likelihood of GI side effects. Heather Boone agrees to continue taking metformin as is. We will check labs and Heather Boone agrees to follow up with our clinic in 2 weeks as directed to monitor her progress.  Diabetes risk counselling Heather Boone was given extended (15 minutes) diabetes prevention counseling today. She is 37 y.o. female and has risk factors for diabetes including obesity and insulin resistance. We discussed intensive lifestyle modifications today with an emphasis on weight loss as well as increasing exercise and decreasing simple carbohydrates in her diet.  Obesity Heather Boone is currently in the action stage of change. As such, her goal is to continue with weight loss efforts She has agreed to keep a food journal with 1200 calories and 75+ grams of protein daily Anouk has been instructed to work up to a goal of 150 minutes of combined cardio and strengthening  exercise per week for weight loss and overall health benefits. We discussed the following Behavioral Modification Strategies today: increasing lean protein intake, decreasing simple carbohydrates  and ways to avoid night time snacking   Darcee has agreed to follow up with our clinic in 2 weeks. She was informed of the importance of frequent follow up visits to maximize her success with intensive lifestyle modifications for her multiple health conditions.   OBESITY BEHAVIORAL INTERVENTION VISIT  Today's visit was # 12 out of 22.  Starting weight: 231 lbs Starting date: 07/13/17 Today's weight : 214 lbs  Today's date: 03/04/2018 Total lbs lost to date: 17 (Patients must lose 7 lbs in the first 6 months to continue with counseling)   ASK: We discussed the diagnosis of obesity with Tacy Learn today and Evelin agreed to give [place] permission to discuss obesity behavioral modification therapy today.  ASSESS: Janilah has the diagnosis of obesity and her BMI today is 37.92 Keosha is in the action stage of change   ADVISE: Jamayah was educated on the multiple health risks of obesity as well as the benefit of weight loss to improve her health. She was advised of the need for long term treatment and the importance of lifestyle modifications.  AGREE: Multiple dietary modification options and treatment options were discussed and  Arna agreed to the above obesity treatment plan.  I, Trixie Dredge, am acting as transcriptionist for Dennard Nip, MD  I have reviewed the above documentation for accuracy and completeness, and I agree with the above. -Dennard Nip, MD

## 2018-03-05 LAB — COMPREHENSIVE METABOLIC PANEL
ALT: 7 IU/L (ref 0–32)
AST: 14 IU/L (ref 0–40)
Albumin/Globulin Ratio: 1.3 (ref 1.2–2.2)
Albumin: 3.8 g/dL (ref 3.5–5.5)
Alkaline Phosphatase: 83 IU/L (ref 39–117)
BUN/Creatinine Ratio: 14 (ref 9–23)
BUN: 13 mg/dL (ref 6–20)
Bilirubin Total: 0.3 mg/dL (ref 0.0–1.2)
CO2: 20 mmol/L (ref 20–29)
Calcium: 9 mg/dL (ref 8.7–10.2)
Chloride: 107 mmol/L — ABNORMAL HIGH (ref 96–106)
Creatinine, Ser: 0.91 mg/dL (ref 0.57–1.00)
GFR calc Af Amer: 95 mL/min/{1.73_m2} (ref >59)
GFR calc non Af Amer: 83 mL/min/{1.73_m2} (ref >59)
Globulin, Total: 2.9 g/dL (ref 1.5–4.5)
Glucose: 85 mg/dL (ref 65–99)
Potassium: 4.2 mmol/L (ref 3.5–5.2)
Sodium: 142 mmol/L (ref 134–144)
Total Protein: 6.7 g/dL (ref 6.0–8.5)

## 2018-03-05 LAB — INSULIN, RANDOM: INSULIN: 8.5 u[IU]/mL (ref 2.6–24.9)

## 2018-03-05 LAB — CBC WITH DIFFERENTIAL
Basophils Absolute: 0 10*3/uL (ref 0.0–0.2)
Basos: 1 %
EOS (ABSOLUTE): 0.1 10*3/uL (ref 0.0–0.4)
Eos: 1 %
Hematocrit: 37.1 % (ref 34.0–46.6)
Hemoglobin: 11.7 g/dL (ref 11.1–15.9)
Immature Grans (Abs): 0 10*3/uL (ref 0.0–0.1)
Immature Granulocytes: 0 %
Lymphocytes Absolute: 2.4 10*3/uL (ref 0.7–3.1)
Lymphs: 35 %
MCH: 26.4 pg — ABNORMAL LOW (ref 26.6–33.0)
MCHC: 31.5 g/dL (ref 31.5–35.7)
MCV: 84 fL (ref 79–97)
Monocytes Absolute: 0.5 10*3/uL (ref 0.1–0.9)
Monocytes: 8 %
Neutrophils Absolute: 3.8 10*3/uL (ref 1.4–7.0)
Neutrophils: 55 %
RBC: 4.44 x10E6/uL (ref 3.77–5.28)
RDW: 14.6 % (ref 12.3–15.4)
WBC: 6.8 10*3/uL (ref 3.4–10.8)

## 2018-03-05 LAB — FOLATE: Folate: 9 ng/mL (ref >3.0)

## 2018-03-05 LAB — VITAMIN D 25 HYDROXY (VIT D DEFICIENCY, FRACTURES): Vit D, 25-Hydroxy: 37.4 ng/mL (ref 30.0–100.0)

## 2018-03-05 LAB — LIPID PANEL WITH LDL/HDL RATIO
Cholesterol, Total: 159 mg/dL (ref 100–199)
HDL: 72 mg/dL (ref >39)
LDL Calculated: 78 mg/dL (ref 0–99)
LDl/HDL Ratio: 1.1 ratio (ref 0.0–3.2)
Triglycerides: 44 mg/dL (ref 0–149)
VLDL Cholesterol Cal: 9 mg/dL (ref 5–40)

## 2018-03-05 LAB — T3: T3, Total: 96 ng/dL (ref 71–180)

## 2018-03-05 LAB — HEMOGLOBIN A1C
Est. average glucose Bld gHb Est-mCnc: 103 mg/dL
Hgb A1c MFr Bld: 5.2 % (ref 4.8–5.6)

## 2018-03-05 LAB — VITAMIN B12: Vitamin B-12: 423 pg/mL (ref 232–1245)

## 2018-03-05 LAB — T4, FREE: Free T4: 0.86 ng/dL (ref 0.82–1.77)

## 2018-03-05 LAB — TSH: TSH: 4.09 u[IU]/mL (ref 0.450–4.500)

## 2018-03-23 ENCOUNTER — Other Ambulatory Visit (INDEPENDENT_AMBULATORY_CARE_PROVIDER_SITE_OTHER): Payer: Self-pay | Admitting: Family Medicine

## 2018-03-23 DIAGNOSIS — E559 Vitamin D deficiency, unspecified: Secondary | ICD-10-CM

## 2018-03-25 ENCOUNTER — Ambulatory Visit (INDEPENDENT_AMBULATORY_CARE_PROVIDER_SITE_OTHER): Payer: Commercial Managed Care - PPO | Admitting: Physician Assistant

## 2018-03-25 VITALS — BP 125/84 | HR 65 | Temp 97.9°F | Ht 63.0 in | Wt 215.0 lb

## 2018-03-25 DIAGNOSIS — E8881 Metabolic syndrome: Secondary | ICD-10-CM | POA: Diagnosis not present

## 2018-03-25 DIAGNOSIS — E559 Vitamin D deficiency, unspecified: Secondary | ICD-10-CM

## 2018-03-25 DIAGNOSIS — F3289 Other specified depressive episodes: Secondary | ICD-10-CM | POA: Diagnosis not present

## 2018-03-25 DIAGNOSIS — Z9189 Other specified personal risk factors, not elsewhere classified: Secondary | ICD-10-CM | POA: Diagnosis not present

## 2018-03-25 DIAGNOSIS — Z6838 Body mass index (BMI) 38.0-38.9, adult: Secondary | ICD-10-CM

## 2018-03-25 MED ORDER — METFORMIN HCL 500 MG PO TABS
500.0000 mg | ORAL_TABLET | Freq: Two times a day (BID) | ORAL | 0 refills | Status: DC
Start: 2018-03-25 — End: 2018-05-13

## 2018-03-25 MED ORDER — VITAMIN D (ERGOCALCIFEROL) 1.25 MG (50000 UNIT) PO CAPS
50000.0000 [IU] | ORAL_CAPSULE | ORAL | 0 refills | Status: DC
Start: 2018-03-25 — End: 2018-05-13

## 2018-03-25 MED ORDER — BUPROPION HCL ER (SR) 200 MG PO TB12: 200.0000 mg | ORAL_TABLET | Freq: Every day | ORAL | 0 refills | Status: DC

## 2018-03-26 NOTE — Progress Notes (Signed)
Office: [phone]  /  Fax: [phone]   HPI:   Chief Complaint: OBESITY Heather Boone is here to discuss her progress with her obesity treatment plan. She is on the keep a food journal with 1200 calories and 75+ grams of protein  and is following her eating plan approximately 80 % of the time. She states she is walking for 30 minutes 3 times per week. Heather Boone is just now getting better from recent sickness with the flu. She has not been getting the required protein. She is now motivated to get back on track and continue weight loss. Her weight is 215 lb (97.5 kg) today and has had a weight gain of 1 pound over a period of 3 weeks since her last visit. She has lost 16 lbs since starting treatment with [place].  Vitamin D deficiency Heather Boone has a diagnosis of vitamin D deficiency. She is currently taking vit D and denies nausea, vomiting or muscle weakness.  Insulin Resistance Heather Boone has a diagnosis of insulin resistance based on her elevated fasting insulin level >5. Although Heather Boone's blood glucose readings are still under good control, insulin resistance puts her at greater risk of metabolic syndrome and diabetes. She is taking metformin currently and continues to work on diet and exercise to decrease risk of diabetes. She denies polyphagia  At risk for diabetes Heather Boone is at higher than average risk for developing diabetes due to her obesity and insulin resistance. She currently denies polyuria or polydipsia.  Depression with emotional eating behaviors Heather Boone is struggling with emotional eating and using food for comfort to the extent that it is negatively impacting her health. She often snacks when she is not hungry. Heather Boone sometimes feels she is out of control and then feels guilty that she made poor food choices. She has been working on behavior modification techniques to help reduce her emotional eating and has been somewhat successful. Her mood is stable and she shows no sign of  suicidal or homicidal ideations.  Depression screen PHQ 2/9 07/13/2017  Decreased Interest 3  Down, Depressed, Hopeless 1  PHQ - 2 Score 4  Altered sleeping 3  Tired, decreased energy 3  Change in appetite 2  Feeling bad or failure about yourself  0  Trouble concentrating 0  Moving slowly or fidgety/restless 0  Suicidal thoughts 0  PHQ-9 Score 12     ALLERGIES: Allergies  Allergen Reactions  . Aleve [Naproxen Sodium]   . Motrin [Ibuprofen]   . Oxycodone-Acetaminophen   . Tramadol     MEDICATIONS: No current outpatient medications on file prior to visit.   No current facility-administered medications on file prior to visit.     PAST MEDICAL HISTORY: Past Medical History:  Diagnosis Date  . Back pain   . Constipation   . Heartburn   . Obesity     PAST SURGICAL HISTORY: Past Surgical History:  Procedure Laterality Date  . LAPAROSCOPIC GASTRIC BANDING     dec 2011    SOCIAL HISTORY: Social History   Tobacco Use  . Smoking status: Never Smoker  . Smokeless tobacco: Never Used  Substance Use Topics  . Alcohol use: No  . Drug use: No    FAMILY HISTORY: Family History  Problem Relation Age of Onset  . Hyperlipidemia Mother   . Hyperlipidemia Father   . Heart disease Father   . Cancer Father   . Obesity Father     ROS: Review of Systems  Constitutional: Negative for weight loss.  Gastrointestinal: Negative  for nausea and vomiting.  Genitourinary: Negative for frequency.  Musculoskeletal:       Negative muscle weakness  Endo/Heme/Allergies: Negative for polydipsia.       Negative for polyphagia  Psychiatric/Behavioral: Positive for depression. Negative for suicidal ideas.    PHYSICAL EXAM: Blood pressure 125/84, pulse 65, temperature 97.9 F (36.6 C), temperature source Oral, height 5\' 3"  (1.6 m), weight 215 lb (97.5 kg), last menstrual period 03/01/2018, SpO2 98 %. Body mass index is 38.09 kg/m. Physical Exam  Constitutional: She is  oriented to person, place, and time. She appears well-developed and well-nourished.  Cardiovascular: Normal rate.  Pulmonary/Chest: Effort normal.  Musculoskeletal: Normal range of motion.  Neurological: She is oriented to person, place, and time.  Skin: Skin is warm and dry.  Psychiatric: She has a normal mood and affect. Her behavior is normal.  Vitals reviewed.   RECENT LABS AND TESTS: BMET    Component Value Date/Time   NA 142 03/04/2018 0740   K 4.2 03/04/2018 0740   CL 107 (H) 03/04/2018 0740   CO2 20 03/04/2018 0740   GLUCOSE 85 03/04/2018 0740   GLUCOSE 93 01/13/2010 0220   BUN 13 03/04/2018 0740   CREATININE 0.91 03/04/2018 0740   CALCIUM 9.0 03/04/2018 0740   GFRNONAA 83 03/04/2018 0740   GFRAA 95 03/04/2018 0740   Lab Results  Component Value Date   HGBA1C 5.2 03/04/2018   HGBA1C 5.1 11/01/2017   HGBA1C 5.5 07/13/2017   Lab Results  Component Value Date   INSULIN 8.5 03/04/2018   INSULIN 14.2 11/01/2017   INSULIN 11.0 07/13/2017   CBC    Component Value Date/Time   WBC 6.8 03/04/2018 0740   WBC 12.2 (H) 01/13/2010 0205   RBC 4.44 03/04/2018 0740   RBC 5.05 01/13/2010 0205   HGB 11.7 03/04/2018 0740   HCT 37.1 03/04/2018 0740   PLT 383 01/13/2010 0205   MCV 84 03/04/2018 0740   MCH 26.4 (L) 03/04/2018 0740   MCHC 31.5 03/04/2018 0740   MCHC 33.9 01/13/2010 0205   RDW 14.6 03/04/2018 0740   LYMPHSABS 2.4 03/04/2018 0740   MONOABS 0.7 01/13/2010 0205   EOSABS 0.1 03/04/2018 0740   BASOSABS 0.0 03/04/2018 0740   Iron/TIBC/Ferritin/ %Sat No results found for: IRON, TIBC, FERRITIN, IRONPCTSAT Lipid Panel     Component Value Date/Time   CHOL 159 03/04/2018 0740   TRIG 44 03/04/2018 0740   HDL 72 03/04/2018 0740   LDLCALC 78 03/04/2018 0740   Hepatic Function Panel     Component Value Date/Time   PROT 6.7 03/04/2018 0740   ALBUMIN 3.8 03/04/2018 0740   AST 14 03/04/2018 0740   ALT 7 03/04/2018 0740   ALKPHOS 83 03/04/2018 0740   BILITOT  0.3 03/04/2018 0740   BILIDIR 0.2 01/13/2010 0205   IBILI 0.8 01/13/2010 0205      Component Value Date/Time   TSH 4.090 03/04/2018 0740   TSH 1.650 07/13/2017 1030   Results for SIERRAH, LUEVANO (MRN [id number]) as of 03/26/2018 07:55  Ref. Range 03/04/2018 07:40  Vitamin D, 25-Hydroxy Latest Ref Range: 30.0 - 100.0 ng/mL 37.4   ASSESSMENT AND PLAN: Vitamin D deficiency - Plan: Vitamin D, Ergocalciferol, (DRISDOL) 50000 units CAPS capsule  Insulin resistance - Plan: metFORMIN (GLUCOPHAGE) 500 MG tablet  Other depression - with emotional eating - Plan: buPROPion (WELLBUTRIN SR) 200 MG 12 hr tablet  At risk for diabetes mellitus  Class 2 severe obesity with serious comorbidity and body mass  index (BMI) of 38.0 to 38.9 in adult, unspecified obesity type ([place])  PLAN:  Vitamin D Deficiency Heather Boone was informed that low vitamin D levels contributes to fatigue and are associated with obesity, breast, and colon cancer. She agrees to continue to take prescription Vit D @50 ,000 IU every week #4 with no refills and will follow up for routine testing of vitamin D, at least 2-3 times per year. She was informed of the risk of over-replacement of vitamin D and agrees to not increase her dose unless she discusses this with [place] first. Su agreed to follow up with our clinic in 2 weeks.  Insulin Resistance Heather Boone will continue to work on weight loss, exercise, and decreasing simple carbohydrates in her diet to help decrease the risk of diabetes. We dicussed metformin including benefits and risks. She was informed that eating too many simple carbohydrates or too many calories at one sitting increases the likelihood of GI side effects. Heather Boone requested metformin for now and prescription was written today for 1 month refill. Cori agreed to follow up with [place] as directed to monitor her progress.  Diabetes risk counseling Heather Boone was given extended (15 minutes) diabetes prevention counseling  today. She is 37 y.o. female and has risk factors for diabetes including obesity and insulin resistance. We discussed intensive lifestyle modifications today with an emphasis on weight loss as well as increasing exercise and decreasing simple carbohydrates in her diet.  Depression with Emotional Eating Behaviors We discussed behavior modification techniques today to help Heather Boone deal with her emotional eating and depression. She has agreed to continue Wellbutrin SR 200 mg qd #30 with no refills and follow up as directed.  Obesity Heather Boone is currently in the action stage of change. As such, her goal is to continue with weight loss efforts She has agreed to keep a food journal with 1200 calories and 75 grams of protein  Heather Boone has been instructed to work up to a goal of 150 minutes of combined cardio and strengthening exercise per week for weight loss and overall health benefits. We discussed the following Behavioral Modification Strategies today: increasing lean protein intake and travel eating strategies   Heather Boone has agreed to follow up with our clinic in 2 weeks. She was informed of the importance of frequent follow up visits to maximize her success with intensive lifestyle modifications for her multiple health conditions.    OBESITY BEHAVIORAL INTERVENTION VISIT  Today's visit was # 13 out of 22.  Starting weight: 231 lbs Starting date: 07/13/17 Today's weight : 215 lbs  Today's date: 03/25/2018 Total lbs lost to date: 16 (Patients must lose 7 lbs in the first 6 months to continue with counseling)   ASK: We discussed the diagnosis of obesity with Heather Boone today and Heather Boone agreed to give [place] permission to discuss obesity behavioral modification therapy today.  ASSESS: Heather Boone has the diagnosis of obesity and her BMI today is 38.09 Heather Boone is in the action stage of change   ADVISE: Heather Boone was educated on the multiple health risks of obesity as well as the benefit  of weight loss to improve her health. She was advised of the need for long term treatment and the importance of lifestyle modifications.  AGREE: Multiple dietary modification options and treatment options were discussed and  Tashai agreed to the above obesity treatment plan.   Corey Skains, am acting as transcriptionist for [employer], PA-C I, Lacy Duverney [facility], have reviewed this note and agree with its content

## 2018-04-09 ENCOUNTER — Ambulatory Visit (INDEPENDENT_AMBULATORY_CARE_PROVIDER_SITE_OTHER): Payer: Commercial Managed Care - PPO | Admitting: Physician Assistant

## 2018-04-09 VITALS — BP 119/75 | HR 65 | Temp 98.0°F | Ht 63.0 in | Wt 218.0 lb

## 2018-04-09 DIAGNOSIS — E559 Vitamin D deficiency, unspecified: Secondary | ICD-10-CM

## 2018-04-09 DIAGNOSIS — Z6838 Body mass index (BMI) 38.0-38.9, adult: Secondary | ICD-10-CM

## 2018-04-10 NOTE — Progress Notes (Signed)
Office: [phone]  /  Fax: [phone]   HPI:   Chief Complaint: OBESITY Heather Boone is here to discuss her progress with her obesity treatment plan. She is on the keep a food journal with 1200 calories and 75 grams of protein daily and is following her eating plan approximately 85 % of the time. She states she is walking for 30 minutes 3 to 4 times per week. Heather Boone is retaining fluids. She states she has not kept up with her water intake. Her weight is 218 lb (98.9 kg) today and has had a weight gain of 3 pounds over a period of 2 weeks since her last visit. She has lost 13 lbs since starting treatment with [place].  Vitamin D deficiency Heather Boone has a diagnosis of vitamin D deficiency. She is currently taking vit D and denies nausea, vomiting or muscle weakness.  ALLERGIES: Allergies  Allergen Reactions  . Aleve [Naproxen Sodium]   . Motrin [Ibuprofen]   . Oxycodone-Acetaminophen   . Tramadol     MEDICATIONS: Current Outpatient Medications on File Prior to Visit  Medication Sig Dispense Refill  . buPROPion (WELLBUTRIN SR) 200 MG 12 hr tablet Take 1 tablet (200 mg total) by mouth daily at 12 noon. 30 tablet 0  . metFORMIN (GLUCOPHAGE) 500 MG tablet Take 1 tablet (500 mg total) by mouth 2 (two) times daily with a meal. 60 tablet 0  . Vitamin D, Ergocalciferol, (DRISDOL) 50000 units CAPS capsule Take 1 capsule (50,000 Units total) by mouth every 7 (seven) days. 4 capsule 0   No current facility-administered medications on file prior to visit.     PAST MEDICAL HISTORY: Past Medical History:  Diagnosis Date  . Back pain   . Constipation   . Heartburn   . Obesity     PAST SURGICAL HISTORY: Past Surgical History:  Procedure Laterality Date  . LAPAROSCOPIC GASTRIC BANDING     dec 2011    SOCIAL HISTORY: Social History   Tobacco Use  . Smoking status: Never Smoker  . Smokeless tobacco: Never Used  Substance Use Topics  . Alcohol use: No  . Drug use: No    FAMILY  HISTORY: Family History  Problem Relation Age of Onset  . Hyperlipidemia Mother   . Hyperlipidemia Father   . Heart disease Father   . Cancer Father   . Obesity Father     ROS: Review of Systems  Constitutional: Negative for weight loss.  Gastrointestinal: Negative for nausea and vomiting.  Musculoskeletal:       Negative for muscle weakness    PHYSICAL EXAM: Blood pressure 119/75, pulse 65, temperature 98 F (36.7 C), temperature source Oral, height 5\' 3"  (1.6 m), weight 218 lb (98.9 kg), SpO2 100 %. Body mass index is 38.62 kg/m. Physical Exam  Constitutional: She is oriented to person, place, and time. She appears well-developed and well-nourished.  Cardiovascular: Normal rate.  Pulmonary/Chest: Effort normal.  Musculoskeletal: Normal range of motion.  Neurological: She is oriented to person, place, and time.  Skin: Skin is warm and dry.  Psychiatric: She has a normal mood and affect. Her behavior is normal.  Vitals reviewed.   RECENT LABS AND TESTS: BMET    Component Value Date/Time   NA 142 03/04/2018 0740   K 4.2 03/04/2018 0740   CL 107 (H) 03/04/2018 0740   CO2 20 03/04/2018 0740   GLUCOSE 85 03/04/2018 0740   GLUCOSE 93 01/13/2010 0220   BUN 13 03/04/2018 0740   CREATININE 0.91  03/04/2018 0740   CALCIUM 9.0 03/04/2018 0740   GFRNONAA 83 03/04/2018 0740   GFRAA 95 03/04/2018 0740   Lab Results  Component Value Date   HGBA1C 5.2 03/04/2018   HGBA1C 5.1 11/01/2017   HGBA1C 5.5 07/13/2017   Lab Results  Component Value Date   INSULIN 8.5 03/04/2018   INSULIN 14.2 11/01/2017   INSULIN 11.0 07/13/2017   CBC    Component Value Date/Time   WBC 6.8 03/04/2018 0740   WBC 12.2 (H) 01/13/2010 0205   RBC 4.44 03/04/2018 0740   RBC 5.05 01/13/2010 0205   HGB 11.7 03/04/2018 0740   HCT 37.1 03/04/2018 0740   PLT 383 01/13/2010 0205   MCV 84 03/04/2018 0740   MCH 26.4 (L) 03/04/2018 0740   MCHC 31.5 03/04/2018 0740   MCHC 33.9 01/13/2010 0205   RDW  14.6 03/04/2018 0740   LYMPHSABS 2.4 03/04/2018 0740   MONOABS 0.7 01/13/2010 0205   EOSABS 0.1 03/04/2018 0740   BASOSABS 0.0 03/04/2018 0740   Iron/TIBC/Ferritin/ %Sat No results found for: IRON, TIBC, FERRITIN, IRONPCTSAT Lipid Panel     Component Value Date/Time   CHOL 159 03/04/2018 0740   TRIG 44 03/04/2018 0740   HDL 72 03/04/2018 0740   LDLCALC 78 03/04/2018 0740   Hepatic Function Panel     Component Value Date/Time   PROT 6.7 03/04/2018 0740   ALBUMIN 3.8 03/04/2018 0740   AST 14 03/04/2018 0740   ALT 7 03/04/2018 0740   ALKPHOS 83 03/04/2018 0740   BILITOT 0.3 03/04/2018 0740   BILIDIR 0.2 01/13/2010 0205   IBILI 0.8 01/13/2010 0205      Component Value Date/Time   TSH 4.090 03/04/2018 0740   TSH 1.650 07/13/2017 1030   Results for ZARI, CLY (MRN [id number]) as of 04/10/2018 07:50  Ref. Range 03/04/2018 07:40  Vitamin D, 25-Hydroxy Latest Ref Range: 30.0 - 100.0 ng/mL 37.4   ASSESSMENT AND PLAN: Vitamin D deficiency  Class 2 severe obesity with serious comorbidity and body mass index (BMI) of 38.0 to 38.9 in adult, unspecified obesity type ([place])  PLAN:  Vitamin D Deficiency Heather Boone was informed that low vitamin D levels contributes to fatigue and are associated with obesity, breast, and colon cancer. She agrees to continue to take prescription Vit D @50 ,000 IU every week and will follow up for routine testing of vitamin D, at least 2-3 times per year. She was informed of the risk of over-replacement of vitamin D and agrees to not increase her dose unless she discusses this with [place] first.  We spent > than 50% of the 15 minute visit on the counseling as documented in the note.  Obesity Heather Boone is currently in the action stage of change. As such, her goal is to continue with weight loss efforts She has agreed to follow the Category 2 plan Heather Boone has been instructed to work up to a goal of 150 minutes of combined cardio and strengthening exercise  per week for weight loss and overall health benefits. We discussed the following Behavioral Modification Strategies today: increasing lean protein intake and increase H2O intake  Heather Boone has agreed to follow up with our clinic in 2 weeks. She was informed of the importance of frequent follow up visits to maximize her success with intensive lifestyle modifications for her multiple health conditions.    OBESITY BEHAVIORAL INTERVENTION VISIT  Today's visit was # 14 out of 22.  Starting weight: 231 lbs Starting date: 07/13/17 Today's weight : 231  lbs  Today's date: 04/09/2018 Total lbs lost to date: 13 (Patients must lose 7 lbs in the first 6 months to continue with counseling)   ASK: We discussed the diagnosis of obesity with Heather Boone today and Heather Boone agreed to give [place] permission to discuss obesity behavioral modification therapy today.  ASSESS: Heather Boone has the diagnosis of obesity and her BMI today is 38.63 Heather Boone is in the action stage of change   ADVISE: Heather Boone was educated on the multiple health risks of obesity as well as the benefit of weight loss to improve her health. She was advised of the need for long term treatment and the importance of lifestyle modifications.  AGREE: Multiple dietary modification options and treatment options were discussed and  Heather Boone agreed to the above obesity treatment plan.   Corey Skains, am acting as transcriptionist for [employer], PA-C I, Lacy Duverney [facility], have reviewed this note and agree with its content

## 2018-04-29 ENCOUNTER — Ambulatory Visit (INDEPENDENT_AMBULATORY_CARE_PROVIDER_SITE_OTHER): Payer: Commercial Managed Care - PPO | Admitting: Physician Assistant

## 2018-05-13 ENCOUNTER — Ambulatory Visit (INDEPENDENT_AMBULATORY_CARE_PROVIDER_SITE_OTHER): Payer: Commercial Managed Care - PPO | Admitting: Physician Assistant

## 2018-05-13 VITALS — BP 115/73 | HR 67 | Temp 97.5°F | Ht 63.0 in | Wt 214.0 lb

## 2018-05-13 DIAGNOSIS — E66812 Obesity, class 2: Secondary | ICD-10-CM

## 2018-05-13 DIAGNOSIS — E8881 Metabolic syndrome: Secondary | ICD-10-CM | POA: Diagnosis not present

## 2018-05-13 DIAGNOSIS — F3289 Other specified depressive episodes: Secondary | ICD-10-CM

## 2018-05-13 DIAGNOSIS — E88819 Insulin resistance, unspecified: Secondary | ICD-10-CM

## 2018-05-13 DIAGNOSIS — Z9189 Other specified personal risk factors, not elsewhere classified: Secondary | ICD-10-CM | POA: Diagnosis not present

## 2018-05-13 DIAGNOSIS — E559 Vitamin D deficiency, unspecified: Secondary | ICD-10-CM

## 2018-05-13 DIAGNOSIS — Z6838 Body mass index (BMI) 38.0-38.9, adult: Secondary | ICD-10-CM | POA: Diagnosis not present

## 2018-05-13 MED ORDER — METFORMIN HCL 500 MG PO TABS
500.0000 mg | ORAL_TABLET | Freq: Two times a day (BID) | ORAL | 0 refills | Status: DC
Start: 2018-05-13 — End: 2018-06-04

## 2018-05-13 MED ORDER — VITAMIN D (ERGOCALCIFEROL) 1.25 MG (50000 UNIT) PO CAPS: 50000.0000 [IU] | ORAL_CAPSULE | ORAL | 0 refills | Status: DC

## 2018-05-13 MED ORDER — BUPROPION HCL ER (SR) 200 MG PO TB12: 200.0000 mg | ORAL_TABLET | Freq: Every day | ORAL | 0 refills | Status: DC

## 2018-05-14 NOTE — Progress Notes (Signed)
Office: [phone]  /  Fax: [phone]   HPI:   Chief Complaint: OBESITY Heather Boone is here to discuss her progress with her obesity treatment plan. She is on the Category 2 plan and is following her eating plan approximately 85 % of the time. She states she is doing cardio for 90 minutes 4 times per week. [place] continues to do well with weight loss. She has been following the structured meal plan more closely. She would like to try different meal ideas. Her weight is 214 lb (97.1 kg) today and has had a weight loss of 4 pounds over a period of 5 weeks since her last visit. She has lost 17 lbs since starting treatment with [place].  Vitamin D deficiency Heather Boone has a diagnosis of vitamin D deficiency. She is currently taking vit D and denies nausea, vomiting or muscle weakness.  At risk for osteopenia and osteoporosis Heather Boone is at higher risk of osteopenia and osteoporosis due to vitamin D deficiency.   Insulin Resistance Heather Boone has a diagnosis of insulin resistance based on her elevated fasting insulin level >5. Although Heather Boone's blood glucose readings are still under good control, insulin resistance puts her at greater risk of metabolic syndrome and diabetes. She is taking metformin currently and continues to work on diet and exercise to decrease risk of diabetes. Heather Boone denies polyphagia.  Depression with emotional eating behaviors Heather Boone is struggling with emotional eating and using food for comfort to the extent that it is negatively impacting her health. She often snacks when she is not hungry. Heather Boone sometimes feels she is out of control and then feels guilty that she made poor food choices. She has been working on behavior modification techniques to help reduce her emotional eating and has been somewhat successful. Her mood is stable and she shows no sign of suicidal or homicidal ideations.  Depression screen PHQ 2/9 07/13/2017  Decreased Interest 3  Down, Depressed,  Hopeless 1  PHQ - 2 Score 4  Altered sleeping 3  Tired, decreased energy 3  Change in appetite 2  Feeling bad or failure about yourself  0  Trouble concentrating 0  Moving slowly or fidgety/restless 0  Suicidal thoughts 0  PHQ-9 Score 12      ALLERGIES: Allergies  Allergen Reactions  . Aleve [Naproxen Sodium]   . Motrin [Ibuprofen]   . Oxycodone-Acetaminophen   . Tramadol     MEDICATIONS: No current outpatient medications on file prior to visit.   No current facility-administered medications on file prior to visit.     PAST MEDICAL HISTORY: Past Medical History:  Diagnosis Date  . Back pain   . Constipation   . Heartburn   . Obesity     PAST SURGICAL HISTORY: Past Surgical History:  Procedure Laterality Date  . LAPAROSCOPIC GASTRIC BANDING     dec 2011    SOCIAL HISTORY: Social History   Tobacco Use  . Smoking status: Never Smoker  . Smokeless tobacco: Never Used  Substance Use Topics  . Alcohol use: No  . Drug use: No    FAMILY HISTORY: Family History  Problem Relation Age of Onset  . Hyperlipidemia Mother   . Hyperlipidemia Father   . Heart disease Father   . Cancer Father   . Obesity Father     ROS: Review of Systems  Constitutional: Positive for weight loss.  Gastrointestinal: Negative for nausea and vomiting.  Musculoskeletal:       Negative for muscle weakness  Endo/Heme/Allergies:  Negative for polyphagia  Psychiatric/Behavioral: Positive for depression. Negative for suicidal ideas.    PHYSICAL EXAM: Blood pressure 115/73, pulse 67, temperature (!) 97.5 F (36.4 C), height 5\' 3"  (1.6 m), weight 214 lb (97.1 kg), SpO2 100 %. Body mass index is 37.91 kg/m. Physical Exam  Constitutional: She is oriented to person, place, and time. She appears well-developed and well-nourished.  Cardiovascular: Normal rate.  Pulmonary/Chest: Effort normal.  Musculoskeletal: Normal range of motion.  Neurological: She is oriented to person,  place, and time.  Skin: Skin is warm and dry.  Psychiatric: She has a normal mood and affect. Her behavior is normal.  Vitals reviewed.   RECENT LABS AND TESTS: BMET    Component Value Date/Time   NA 142 03/04/2018 0740   K 4.2 03/04/2018 0740   CL 107 (H) 03/04/2018 0740   CO2 20 03/04/2018 0740   GLUCOSE 85 03/04/2018 0740   GLUCOSE 93 01/13/2010 0220   BUN 13 03/04/2018 0740   CREATININE 0.91 03/04/2018 0740   CALCIUM 9.0 03/04/2018 0740   GFRNONAA 83 03/04/2018 0740   GFRAA 95 03/04/2018 0740   Lab Results  Component Value Date   HGBA1C 5.2 03/04/2018   HGBA1C 5.1 11/01/2017   HGBA1C 5.5 07/13/2017   Lab Results  Component Value Date   INSULIN 8.5 03/04/2018   INSULIN 14.2 11/01/2017   INSULIN 11.0 07/13/2017   CBC    Component Value Date/Time   WBC 6.8 03/04/2018 0740   WBC 12.2 (H) 01/13/2010 0205   RBC 4.44 03/04/2018 0740   RBC 5.05 01/13/2010 0205   HGB 11.7 03/04/2018 0740   HCT 37.1 03/04/2018 0740   PLT 383 01/13/2010 0205   MCV 84 03/04/2018 0740   MCH 26.4 (L) 03/04/2018 0740   MCHC 31.5 03/04/2018 0740   MCHC 33.9 01/13/2010 0205   RDW 14.6 03/04/2018 0740   LYMPHSABS 2.4 03/04/2018 0740   MONOABS 0.7 01/13/2010 0205   EOSABS 0.1 03/04/2018 0740   BASOSABS 0.0 03/04/2018 0740   Iron/TIBC/Ferritin/ %Sat No results found for: IRON, TIBC, FERRITIN, IRONPCTSAT Lipid Panel     Component Value Date/Time   CHOL 159 03/04/2018 0740   TRIG 44 03/04/2018 0740   HDL 72 03/04/2018 0740   LDLCALC 78 03/04/2018 0740   Hepatic Function Panel     Component Value Date/Time   PROT 6.7 03/04/2018 0740   ALBUMIN 3.8 03/04/2018 0740   AST 14 03/04/2018 0740   ALT 7 03/04/2018 0740   ALKPHOS 83 03/04/2018 0740   BILITOT 0.3 03/04/2018 0740   BILIDIR 0.2 01/13/2010 0205   IBILI 0.8 01/13/2010 0205      Component Value Date/Time   TSH 4.090 03/04/2018 0740   TSH 1.650 07/13/2017 1030   Results for Heather Boone (MRN [id number]) as of  05/14/2018 07:18  Ref. Range 03/04/2018 07:40  Vitamin D, 25-Hydroxy Latest Ref Range: 30.0 - 100.0 ng/mL 37.4   ASSESSMENT AND PLAN: Vitamin D deficiency - Plan: Vitamin D, Ergocalciferol, (DRISDOL) 50000 units CAPS capsule  Insulin resistance - Plan: metFORMIN (GLUCOPHAGE) 500 MG tablet  Other depression - with emotional eating - Plan: buPROPion (WELLBUTRIN SR) 200 MG 12 hr tablet  At risk for osteoporosis  Class 2 severe obesity with serious comorbidity and body mass index (BMI) of 38.0 to 38.9 in adult, unspecified obesity type ([place])  PLAN:  Vitamin D Deficiency Heather Boone was informed that low vitamin D levels contributes to fatigue and are associated with obesity, breast, and colon cancer.  She agrees to continue to take prescription Vit D @50 ,000 IU every week #4 with no refills and will follow up for routine testing of vitamin D, at least 2-3 times per year. She was informed of the risk of over-replacement of vitamin D and agrees to not increase her dose unless she discusses this with [place] first. Heather Boone agrees to follow up as directed.  At risk for osteopenia and osteoporosis Heather Boone is at risk for osteopenia and osteoporosis due to her vitamin D deficiency. She was encouraged to take her vitamin D and follow her higher calcium diet and increase strengthening exercise to help strengthen her bones and decrease her risk of osteopenia and osteoporosis.  Insulin Resistance Heather Boone will continue to work on weight loss, exercise, and decreasing simple carbohydrates in her diet to help decrease the risk of diabetes. We dicussed metformin including benefits and risks. She was informed that eating too many simple carbohydrates or too many calories at one sitting increases the likelihood of GI side effects. Heather Boone requested metformin for now and prescription was written today for 1 month refill. Heather Boone agreed to follow up with [place] as directed to monitor her progress.  Depression with  Emotional Eating Behaviors We discussed behavior modification techniques today to help Heather Boone deal with her emotional eating and depression. She has agreed to take Wellbutrin SR 200 mg qd #30 with no refills and follow up as directed.  Obesity Heather Boone is currently in the action stage of change. As such, her goal is to continue with weight loss efforts She has agreed to follow the Pescatarian eating plan Heather Boone has been instructed to work up to a goal of 150 minutes of combined cardio and strengthening exercise per week for weight loss and overall health benefits. We discussed the following Behavioral Modification Strategies today: increasing lean protein intake and work on meal planning and easy cooking plans  Heather Boone has agreed to follow up with our clinic in 3 weeks. She was informed of the importance of frequent follow up visits to maximize her success with intensive lifestyle modifications for her multiple health conditions.   OBESITY BEHAVIORAL INTERVENTION VISIT  Today's visit was # 15 out of 22.  Starting weight: 231 lbs Starting date: 07/13/17 Today's weight : 214 lbs  Today's date: 05/13/2018 Total lbs lost to date: 17 (Patients must lose 7 lbs in the first 6 months to continue with counseling)   ASK: We discussed the diagnosis of obesity with Heather Boone today and Heather Boone agreed to give [place] permission to discuss obesity behavioral modification therapy today.  ASSESS: Heather Boone has the diagnosis of obesity and her BMI today is 37.92 Heather Boone is in the action stage of change   ADVISE: Heather Boone was educated on the multiple health risks of obesity as well as the benefit of weight loss to improve her health. She was advised of the need for long term treatment and the importance of lifestyle modifications.  AGREE: Multiple dietary modification options and treatment options were discussed and  Heather Boone agreed to the above obesity treatment plan.   Corey Skains,  am acting as transcriptionist for [employer], PA-C I, Lacy Duverney [facility], have reviewed this note and agree with its content

## 2018-06-04 ENCOUNTER — Ambulatory Visit (INDEPENDENT_AMBULATORY_CARE_PROVIDER_SITE_OTHER): Payer: Commercial Managed Care - PPO | Admitting: Physician Assistant

## 2018-06-04 VITALS — BP 118/78 | HR 70 | Temp 97.9°F | Ht 63.0 in | Wt 214.0 lb

## 2018-06-04 DIAGNOSIS — F3289 Other specified depressive episodes: Secondary | ICD-10-CM

## 2018-06-04 DIAGNOSIS — E559 Vitamin D deficiency, unspecified: Secondary | ICD-10-CM

## 2018-06-04 DIAGNOSIS — Z6837 Body mass index (BMI) 37.0-37.9, adult: Secondary | ICD-10-CM | POA: Diagnosis not present

## 2018-06-04 DIAGNOSIS — Z9189 Other specified personal risk factors, not elsewhere classified: Secondary | ICD-10-CM | POA: Diagnosis not present

## 2018-06-04 DIAGNOSIS — E8881 Metabolic syndrome: Secondary | ICD-10-CM | POA: Diagnosis not present

## 2018-06-04 MED ORDER — VITAMIN D (ERGOCALCIFEROL) 1.25 MG (50000 UNIT) PO CAPS: 50000.0000 [IU] | ORAL_CAPSULE | ORAL | 0 refills | Status: DC

## 2018-06-04 MED ORDER — METFORMIN HCL 500 MG PO TABS: 500.0000 mg | ORAL_TABLET | Freq: Two times a day (BID) | ORAL | 0 refills | Status: DC

## 2018-06-04 MED ORDER — BUPROPION HCL ER (SR) 200 MG PO TB12: 200.0000 mg | ORAL_TABLET | Freq: Every day | ORAL | 0 refills | Status: DC

## 2018-06-04 NOTE — Progress Notes (Signed)
Office: [phone]  /  Fax: [phone]   HPI:   Chief Complaint: OBESITY Heather Boone is here to discuss her progress with her obesity treatment plan. She is on the Pescatarian eating plan and is following her eating plan approximately 55 % of the time. She states she is doing cardio and weights for 60 minutes 4 times per week. Heather Boone maintained her weight. She states she enjoyed Pescatarian meal plan and would like to continue with it.  Her weight is 214 lb (97.1 kg) today and has not lost weight since her last visit. She has lost 17 lbs since starting treatment with [place].  Vitamin D Deficiency Heather Boone has a diagnosis of vitamin D deficiency. She is currently taking prescription Vit D and denies nausea, vomiting or muscle weakness.  At risk for osteopenia and osteoporosis Heather Boone is at higher risk of osteopenia and osteoporosis due to vitamin D deficiency.   Insulin Resistance Heather Boone has a diagnosis of insulin resistance based on her elevated fasting insulin level >5. Although Heather Boone blood glucose readings are still under good control, insulin resistance puts her at greater risk of metabolic syndrome and diabetes. She is taking metformin currently and continues to work on diet and exercise to decrease risk of diabetes. She denies polyphagia.  Depression with emotional eating behaviors Heather Boone is struggling with emotional eating and using food for comfort to the extent that it is negatively impacting her health. She often snacks when she is not hungry. Heather Boone sometimes feels she is out of control and then feels guilty that she made poor food choices. She has been working on behavior modification techniques to help reduce her emotional eating and has been somewhat successful. Her mood is stable and she shows no sign of suicidal or homicidal ideations.  Depression screen PHQ 2/9 07/13/2017  Decreased Interest 3  Down, Depressed, Hopeless 1  PHQ - 2 Score 4  Altered sleeping 3    Tired, decreased energy 3  Change in appetite 2  Feeling bad or failure about yourself  0  Trouble concentrating 0  Moving slowly or fidgety/restless 0  Suicidal thoughts 0  PHQ-9 Score 12    ALLERGIES: Allergies  Allergen Reactions  . Aleve [Naproxen Sodium]   . Motrin [Ibuprofen]   . Oxycodone-Acetaminophen   . Tramadol     MEDICATIONS: Current Outpatient Medications on File Prior to Visit  Medication Sig Dispense Refill  . buPROPion (WELLBUTRIN SR) 200 MG 12 hr tablet Take 1 tablet (200 mg total) by mouth daily at 12 noon. 30 tablet 0  . metFORMIN (GLUCOPHAGE) 500 MG tablet Take 1 tablet (500 mg total) by mouth 2 (two) times daily with a meal. 60 tablet 0  . Vitamin D, Ergocalciferol, (DRISDOL) 50000 units CAPS capsule Take 1 capsule (50,000 Units total) by mouth every 7 (seven) days. 4 capsule 0   No current facility-administered medications on file prior to visit.     PAST MEDICAL HISTORY: Past Medical History:  Diagnosis Date  . Back pain   . Constipation   . Heartburn   . Obesity     PAST SURGICAL HISTORY: Past Surgical History:  Procedure Laterality Date  . LAPAROSCOPIC GASTRIC BANDING     dec 2011    SOCIAL HISTORY: Social History   Tobacco Use  . Smoking status: Never Smoker  . Smokeless tobacco: Never Used  Substance Use Topics  . Alcohol use: No  . Drug use: No    FAMILY HISTORY: Family History  Problem Relation Age of  Onset  . Hyperlipidemia Mother   . Hyperlipidemia Father   . Heart disease Father   . Cancer Father   . Obesity Father     ROS: Review of Systems  Constitutional: Negative for weight loss.  Gastrointestinal: Negative for nausea and vomiting.  Musculoskeletal:       Negative muscle weakness  Endo/Heme/Allergies:       Negative polyphagia  Psychiatric/Behavioral: Positive for depression. Negative for suicidal ideas.    PHYSICAL EXAM: Blood pressure 118/78, pulse 70, temperature 97.9 F (36.6 C), temperature  source Oral, height 5\' 3"  (1.6 m), weight 214 lb (97.1 kg), SpO2 100 %. Body mass index is 37.91 kg/m. Physical Exam  Constitutional: She is oriented to person, place, and time. She appears well-developed and well-nourished.  Cardiovascular: Normal rate.  Pulmonary/Chest: Effort normal.  Musculoskeletal: Normal range of motion.  Neurological: She is oriented to person, place, and time.  Skin: Skin is warm and dry.  Psychiatric: She has a normal mood and affect. Her behavior is normal.  Vitals reviewed.   RECENT LABS AND TESTS: BMET    Component Value Date/Time   NA 142 03/04/2018 0740   K 4.2 03/04/2018 0740   CL 107 (H) 03/04/2018 0740   CO2 20 03/04/2018 0740   GLUCOSE 85 03/04/2018 0740   GLUCOSE 93 01/13/2010 0220   BUN 13 03/04/2018 0740   CREATININE 0.91 03/04/2018 0740   CALCIUM 9.0 03/04/2018 0740   GFRNONAA 83 03/04/2018 0740   GFRAA 95 03/04/2018 0740   Lab Results  Component Value Date   HGBA1C 5.2 03/04/2018   HGBA1C 5.1 11/01/2017   HGBA1C 5.5 07/13/2017   Lab Results  Component Value Date   INSULIN 8.5 03/04/2018   INSULIN 14.2 11/01/2017   INSULIN 11.0 07/13/2017   CBC    Component Value Date/Time   WBC 6.8 03/04/2018 0740   WBC 12.2 (H) 01/13/2010 0205   RBC 4.44 03/04/2018 0740   RBC 5.05 01/13/2010 0205   HGB 11.7 03/04/2018 0740   HCT 37.1 03/04/2018 0740   PLT 383 01/13/2010 0205   MCV 84 03/04/2018 0740   MCH 26.4 (L) 03/04/2018 0740   MCHC 31.5 03/04/2018 0740   MCHC 33.9 01/13/2010 0205   RDW 14.6 03/04/2018 0740   LYMPHSABS 2.4 03/04/2018 0740   MONOABS 0.7 01/13/2010 0205   EOSABS 0.1 03/04/2018 0740   BASOSABS 0.0 03/04/2018 0740   Iron/TIBC/Ferritin/ %Sat No results found for: IRON, TIBC, FERRITIN, IRONPCTSAT Lipid Panel     Component Value Date/Time   CHOL 159 03/04/2018 0740   TRIG 44 03/04/2018 0740   HDL 72 03/04/2018 0740   LDLCALC 78 03/04/2018 0740   Hepatic Function Panel     Component Value Date/Time   PROT  6.7 03/04/2018 0740   ALBUMIN 3.8 03/04/2018 0740   AST 14 03/04/2018 0740   ALT 7 03/04/2018 0740   ALKPHOS 83 03/04/2018 0740   BILITOT 0.3 03/04/2018 0740   BILIDIR 0.2 01/13/2010 0205   IBILI 0.8 01/13/2010 0205      Component Value Date/Time   TSH 4.090 03/04/2018 0740   TSH 1.650 07/13/2017 1030  Results for SHARAE, ZAPPULLA (MRN [id number]) as of 06/04/2018 17:29  Ref. Range 03/04/2018 07:40  Vitamin D, 25-Hydroxy Latest Ref Range: 30.0 - 100.0 ng/mL 37.4    ASSESSMENT AND PLAN: Vitamin D deficiency - Plan: Vitamin D, Ergocalciferol, (DRISDOL) 50000 units CAPS capsule  Insulin resistance - Plan: metFORMIN (GLUCOPHAGE) 500 MG tablet  Other depression -  with emotional eating - Plan: buPROPion (WELLBUTRIN SR) 200 MG 12 hr tablet  At risk for osteoporosis  Class 2 severe obesity with serious comorbidity and body mass index (BMI) of 37.0 to 37.9 in adult, unspecified obesity type ([place])  PLAN:  Vitamin D Deficiency Heather Boone was informed that low vitamin D levels contributes to fatigue and are associated with obesity, breast, and colon cancer. Heather Boone agrees to continue taking prescription Vit D @50 ,000 IU every week #4 and we will refill for 1 month. She will follow up for routine testing of vitamin D, at least 2-3 times per year. She was informed of the risk of over-replacement of vitamin D and agrees to not increase her dose unless she discusses this with [place] first. Jazlin agrees to follow up with our clinic in 2 weeks.  At risk for osteopenia and osteoporosis Heather Boone is at risk for osteopenia and osteoporsis due to her vitamin D deficiency. She was encouraged to take her vitamin D and follow her higher calcium diet and increase strengthening exercise to help strengthen her bones and decrease her risk of osteopenia and osteoporosis.  Insulin Resistance Heather Boone will continue to work on weight loss, exercise, and decreasing simple carbohydrates in her diet to help decrease  the risk of diabetes. We dicussed metformin including benefits and risks. She was informed that eating too many simple carbohydrates or too many calories at one sitting increases the likelihood of GI side effects. Heather Boone agrees to continue taking metformin 500 mg BID #60 and we will refill for 1 month. Heather Boone agrees to follow up with our clinic in 2 weeks as directed to monitor her progress.  Depression with Emotional Eating Behaviors We discussed behavior modification techniques today to help Heather Boone deal with her emotional eating and depression. Heather Boone agrees to continue taking Wellbutrin SR 200 mg qd #30 and we will refill for 1 month. Heather Boone agrees to follow up with our clinic in 2 weeks.  Obesity Heather Boone is currently in the action stage of change. As such, her goal is to continue with weight loss efforts She has agreed to follow the Pescatarian eating plan Heather Boone has been instructed to work up to a goal of 150 minutes of combined cardio and strengthening exercise per week for weight loss and overall health benefits. We discussed the following Behavioral Modification Strategies today: increasing lean protein intake and work on meal planning and easy cooking plans   Heather Boone has agreed to follow up with our clinic in 2 weeks. She was informed of the importance of frequent follow up visits to maximize her success with intensive lifestyle modifications for her multiple health conditions.   OBESITY BEHAVIORAL INTERVENTION VISIT  Today's visit was # 16 out of 22.  Starting weight: 231 lbs Starting date: 07/13/17 Today's weight : 214 lbs  Today's date: 06/04/2018 Total lbs lost to date: 17 (Patients must lose 7 lbs in the first 6 months to continue with counseling)   ASK: We discussed the diagnosis of obesity with Heather Boone today and Heather Boone agreed to give [place] permission to discuss obesity behavioral modification therapy today.  ASSESS: Heather Boone has the diagnosis of  obesity and her BMI today is 37.92 Heather Boone is in the action stage of change   ADVISE: Heather Boone was educated on the multiple health risks of obesity as well as the benefit of weight loss to improve her health. She was advised of the need for long term treatment and the importance of lifestyle modifications.  AGREE: Multiple dietary modification  options and treatment options were discussed and  Heather Boone agreed to the above obesity treatment plan.   Wilhemena Durie, am acting as transcriptionist for [employer], [place] [facility], have reviewed this note and agree with its content

## 2018-06-19 ENCOUNTER — Ambulatory Visit (INDEPENDENT_AMBULATORY_CARE_PROVIDER_SITE_OTHER): Payer: Commercial Managed Care - PPO | Admitting: Physician Assistant

## 2018-06-19 VITALS — BP 109/74 | HR 68 | Temp 97.7°F | Ht 63.0 in | Wt 216.0 lb

## 2018-06-19 DIAGNOSIS — Z6838 Body mass index (BMI) 38.0-38.9, adult: Secondary | ICD-10-CM

## 2018-06-19 DIAGNOSIS — E559 Vitamin D deficiency, unspecified: Secondary | ICD-10-CM | POA: Diagnosis not present

## 2018-06-19 NOTE — Progress Notes (Signed)
Office: [phone]  /  Fax: [phone]   HPI:   Chief Complaint: OBESITY Heather Boone is here to discuss her progress with her obesity treatment plan. She is on the Pescatarian eating plan and is following her eating plan approximately 75 % of the time. She states she is doing cardio for 90 minutes 3 times per week. Heather Boone follows the Pescatarian meal plan for meal ideas but adds additional snack calories. She is encouraged to go back to strict journaling.  Her weight is 216 lb (98 kg) today and has gained 2 pounds since her last visit. She has lost 15 lbs since starting treatment with [place].  Vitamin D Deficiency Heather Boone has a diagnosis of vitamin D deficiency. She is currently taking prescription Vit D and denies nausea, vomiting or muscle weakness.  ALLERGIES: Allergies  Allergen Reactions  . Aleve [Naproxen Sodium]   . Motrin [Ibuprofen]   . Oxycodone-Acetaminophen   . Tramadol     MEDICATIONS: Current Outpatient Medications on File Prior to Visit  Medication Sig Dispense Refill  . buPROPion (WELLBUTRIN SR) 200 MG 12 hr tablet Take 1 tablet (200 mg total) by mouth daily at 12 noon. 30 tablet 0  . metFORMIN (GLUCOPHAGE) 500 MG tablet Take 1 tablet (500 mg total) by mouth 2 (two) times daily with a meal. 60 tablet 0  . Vitamin D, Ergocalciferol, (DRISDOL) 50000 units CAPS capsule Take 1 capsule (50,000 Units total) by mouth every 7 (seven) days. 4 capsule 0   No current facility-administered medications on file prior to visit.     PAST MEDICAL HISTORY: Past Medical History:  Diagnosis Date  . Back pain   . Constipation   . Heartburn   . Obesity     PAST SURGICAL HISTORY: Past Surgical History:  Procedure Laterality Date  . LAPAROSCOPIC GASTRIC BANDING     dec 2011    SOCIAL HISTORY: Social History   Tobacco Use  . Smoking status: Never Smoker  . Smokeless tobacco: Never Used  Substance Use Topics  . Alcohol use: No  . Drug use: No    FAMILY  HISTORY: Family History  Problem Relation Age of Onset  . Hyperlipidemia Mother   . Hyperlipidemia Father   . Heart disease Father   . Cancer Father   . Obesity Father     ROS: Review of Systems  Constitutional: Negative for weight loss.  Gastrointestinal: Negative for nausea and vomiting.  Musculoskeletal:       Negative muscle weakness    PHYSICAL EXAM: Blood pressure 109/74, pulse 68, temperature 97.7 F (36.5 C), temperature source Oral, height 5\' 3"  (1.6 m), weight 216 lb (98 kg), SpO2 99 %. Body mass index is 38.26 kg/m. Physical Exam  Constitutional: She is oriented to person, place, and time. She appears well-developed and well-nourished.  Cardiovascular: Normal rate.  Pulmonary/Chest: Effort normal.  Musculoskeletal: Normal range of motion.  Neurological: She is oriented to person, place, and time.  Skin: Skin is warm and dry.  Psychiatric: She has a normal mood and affect. Her behavior is normal.  Vitals reviewed.   RECENT LABS AND TESTS: BMET    Component Value Date/Time   NA 142 03/04/2018 0740   K 4.2 03/04/2018 0740   CL 107 (H) 03/04/2018 0740   CO2 20 03/04/2018 0740   GLUCOSE 85 03/04/2018 0740   GLUCOSE 93 01/13/2010 0220   BUN 13 03/04/2018 0740   CREATININE 0.91 03/04/2018 0740   CALCIUM 9.0 03/04/2018 0740   GFRNONAA 83  03/04/2018 0740   GFRAA 95 03/04/2018 0740   Lab Results  Component Value Date   HGBA1C 5.2 03/04/2018   HGBA1C 5.1 11/01/2017   HGBA1C 5.5 07/13/2017   Lab Results  Component Value Date   INSULIN 8.5 03/04/2018   INSULIN 14.2 11/01/2017   INSULIN 11.0 07/13/2017   CBC    Component Value Date/Time   WBC 6.8 03/04/2018 0740   WBC 12.2 (H) 01/13/2010 0205   RBC 4.44 03/04/2018 0740   RBC 5.05 01/13/2010 0205   HGB 11.7 03/04/2018 0740   HCT 37.1 03/04/2018 0740   PLT 383 01/13/2010 0205   MCV 84 03/04/2018 0740   MCH 26.4 (L) 03/04/2018 0740   MCHC 31.5 03/04/2018 0740   MCHC 33.9 01/13/2010 0205   RDW 14.6  03/04/2018 0740   LYMPHSABS 2.4 03/04/2018 0740   MONOABS 0.7 01/13/2010 0205   EOSABS 0.1 03/04/2018 0740   BASOSABS 0.0 03/04/2018 0740   Iron/TIBC/Ferritin/ %Sat No results found for: IRON, TIBC, FERRITIN, IRONPCTSAT Lipid Panel     Component Value Date/Time   CHOL 159 03/04/2018 0740   TRIG 44 03/04/2018 0740   HDL 72 03/04/2018 0740   LDLCALC 78 03/04/2018 0740   Hepatic Function Panel     Component Value Date/Time   PROT 6.7 03/04/2018 0740   ALBUMIN 3.8 03/04/2018 0740   AST 14 03/04/2018 0740   ALT 7 03/04/2018 0740   ALKPHOS 83 03/04/2018 0740   BILITOT 0.3 03/04/2018 0740   BILIDIR 0.2 01/13/2010 0205   IBILI 0.8 01/13/2010 0205      Component Value Date/Time   TSH 4.090 03/04/2018 0740   TSH 1.650 07/13/2017 1030  Results for KELISHA, DALL (MRN [id number]) as of 06/19/2018 10:07  Ref. Range 03/04/2018 07:40  Vitamin D, 25-Hydroxy Latest Ref Range: 30.0 - 100.0 ng/mL 37.4    ASSESSMENT AND PLAN: Vitamin D deficiency  Class 2 severe obesity with serious comorbidity and body mass index (BMI) of 38.0 to 38.9 in adult, unspecified obesity type ([place])  PLAN:  Vitamin D Deficiency Heather Boone was informed that low vitamin D levels contributes to fatigue and are associated with obesity, breast, and colon cancer. Heather Boone agrees to continue taking prescription Vit D @50 ,000 IU every week and will follow up for routine testing of vitamin D, at least 2-3 times per year. She was informed of the risk of over-replacement of vitamin D and agrees to not increase her dose unless she discusses this with [place] first. Heather Boone agrees to follow up with our clinic in 2 weeks.  We spent > than 50% of the 15 minute visit on the counseling as documented in the note.  Obesity Heather Boone is currently in the action stage of change. As such, her goal is to continue with weight loss efforts She has agreed to keep a food journal with 1200 calories and 85 grams of protein daily Heather Boone has  been instructed to work up to a goal of 150 minutes of combined cardio and strengthening exercise per week for weight loss and overall health benefits. We discussed the following Behavioral Modification Strategies today: increase H20 intake and keep a strict food journal   Heather Boone has agreed to follow up with our clinic in 2 weeks. She was informed of the importance of frequent follow up visits to maximize her success with intensive lifestyle modifications for her multiple health conditions.   OBESITY BEHAVIORAL INTERVENTION VISIT  Today's visit was # 17 out of 22.  Starting weight: 231 lbs  Starting date: 07/13/17 Today's weight : 216 lbs Today's date: 06/19/2018 Total lbs lost to date: 15 (Patients must lose 7 lbs in the first 6 months to continue with counseling)   ASK: We discussed the diagnosis of obesity with Heather Boone today and Heather Boone agreed to give [place] permission to discuss obesity behavioral modification therapy today.  ASSESS: Heather Boone has the diagnosis of obesity and her BMI today is 38.27 Heather Boone is in the action stage of change   ADVISE: Heather Boone was educated on the multiple health risks of obesity as well as the benefit of weight loss to improve her health. She was advised of the need for long term treatment and the importance of lifestyle modifications.  AGREE: Multiple dietary modification options and treatment options were discussed and  Heather Boone agreed to the above obesity treatment plan.   Wilhemena Durie, am acting as transcriptionist for Lacy Duverney, PA-C I, Lacy Duverney [facility], have reviewed this note and agree with its content

## 2018-07-05 ENCOUNTER — Encounter (INDEPENDENT_AMBULATORY_CARE_PROVIDER_SITE_OTHER): Payer: Self-pay | Admitting: Family Medicine

## 2018-07-09 ENCOUNTER — Encounter (INDEPENDENT_AMBULATORY_CARE_PROVIDER_SITE_OTHER): Payer: Self-pay

## 2018-07-09 ENCOUNTER — Ambulatory Visit (INDEPENDENT_AMBULATORY_CARE_PROVIDER_SITE_OTHER): Payer: Commercial Managed Care - PPO | Admitting: Physician Assistant

## 2018-08-01 ENCOUNTER — Encounter: Payer: Self-pay | Admitting: Obstetrics & Gynecology

## 2018-08-01 ENCOUNTER — Ambulatory Visit (INDEPENDENT_AMBULATORY_CARE_PROVIDER_SITE_OTHER): Payer: Commercial Managed Care - PPO | Admitting: Obstetrics & Gynecology

## 2018-08-01 VITALS — BP 128/84 | Ht 61.75 in | Wt 219.0 lb

## 2018-08-01 DIAGNOSIS — Z1151 Encounter for screening for human papillomavirus (HPV): Secondary | ICD-10-CM | POA: Diagnosis not present

## 2018-08-01 DIAGNOSIS — Z3169 Encounter for other general counseling and advice on procreation: Secondary | ICD-10-CM | POA: Diagnosis not present

## 2018-08-01 DIAGNOSIS — Z01419 Encounter for gynecological examination (general) (routine) without abnormal findings: Secondary | ICD-10-CM

## 2018-08-01 DIAGNOSIS — Z3002 Counseling and instruction in natural family planning to avoid pregnancy: Secondary | ICD-10-CM | POA: Diagnosis not present

## 2018-08-01 DIAGNOSIS — Z6841 Body Mass Index (BMI) 40.0 and over, adult: Secondary | ICD-10-CM

## 2018-08-01 MED ORDER — PNV PRENATAL PLUS MULTIVITAMIN 27-1 MG PO TABS
1.0000 | ORAL_TABLET | Freq: Every day | ORAL | 4 refills | Status: DC
Start: 2018-08-01 — End: 2019-04-22

## 2018-08-01 NOTE — Progress Notes (Signed)
Heather Boone Oct 06, 1981 [id number]   History:    37 y.o. G1P0A1 Engaged, getting married 06/28/2019.  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  RP:  New patient presenting for annual gyn exam   HPI: Menses regular normal every month.  No BTB.  No pelvic pain.  No pain with IC. Urine/BMs wnl.  Breasts wnl.  Followed by Nutritionist for weight loss, went from >300 Lbs to 219 Lbs currently.  BMI 40.38.  Exercises regularly.  Past medical history,surgical history, family history and social history were all reviewed and documented in the EPIC chart.  Gynecologic History Patient's last menstrual period was 07/11/2018. Contraception: rhythm method Last Pap: Per patient normal in 2017 Last mammogram: Never Bone Density: 2006 Normal Z-scores Colonoscopy: Never  Obstetric History OB History  Gravida Para Term Preterm AB Living  1 0       0  SAB TAB Ectopic Multiple Live Births               # Outcome Date GA Lbr Len/2nd Weight Sex Delivery Anes PTL Lv  1 Gravida              ROS: A ROS was performed and pertinent positives and negatives are included in the history.  GENERAL: No fevers or chills. HEENT: No change in vision, no earache, sore throat or sinus congestion. NECK: No pain or stiffness. CARDIOVASCULAR: No chest pain or pressure. No palpitations. PULMONARY: No shortness of breath, cough or wheeze. GASTROINTESTINAL: No abdominal pain, nausea, vomiting or diarrhea, melena or bright red blood per rectum. GENITOURINARY: No urinary frequency, urgency, hesitancy or dysuria. MUSCULOSKELETAL: No joint or muscle pain, no back pain, no recent trauma. DERMATOLOGIC: No rash, no itching, no lesions. ENDOCRINE: No polyuria, polydipsia, no heat or cold intolerance. No recent change in weight. HEMATOLOGICAL: No anemia or easy bruising or bleeding. NEUROLOGIC: No headache, seizures, numbness, tingling or weakness. PSYCHIATRIC: No depression, no loss of interest in normal activity or change in sleep  pattern.     Exam:   BP 128/84   Ht 5' 1.75" (1.568 m)   Wt 219 lb (99.3 kg)   LMP 07/11/2018   BMI 40.38 kg/m   Body mass index is 40.38 kg/m.  General appearance : Well developed well nourished female. No acute distress HEENT: Eyes: no retinal hemorrhage or exudates,  Neck supple, trachea midline, no carotid bruits, no thyroidmegaly Lungs: Clear to auscultation, no rhonchi or wheezes, or rib retractions  Heart: Regular rate and rhythm, no murmurs or gallops Breast:Examined in sitting and supine position were symmetrical in appearance, no palpable masses or tenderness,  no skin retraction, no nipple inversion, no nipple discharge, no skin discoloration, no axillary or supraclavicular lymphadenopathy Abdomen: no palpable masses or tenderness, no rebound or guarding Extremities: no edema or skin discoloration or tenderness  Pelvic: Vulva: Normal             Vagina: No gross lesions or discharge  Cervix: No gross lesions or discharge.  Pap/HPV HR done.  Uterus  AV, normal size, shape and consistency, non-tender and mobile  Adnexa  Without masses or tenderness  Anus: Normal   Assessment/Plan:  37 y.o. female for annual exam   1. Encounter for routine gynecological examination with Papanicolaou smear of cervix Normal gynecologic exam.  Pap with high-risk HPV done today.  Breast exam normal. - PAP,TP IMGw/HPV RNA,rflx [id number],22/02  2. Special screening examination for human papillomavirus (HPV) - PAP,TP IMGw/HPV RNA,rflx [id number]/83  3. Encounter for counseling  and instruction in natural family planning to avoid pregnancy Declines contraception.  Will continue with natural family planning until her wedding next summer.  Would be okay if conceived at any point now.  Recommend starting on prenatal vitamins.  Prescription sent to pharmacy.  4. Class 3 severe obesity due to excess calories without serious comorbidity with body mass index (BMI) of 40.0 to 44.9 in adult  [facility]) Followed by a nutritionist and successful in losing weight.  We will continue with same weight loss plan including low calorie/carb diet and regular physical activity.  5. Encounter for preconception consultation 37 year old planning to start attempting conception in a year.  Will start on prenatal vitamins now.  Continue with weight loss and healthy nutrition, as well as fitness.  Other orders - Prenatal Vit-Fe Fumarate-FA (PNV PRENATAL PLUS MULTIVITAMIN) 27-1 MG TABS; Take 1 tablet by mouth daily.  Counseling on above issues and coordination of care more than 50% for 10 minutes.  [name], 3:18 PM 08/01/2018

## 2018-08-02 ENCOUNTER — Encounter: Payer: Self-pay | Admitting: Obstetrics & Gynecology

## 2018-08-02 NOTE — Patient Instructions (Signed)
1. Encounter for routine gynecological examination with Papanicolaou smear of cervix Normal gynecologic exam.  Pap with high-risk HPV done today.  Breast exam normal. - PAP,TP IMGw/HPV RNA,rflx [id number],62/22  2. Special screening examination for human papillomavirus (HPV) - PAP,TP IMGw/HPV RNA,rflx [id number],41/74  3. Encounter for counseling and instruction in natural family planning to avoid pregnancy Declines contraception.  Will continue with natural family planning until her wedding next summer.  Would be okay if conceived at any point now.  Recommend starting on prenatal vitamins.  Prescription sent to pharmacy.  4. Class 3 severe obesity due to excess calories without serious comorbidity with body mass index (BMI) of 40.0 to 44.9 in adult [facility]) Followed by a nutritionist and successful in losing weight.  We will continue with same weight loss plan including low calorie/carb diet and regular physical activity.  5. Encounter for preconception consultation 37 year old planning to start attempting conception in a year.  Will start on prenatal vitamins now.  Continue with weight loss and healthy nutrition, as well as fitness.  Other orders - Prenatal Vit-Fe Fumarate-FA (PNV PRENATAL PLUS MULTIVITAMIN) 27-1 MG TABS; Take 1 tablet by mouth daily.  Heather Boone, it was a pleasure meeting you today!  I will inform you of your results as soon as they are available.

## 2018-08-08 LAB — PAP, TP IMAGING W/ HPV RNA, RFLX HPV TYPE 16,18/45: HPV DNA High Risk: DETECTED — AB

## 2018-08-08 LAB — HPV TYPE 16 AND 18/45 RNA
HPV Type 16 RNA: NOT DETECTED
HPV Type 18/45 RNA: NOT DETECTED

## 2018-08-09 ENCOUNTER — Encounter: Payer: Self-pay | Admitting: *Deleted

## 2018-10-17 ENCOUNTER — Telehealth (INDEPENDENT_AMBULATORY_CARE_PROVIDER_SITE_OTHER): Payer: Self-pay

## 2018-10-18 ENCOUNTER — Telehealth: Payer: Self-pay | Admitting: *Deleted

## 2018-10-18 MED ORDER — FLUCONAZOLE 150 MG PO TABS: 150.0000 mg | ORAL_TABLET | Freq: Every day | ORAL | 1 refills | Status: DC

## 2018-10-18 NOTE — Telephone Encounter (Signed)
Patient called c/o yeast infection, was on antibiotic for dental procedure, asked if diflucan tablet can be prescribed? Please advise

## 2018-10-18 NOTE — Telephone Encounter (Signed)
Rx sent, patient informed.

## 2018-10-18 NOTE — Telephone Encounter (Signed)
Agree with Fluconazole 150 mg 1 tab PO daily x 3.  Refill x 1.

## 2018-10-22 NOTE — Telephone Encounter (Signed)
Hello Heather Boone.  Per our phone conversation your appointment has been scheduled on Tuesday, 11/12 at 3:20.  We look forward to seeing you then.  Maudie Mercury

## 2018-11-04 ENCOUNTER — Telehealth (INDEPENDENT_AMBULATORY_CARE_PROVIDER_SITE_OTHER): Payer: Self-pay

## 2018-11-04 NOTE — Telephone Encounter (Signed)
Patient called to cancel and reschedule her 11/12 appt. Patient states her flight was cancelled today and wanted to know if she would still have to pay the fee for cancelling.

## 2018-11-05 ENCOUNTER — Ambulatory Visit (INDEPENDENT_AMBULATORY_CARE_PROVIDER_SITE_OTHER): Payer: Commercial Managed Care - PPO | Admitting: Family Medicine

## 2018-11-05 ENCOUNTER — Encounter (INDEPENDENT_AMBULATORY_CARE_PROVIDER_SITE_OTHER): Payer: Self-pay

## 2018-11-05 NOTE — Telephone Encounter (Signed)
Rescheduled appt with patient too 11/14 at 8:40.  [place] Show fee waived per Cira Servant.

## 2018-11-07 ENCOUNTER — Ambulatory Visit (INDEPENDENT_AMBULATORY_CARE_PROVIDER_SITE_OTHER): Payer: Managed Care, Other (non HMO) | Admitting: Family Medicine

## 2018-11-07 VITALS — BP 114/74 | HR 68 | Temp 98.1°F | Ht 63.0 in | Wt 213.0 lb

## 2018-11-07 DIAGNOSIS — Z9189 Other specified personal risk factors, not elsewhere classified: Secondary | ICD-10-CM | POA: Diagnosis not present

## 2018-11-07 DIAGNOSIS — F32A Depression, unspecified: Secondary | ICD-10-CM | POA: Insufficient documentation

## 2018-11-07 DIAGNOSIS — E559 Vitamin D deficiency, unspecified: Secondary | ICD-10-CM | POA: Diagnosis not present

## 2018-11-07 DIAGNOSIS — Z6837 Body mass index (BMI) 37.0-37.9, adult: Secondary | ICD-10-CM

## 2018-11-07 DIAGNOSIS — R7303 Prediabetes: Secondary | ICD-10-CM

## 2018-11-07 DIAGNOSIS — F3289 Other specified depressive episodes: Secondary | ICD-10-CM

## 2018-11-07 DIAGNOSIS — F329 Major depressive disorder, single episode, unspecified: Secondary | ICD-10-CM | POA: Insufficient documentation

## 2018-11-07 MED ORDER — BUPROPION HCL ER (SR) 200 MG PO TB12: 200.0000 mg | ORAL_TABLET | Freq: Every day | ORAL | 0 refills | Status: DC

## 2018-11-07 MED ORDER — VITAMIN D (ERGOCALCIFEROL) 1.25 MG (50000 UNIT) PO CAPS: 50000.0000 [IU] | ORAL_CAPSULE | ORAL | 0 refills | Status: DC

## 2018-11-07 MED ORDER — METFORMIN HCL 500 MG PO TABS: 500.0000 mg | ORAL_TABLET | Freq: Two times a day (BID) | ORAL | 0 refills | Status: DC

## 2018-11-11 ENCOUNTER — Encounter (INDEPENDENT_AMBULATORY_CARE_PROVIDER_SITE_OTHER): Payer: Self-pay | Admitting: Family Medicine

## 2018-11-11 NOTE — Progress Notes (Signed)
**Note De-Identified Heather Boone Obfuscation** Office: [phone]  /  Fax: [phone]   HPI:   Chief Complaint: OBESITY Heather Boone is here to discuss her progress with her obesity treatment plan. She is on the keep a food journal with 1200 calories and 85 grams of protein daily and is following her eating plan approximately 70 % of the time. She states she is walking for 40-45 minutes 3 times per week. Heather Boone continues to do well with weight loss. She is journals on and off, but she is mostly being mindful about her food choices.  Her weight is 213 lb (96.6 kg) today and has had a weight loss of 3 pounds over a period of 4 to 5 months since her last visit. She has lost 18 lbs since starting treatment with [place].  Vitamin D Deficiency Heather Boone has a diagnosis of vitamin D deficiency. She is stable on prescription Vit D and she is due for labs. She denies nausea, vomiting or muscle weakness.  Pre-Diabetes Heather Boone has a diagnosis of pre-diabetes based on her elevated Hgb A1c and was informed this puts her at greater risk of developing diabetes. She is stable on metformin, she is due for labs but not fasting. She continues to work on diet and exercise to decrease risk of diabetes. She denies nausea or hypoglycemia.  At risk for diabetes Heather Boone is at higher than average risk for developing diabetes due to her obesity and pre-diabetes. She currently denies polyuria or polydipsia.  Depression with emotional eating behaviors Heather Boone's mood is stable on Wellbutrin and she has done better with decreasing emotional eating. Heather Boone struggles with emotional eating and using food for comfort to the extent that it is negatively impacting her health. She often snacks when she is not hungry. Heather Boone sometimes feels she is out of control and then feels guilty that she made poor food choices. She has been working on behavior modification techniques to help reduce her emotional eating and has been somewhat successful. She shows no sign of suicidal or  homicidal ideations.  Depression screen PHQ 2/9 07/13/2017  Decreased Interest 3  Down, Depressed, Hopeless 1  PHQ - 2 Score 4  Altered sleeping 3  Tired, decreased energy 3  Change in appetite 2  Feeling bad or failure about yourself  0  Trouble concentrating 0  Moving slowly or fidgety/restless 0  Suicidal thoughts 0  PHQ-9 Score 12    ALLERGIES: Allergies  Allergen Reactions  . Aleve [Naproxen Sodium]   . Motrin [Ibuprofen]   . Oxycodone-Acetaminophen   . Tramadol     MEDICATIONS: Current Outpatient Medications on File Prior to Visit  Medication Sig Dispense Refill  . Prenatal Vit-Fe Fumarate-FA (PNV PRENATAL PLUS MULTIVITAMIN) 27-1 MG TABS Take 1 tablet by mouth daily. 90 tablet 4   No current facility-administered medications on file prior to visit.     PAST MEDICAL HISTORY: Past Medical History:  Diagnosis Date  . Back pain   . Constipation   . Heartburn   . Obesity     PAST SURGICAL HISTORY: Past Surgical History:  Procedure Laterality Date  . LAPAROSCOPIC GASTRIC BANDING     dec 2011    SOCIAL HISTORY: Social History   Tobacco Use  . Smoking status: Never Smoker  . Smokeless tobacco: Never Used  Substance Use Topics  . Alcohol use: No  . Drug use: No    FAMILY HISTORY: Family History  Problem Relation Age of Onset  . Hyperlipidemia Mother   . Hyperlipidemia Father   . Heart  disease Father   . Cancer Father   . Obesity Father     ROS: Review of Systems  Constitutional: Positive for weight loss.  Gastrointestinal: Negative for nausea and vomiting.  Genitourinary: Negative for frequency.  Musculoskeletal:       Negative muscle weakness  Endo/Heme/Allergies: Negative for polydipsia.       Negative hypoglycemia  Psychiatric/Behavioral: Positive for depression. Negative for suicidal ideas.    PHYSICAL EXAM: Blood pressure 114/74, pulse 68, temperature 98.1 F (36.7 C), temperature source Oral, height 5\' 3"  (1.6 m), weight 213 lb  (96.6 kg), last menstrual period 10/11/2018, SpO2 100 %. Body mass index is 37.73 kg/m. Physical Exam  Constitutional: She is oriented to person, place, and time. She appears well-developed and well-nourished.  Cardiovascular: Normal rate.  Pulmonary/Chest: Effort normal.  Musculoskeletal: Normal range of motion.  Neurological: She is oriented to person, place, and time.  Skin: Skin is warm and dry.  Psychiatric: She has a normal mood and affect. Her behavior is normal.  Vitals reviewed.   RECENT LABS AND TESTS: BMET    Component Value Date/Time   NA 142 03/04/2018 0740   K 4.2 03/04/2018 0740   CL 107 (H) 03/04/2018 0740   CO2 20 03/04/2018 0740   GLUCOSE 85 03/04/2018 0740   GLUCOSE 93 01/13/2010 0220   BUN 13 03/04/2018 0740   CREATININE 0.91 03/04/2018 0740   CALCIUM 9.0 03/04/2018 0740   GFRNONAA 83 03/04/2018 0740   GFRAA 95 03/04/2018 0740   Lab Results  Component Value Date   HGBA1C 5.2 03/04/2018   HGBA1C 5.1 11/01/2017   HGBA1C 5.5 07/13/2017   Lab Results  Component Value Date   INSULIN 8.5 03/04/2018   INSULIN 14.2 11/01/2017   INSULIN 11.0 07/13/2017   CBC    Component Value Date/Time   WBC 6.8 03/04/2018 0740   WBC 12.2 (H) 01/13/2010 0205   RBC 4.44 03/04/2018 0740   RBC 5.05 01/13/2010 0205   HGB 11.7 03/04/2018 0740   HCT 37.1 03/04/2018 0740   PLT 383 01/13/2010 0205   MCV 84 03/04/2018 0740   MCH 26.4 (L) 03/04/2018 0740   MCHC 31.5 03/04/2018 0740   MCHC 33.9 01/13/2010 0205   RDW 14.6 03/04/2018 0740   LYMPHSABS 2.4 03/04/2018 0740   MONOABS 0.7 01/13/2010 0205   EOSABS 0.1 03/04/2018 0740   BASOSABS 0.0 03/04/2018 0740   Iron/TIBC/Ferritin/ %Sat No results found for: IRON, TIBC, FERRITIN, IRONPCTSAT Lipid Panel     Component Value Date/Time   CHOL 159 03/04/2018 0740   TRIG 44 03/04/2018 0740   HDL 72 03/04/2018 0740   LDLCALC 78 03/04/2018 0740   Hepatic Function Panel     Component Value Date/Time   PROT 6.7 03/04/2018  0740   ALBUMIN 3.8 03/04/2018 0740   AST 14 03/04/2018 0740   ALT 7 03/04/2018 0740   ALKPHOS 83 03/04/2018 0740   BILITOT 0.3 03/04/2018 0740   BILIDIR 0.2 01/13/2010 0205   IBILI 0.8 01/13/2010 0205      Component Value Date/Time   TSH 4.090 03/04/2018 0740   TSH 1.650 07/13/2017 1030  Results for TYSHIA, FENTER (MRN [id number]) as of 11/11/2018 13:56  Ref. Range 03/04/2018 07:40  Vitamin D, 25-Hydroxy Latest Ref Range: 30.0 - 100.0 ng/mL 37.4    ASSESSMENT AND PLAN: Vitamin D deficiency - Plan: Vitamin D, Ergocalciferol, (DRISDOL) 1.25 MG (50000 UT) CAPS capsule  Prediabetes - Plan: metFORMIN (GLUCOPHAGE) 500 MG tablet  Other depression - with  emotional eating  - Plan: buPROPion (WELLBUTRIN SR) 200 MG 12 hr tablet  At risk for diabetes mellitus  Class 2 severe obesity with serious comorbidity and body mass index (BMI) of 37.0 to 37.9 in adult, unspecified obesity type ([place])  PLAN:  Vitamin D Deficiency Heather Boone was informed that low vitamin D levels contributes to fatigue and are associated with obesity, breast, and colon cancer. Heather Boone agrees to continue taking prescription Vit D @50 ,000 IU every week #4 and we will refill for 1 month. She will follow up for routine testing of vitamin D, at least 2-3 times per year. She was informed of the risk of over-replacement of vitamin D and agrees to not increase her dose unless she discusses this with [place] first. We will recheck labs at next visit. Heather Boone agrees to follow up with our clinic in 3 weeks.  Pre-Diabetes Heather Boone will continue to work on weight loss, exercise, and decreasing simple carbohydrates in her diet to help decrease the risk of diabetes. We dicussed metformin including benefits and risks. She was informed that eating too many simple carbohydrates or too many calories at one sitting increases the likelihood of GI side effects. Journei agrees to continue taking metformin 500 mg BID #60 and we will refill for 1  month. We will recheck labs at next visit. Heather Boone agrees to follow up with our clinic in 3 weeks as directed to monitor her progress.  Diabetes risk counselling Heather Boone was given extended (15 minutes) diabetes prevention counseling today. She is 37 y.o. female and has risk factors for diabetes including obesity and pre-diabetes. We discussed intensive lifestyle modifications today with an emphasis on weight loss as well as increasing exercise and decreasing simple carbohydrates in her diet.  Depression with Emotional Eating Behaviors We discussed behavior modification techniques today to help Heather Boone deal with her emotional eating and depression. Heather Boone agrees to continue taking Wellbutrin SR 200 mg qd #30 and we will refill for 1 month. Heather Boone agrees to follow up with our clinic in 3 weeks.  Obesity Heather Boone is currently in the action stage of change. As such, her goal is to continue with weight loss efforts She has agreed to keep a food journal with 1200-1300 calories and 85 grams of protein daily Medea has been instructed to work up to a goal of 150 minutes of combined cardio and strengthening exercise per week for weight loss and overall health benefits. We discussed the following Behavioral Modification Strategies today: increasing lean protein intake, decreasing simple carbohydrates, holiday eating strategies, and keep a strict food journal    Conley has agreed to follow up with our clinic in 3 weeks. She was informed of the importance of frequent follow up visits to maximize her success with intensive lifestyle modifications for her multiple health conditions.   OBESITY BEHAVIORAL INTERVENTION VISIT  Today's visit was # 18   Starting weight: 231 lbs Starting date: 07/13/17 Today's weight : 213 lbs Today's date: 11/07/2018 Total lbs lost to date: 18    ASK: We discussed the diagnosis of obesity with Heather Boone today and Heather Boone agreed to give [place] permission to  discuss obesity behavioral modification therapy today.  ASSESS: Heather Boone has the diagnosis of obesity and her BMI today is 37.74 Heather Boone is in the action stage of change   ADVISE: Maressa was educated on the multiple health risks of obesity as well as the benefit of weight loss to improve her health. She was advised of the need for long term treatment  and the importance of lifestyle modifications to improve her current health and to decrease her risk of future health problems.  AGREE: Multiple dietary modification options and treatment options were discussed and  Lurleen agreed to follow the recommendations documented in the above note.  ARRANGE: Rielle was educated on the importance of frequent visits to treat obesity as outlined per CMS and USPSTF guidelines and agreed to schedule her next follow up appointment today.  I, Trixie Dredge, am acting as transcriptionist for Dennard Nip, MD  I have reviewed the above documentation for accuracy and completeness, and I agree with the above. -Dennard Nip, MD

## 2018-11-22 ENCOUNTER — Other Ambulatory Visit (INDEPENDENT_AMBULATORY_CARE_PROVIDER_SITE_OTHER): Payer: Self-pay | Admitting: Family Medicine

## 2018-11-22 DIAGNOSIS — F3289 Other specified depressive episodes: Secondary | ICD-10-CM

## 2018-11-22 DIAGNOSIS — R7303 Prediabetes: Secondary | ICD-10-CM

## 2018-11-22 DIAGNOSIS — E559 Vitamin D deficiency, unspecified: Secondary | ICD-10-CM

## 2018-11-26 ENCOUNTER — Other Ambulatory Visit (INDEPENDENT_AMBULATORY_CARE_PROVIDER_SITE_OTHER): Payer: Self-pay | Admitting: Family Medicine

## 2018-11-26 DIAGNOSIS — R7303 Prediabetes: Secondary | ICD-10-CM

## 2018-11-26 DIAGNOSIS — E559 Vitamin D deficiency, unspecified: Secondary | ICD-10-CM

## 2018-11-26 DIAGNOSIS — F3289 Other specified depressive episodes: Secondary | ICD-10-CM

## 2018-12-04 ENCOUNTER — Ambulatory Visit (INDEPENDENT_AMBULATORY_CARE_PROVIDER_SITE_OTHER): Payer: Managed Care, Other (non HMO) | Admitting: Family Medicine

## 2018-12-04 ENCOUNTER — Encounter (INDEPENDENT_AMBULATORY_CARE_PROVIDER_SITE_OTHER): Payer: Self-pay | Admitting: Family Medicine

## 2018-12-04 VITALS — BP 101/59 | HR 61 | Temp 97.7°F | Ht 63.0 in | Wt 214.0 lb

## 2018-12-04 DIAGNOSIS — Z9189 Other specified personal risk factors, not elsewhere classified: Secondary | ICD-10-CM

## 2018-12-04 DIAGNOSIS — E8881 Metabolic syndrome: Secondary | ICD-10-CM | POA: Diagnosis not present

## 2018-12-04 DIAGNOSIS — E559 Vitamin D deficiency, unspecified: Secondary | ICD-10-CM | POA: Diagnosis not present

## 2018-12-04 DIAGNOSIS — F3289 Other specified depressive episodes: Secondary | ICD-10-CM

## 2018-12-04 DIAGNOSIS — Z6838 Body mass index (BMI) 38.0-38.9, adult: Secondary | ICD-10-CM

## 2018-12-04 MED ORDER — VITAMIN D (ERGOCALCIFEROL) 1.25 MG (50000 UNIT) PO CAPS: 50000.0000 [IU] | ORAL_CAPSULE | ORAL | 0 refills | Status: DC

## 2018-12-04 MED ORDER — METFORMIN HCL 500 MG PO TABS: 500.0000 mg | ORAL_TABLET | Freq: Two times a day (BID) | ORAL | 0 refills | Status: DC

## 2018-12-04 MED ORDER — BUPROPION HCL ER (SR) 200 MG PO TB12: 200.0000 mg | ORAL_TABLET | Freq: Every day | ORAL | 0 refills | Status: DC

## 2018-12-04 NOTE — Progress Notes (Signed)
Office: [phone]  /  Fax: [phone]   HPI:   Chief Complaint: OBESITY Heather Boone is here to discuss her progress with her obesity treatment plan. She is keeping a food journal with 1200 to 1300 calories and 75+ grams of protein and is following her eating plan approximately 85 % of the time. She states she is walking 30 minutes 3 times per week. Heather Boone has done well maintaining weight over Thanksgiving. She worked on [employer] protein, vegetables and portion controlled the less ideal foods. She is traveling for Christmas and concerned about additional holiday weight gain.  Her weight is 214 lb (97.1 kg) today and has had a weight gain of 1 pounds over a period of 4 weeks since her last visit. She has lost 17 lbs since starting treatment with [place].  Insulin Resistance Rickell has a diagnosis of insulin resistance based on her elevated fasting insulin level >5. Although Jatoria's blood glucose readings are still under good control, insulin resistance puts her at greater risk of metabolic syndrome and diabetes. She is stable on metformin and her diet. She continues to work on diet and exercise to decrease risk of diabetes. She denies nausea, vomiting, and hypoglycemia.  At risk for diabetes Natoria is at higher than average risk for developing diabetes due to her insulin resistance and obesity. She currently denies polyuria or polydipsia.  Vitamin D deficiency Amiria has a diagnosis of vitamin D deficiency. She is currently taking vit D, but is not yet at goal. She denies nausea, vomiting, or muscle weakness.  Depression with emotional eating behaviors Elvin is struggling with emotional eating and using food for comfort to the extent that it is negatively impacting her health. She often snacks when she is not hungry. Brylan sometimes feels she is out of control and then feels guilty that she made poor food choices. She has been working on behavior modification techniques to  help reduce her emotional eating and has been somewhat successful. Her mood is stable on Wellbutrin and she denies insomnia.  ALLERGIES: Allergies  Allergen Reactions  . Aleve [Naproxen Sodium]   . Motrin [Ibuprofen]   . Oxycodone-Acetaminophen   . Tramadol     MEDICATIONS: Current Outpatient Medications on File Prior to Visit  Medication Sig Dispense Refill  . Prenatal Vit-Fe Fumarate-FA (PNV PRENATAL PLUS MULTIVITAMIN) 27-1 MG TABS Take 1 tablet by mouth daily. 90 tablet 4   No current facility-administered medications on file prior to visit.     PAST MEDICAL HISTORY: Past Medical History:  Diagnosis Date  . Back pain   . Constipation   . Heartburn   . Obesity     PAST SURGICAL HISTORY: Past Surgical History:  Procedure Laterality Date  . LAPAROSCOPIC GASTRIC BANDING     dec 2011    SOCIAL HISTORY: Social History   Tobacco Use  . Smoking status: Never Smoker  . Smokeless tobacco: Never Used  Substance Use Topics  . Alcohol use: No  . Drug use: No    FAMILY HISTORY: Family History  Problem Relation Age of Onset  . Hyperlipidemia Mother   . Hyperlipidemia Father   . Heart disease Father   . Cancer Father   . Obesity Father     ROS: Review of Systems  Constitutional: Negative for weight loss.  Gastrointestinal: Negative for nausea and vomiting.  Genitourinary:       Negative for polyuria.  Musculoskeletal:       Negative for muscle weakness.  Endo/Heme/Allergies: Negative for polydipsia.  Negative for hypoglycemia.  Psychiatric/Behavioral: Positive for depression. The patient does not have insomnia.     PHYSICAL EXAM: Blood pressure (!) 101/59, pulse 61, temperature 97.7 F (36.5 C), temperature source Oral, height 5\' 3"  (1.6 m), weight 214 lb (97.1 kg), last menstrual period 11/14/2018, SpO2 100 %. Body mass index is 37.91 kg/m. Physical Exam  Constitutional: She is oriented to person, place, and time. She appears well-developed and  well-nourished.  Cardiovascular: Normal rate.  Pulmonary/Chest: Effort normal.  Musculoskeletal: Normal range of motion.  Neurological: She is oriented to person, place, and time.  Skin: Skin is warm and dry.  Psychiatric: She has a normal mood and affect. Her behavior is normal.  Vitals reviewed.   RECENT LABS AND TESTS: BMET    Component Value Date/Time   NA 142 03/04/2018 0740   K 4.2 03/04/2018 0740   CL 107 (H) 03/04/2018 0740   CO2 20 03/04/2018 0740   GLUCOSE 85 03/04/2018 0740   GLUCOSE 93 01/13/2010 0220   BUN 13 03/04/2018 0740   CREATININE 0.91 03/04/2018 0740   CALCIUM 9.0 03/04/2018 0740   GFRNONAA 83 03/04/2018 0740   GFRAA 95 03/04/2018 0740   Lab Results  Component Value Date   HGBA1C 5.2 03/04/2018   HGBA1C 5.1 11/01/2017   HGBA1C 5.5 07/13/2017   Lab Results  Component Value Date   INSULIN 8.5 03/04/2018   INSULIN 14.2 11/01/2017   INSULIN 11.0 07/13/2017   CBC    Component Value Date/Time   WBC 6.8 03/04/2018 0740   WBC 12.2 (H) 01/13/2010 0205   RBC 4.44 03/04/2018 0740   RBC 5.05 01/13/2010 0205   HGB 11.7 03/04/2018 0740   HCT 37.1 03/04/2018 0740   PLT 383 01/13/2010 0205   MCV 84 03/04/2018 0740   MCH 26.4 (L) 03/04/2018 0740   MCHC 31.5 03/04/2018 0740   MCHC 33.9 01/13/2010 0205   RDW 14.6 03/04/2018 0740   LYMPHSABS 2.4 03/04/2018 0740   MONOABS 0.7 01/13/2010 0205   EOSABS 0.1 03/04/2018 0740   BASOSABS 0.0 03/04/2018 0740   Iron/TIBC/Ferritin/ %Sat No results found for: IRON, TIBC, FERRITIN, IRONPCTSAT Lipid Panel     Component Value Date/Time   CHOL 159 03/04/2018 0740   TRIG 44 03/04/2018 0740   HDL 72 03/04/2018 0740   LDLCALC 78 03/04/2018 0740   Hepatic Function Panel     Component Value Date/Time   PROT 6.7 03/04/2018 0740   ALBUMIN 3.8 03/04/2018 0740   AST 14 03/04/2018 0740   ALT 7 03/04/2018 0740   ALKPHOS 83 03/04/2018 0740   BILITOT 0.3 03/04/2018 0740   BILIDIR 0.2 01/13/2010 0205   IBILI 0.8  01/13/2010 0205      Component Value Date/Time   TSH 4.090 03/04/2018 0740   TSH 1.650 07/13/2017 1030   Results for DENNETTE, FAULCONER (MRN [id number]) as of 12/04/2018 11:37  Ref. Range 03/04/2018 07:40  Vitamin D, 25-Hydroxy Latest Ref Range: 30.0 - 100.0 ng/mL 37.4   ASSESSMENT AND PLAN: Insulin resistance - Plan: Hemoglobin A1c, Comprehensive metabolic panel, Insulin, random, Prealbumin, metFORMIN (GLUCOPHAGE) 500 MG tablet  Vitamin D deficiency - Plan: VITAMIN D 25 Hydroxy (Vit-D Deficiency, Fractures), Vitamin D, Ergocalciferol, (DRISDOL) 1.25 MG (50000 UT) CAPS capsule  Other depression - with emotional eating - Plan: buPROPion (WELLBUTRIN SR) 200 MG 12 hr tablet  At risk for diabetes mellitus  Class 2 severe obesity with serious comorbidity and body mass index (BMI) of 38.0 to 38.9 in adult, unspecified obesity  type [facility])  PLAN:  Insulin Resistance Nasya will continue to work on weight loss, exercise, and decreasing simple carbohydrates in her diet to help decrease the risk of diabetes.  She was informed that eating too many simple carbohydrates or too many calories at one sitting increases the likelihood of GI side effects. Alliya agreed to continue metformin 500mg  BID #60 with no refills and prescription was written today. Lise agreed to follow up with [place] as directed to monitor her progress in 3 to 4 weeks.  Diabetes risk counseling Milianna was given extended (15 minutes) diabetes prevention counseling today. She is 37 y.o. female and has risk factors for diabetes including insulin resistance and obesity. We discussed intensive lifestyle modifications today with an emphasis on weight loss as well as increasing exercise and decreasing simple carbohydrates in her diet.  Vitamin D Deficiency Layah was informed that low vitamin D levels contributes to fatigue and are associated with obesity, breast, and colon cancer. She agrees to continue to take prescription Vit D  @50 ,000 IU every week #4 with no refills and will follow up for routine testing of vitamin D, at least 2-3 times per year. She was informed of the risk of over-replacement of vitamin D and agrees to not increase her dose unless she discusses this with [place] first. Valaree agrees to follow up as directed.  Depression with Emotional Eating Behaviors We discussed behavior modification techniques today to help Ciela deal with her emotional eating and depression. She has agreed to take Wellbutrin SR 200mg  qd #30 with no refills and agreed to follow up as directed.  Obesity Camryn is currently in the action stage of change. As such, her goal is to continue with weight loss efforts. She has agreed to keep a food journal with 1200 to 1300 calories and 75+ grams of protein.  Marialuiza has been instructed to work up to a goal of 150 minutes of combined cardio and strengthening exercise per week for weight loss and overall health benefits. We discussed the following Behavioral Modification Strategies today: increasing lean protein intake, work on meal planning and easy cooking plans, holiday eating strategies, travel eating strategies, and celebration eating strategies.  Doralene has agreed to follow up with our clinic in 3 to 4 weeks. She was informed of the importance of frequent follow up visits to maximize her success with intensive lifestyle modifications for her multiple health conditions.   OBESITY BEHAVIORAL INTERVENTION VISIT  Today's visit was # 19   Starting weight: 231 lbs Starting date: 07/13/17 Today's weight : Weight: 214 lb (97.1 kg)  Today's date: 12/04/2018 Total lbs lost to date: 17  ASK: We discussed the diagnosis of obesity with Tacy Learn today and [name] agreed to give [place] permission to discuss obesity behavioral modification therapy today.  ASSESS: Saleena has the diagnosis of obesity and her BMI today is 37.9. Landri is in the action stage of change.    ADVISE: Shequilla was educated on the multiple health risks of obesity as well as the benefit of weight loss to improve her health. She was advised of the need for long term treatment and the importance of lifestyle modifications to improve her current health and to decrease her risk of future health problems.  AGREE: Multiple dietary modification options and treatment options were discussed and Paddy agreed to follow the recommendations documented in the above note.  ARRANGE: Maquita was educated on the importance of frequent visits to treat obesity as outlined per CMS and USPSTF guidelines  and agreed to schedule her next follow up appointment today.  I, Marcille Blanco, am acting as transcriptionist for Starlyn Skeans, MD  I have reviewed the above documentation for accuracy and completeness, and I agree with the above. -Dennard Nip, MD

## 2018-12-05 LAB — COMPREHENSIVE METABOLIC PANEL
ALT: 9 IU/L (ref 0–32)
AST: 17 IU/L (ref 0–40)
Albumin/Globulin Ratio: 1.3 (ref 1.2–2.2)
Albumin: 3.7 g/dL (ref 3.5–5.5)
Alkaline Phosphatase: 77 IU/L (ref 39–117)
BUN/Creatinine Ratio: 19 (ref 9–23)
BUN: 13 mg/dL (ref 6–20)
Bilirubin Total: 0.3 mg/dL (ref 0.0–1.2)
CO2: 21 mmol/L (ref 20–29)
Calcium: 9.2 mg/dL (ref 8.7–10.2)
Chloride: 104 mmol/L (ref 96–106)
Creatinine, Ser: 0.68 mg/dL (ref 0.57–1.00)
GFR calc Af Amer: 131 mL/min/{1.73_m2} (ref >59)
GFR calc non Af Amer: 114 mL/min/{1.73_m2} (ref >59)
Globulin, Total: 2.8 g/dL (ref 1.5–4.5)
Glucose: 83 mg/dL (ref 65–99)
Potassium: 4.2 mmol/L (ref 3.5–5.2)
Sodium: 139 mmol/L (ref 134–144)
Total Protein: 6.5 g/dL (ref 6.0–8.5)

## 2018-12-05 LAB — HEMOGLOBIN A1C
Est. average glucose Bld gHb Est-mCnc: 105 mg/dL
Hgb A1c MFr Bld: 5.3 % (ref 4.8–5.6)

## 2018-12-05 LAB — PREALBUMIN: PREALBUMIN: 22 mg/dL (ref 14–35)

## 2018-12-05 LAB — INSULIN, RANDOM: INSULIN: 14.4 u[IU]/mL (ref 2.6–24.9)

## 2018-12-05 LAB — VITAMIN D 25 HYDROXY (VIT D DEFICIENCY, FRACTURES): Vit D, 25-Hydroxy: 25 ng/mL — ABNORMAL LOW (ref 30.0–100.0)

## 2018-12-30 DIAGNOSIS — Z23 Encounter for immunization: Secondary | ICD-10-CM | POA: Diagnosis not present

## 2018-12-31 ENCOUNTER — Ambulatory Visit (INDEPENDENT_AMBULATORY_CARE_PROVIDER_SITE_OTHER): Payer: Managed Care, Other (non HMO) | Admitting: Family Medicine

## 2019-01-27 ENCOUNTER — Ambulatory Visit (INDEPENDENT_AMBULATORY_CARE_PROVIDER_SITE_OTHER): Payer: 59 | Admitting: Family Medicine

## 2019-01-27 ENCOUNTER — Encounter (INDEPENDENT_AMBULATORY_CARE_PROVIDER_SITE_OTHER): Payer: Self-pay | Admitting: Family Medicine

## 2019-01-27 VITALS — BP 112/70 | HR 75 | Temp 97.7°F | Ht 63.0 in | Wt 212.0 lb

## 2019-01-27 DIAGNOSIS — F3289 Other specified depressive episodes: Secondary | ICD-10-CM

## 2019-01-27 DIAGNOSIS — Z9189 Other specified personal risk factors, not elsewhere classified: Secondary | ICD-10-CM

## 2019-01-27 DIAGNOSIS — R7303 Prediabetes: Secondary | ICD-10-CM | POA: Diagnosis not present

## 2019-01-27 DIAGNOSIS — Z6837 Body mass index (BMI) 37.0-37.9, adult: Secondary | ICD-10-CM

## 2019-01-27 DIAGNOSIS — E559 Vitamin D deficiency, unspecified: Secondary | ICD-10-CM | POA: Diagnosis not present

## 2019-01-27 MED ORDER — VITAMIN D (ERGOCALCIFEROL) 1.25 MG (50000 UNIT) PO CAPS: 50000.0000 [IU] | ORAL_CAPSULE | ORAL | 0 refills | Status: DC

## 2019-01-27 MED ORDER — BUPROPION HCL ER (SR) 200 MG PO TB12: 200.0000 mg | ORAL_TABLET | Freq: Every day | ORAL | 0 refills | Status: DC

## 2019-01-27 MED ORDER — METFORMIN HCL 500 MG PO TABS: 500.0000 mg | ORAL_TABLET | Freq: Two times a day (BID) | ORAL | 0 refills | Status: DC

## 2019-01-27 NOTE — Progress Notes (Signed)
Office: [phone]  /  Fax: [phone]   HPI:   Chief Complaint: OBESITY Heather Boone is here to discuss her progress with her obesity treatment plan. She is keep a food journal with 1200 to 1300 calories and 75 grams of protein and is following her eating plan approximately 80 % of the time. She states she is walking 30 minutes 3 times per week. Heather Boone continues to do well with weight loss even over the holidays. She is working on increasing protein and request some easy recipes.  Her weight is 212 lb (96.2 kg) today and has had a weight loss of 2 pounds over a period of 8 weeks since her last visit. She has lost 19 lbs since starting treatment with [place].  Pre-Diabetes Heather Boone has a diagnosis of pre-diabetes based on her elevated Hgb A1c and was informed this puts her at greater risk of developing diabetes. She is taking metformin currently and continues to work on diet and exercise to decrease risk of diabetes. She denies nausea, vomiting, or hypoglycemia.  At risk for diabetes Heather Boone is at higher than average risk for developing diabetes due to her pre-diabetes and obesity. She currently denies polyuria or polydipsia.  Depression with emotional eating behaviors Heather Boone's mood is stable and she is doing well with meal prep. She is struggling with emotional eating and using food for comfort to the extent that it is negatively impacting her health. She often snacks when she is not hungry. Heather Boone sometimes feels she is out of control and then feels guilty that she made poor food choices. She has been working on behavior modification techniques to help reduce her emotional eating and has been somewhat successful.   Vitamin D deficiency Heather Boone has a diagnosis of vitamin D deficiency. She is currently stable on vit D, but is not yet at goal. She admits fatigue and denies nausea, vomiting, or muscle weakness.  ASSESSMENT AND PLAN:  Vitamin D deficiency - Plan: Vitamin D,  Ergocalciferol, (DRISDOL) 1.25 MG (50000 UT) CAPS capsule  Prediabetes - Plan: metFORMIN (GLUCOPHAGE) 500 MG tablet  Other depression - with emotional eating - Plan: buPROPion (WELLBUTRIN SR) 200 MG 12 hr tablet  At risk for diabetes mellitus  Class 2 severe obesity with serious comorbidity and body mass index (BMI) of 37.0 to 37.9 in adult, unspecified obesity type (Heather Boone)  PLAN:  Pre-Diabetes Heather Boone will continue to work on weight loss, exercise, and decreasing simple carbohydrates in her diet to help decrease the risk of diabetes.She was informed that eating too many simple carbohydrates or too many calories at one sitting increases the likelihood of GI side effects. Heather Boone agreed to continue metformin 500mg  BID #60 with no refills and a prescription was written today. Heather Boone agreed to follow up with [place] as directed to monitor her progress in 4 weeks.  Diabetes risk counseling Heather Boone was given extended (15 minutes) diabetes prevention counseling today. She is 38 y.o. female and has risk factors for diabetes including pre-diabetes and obesity. We discussed intensive lifestyle modifications today with an emphasis on weight loss as well as increasing exercise and decreasing simple carbohydrates in her diet.  Depression with Emotional Eating Behaviors We discussed behavior modification techniques today to help Heather Boone deal with her emotional eating and depression. She has agreed to take Wellbutrin SR 200mg  qd #30 with no refills and agreed to follow up as directed.  Vitamin D Deficiency Heather Boone was informed that low vitamin D levels contributes to fatigue and are associated with obesity, breast, and  colon cancer. She agrees to increase her prescription Vit D @50 ,000 IU to every 3 days #10 with no refills and will follow up for routine testing of vitamin D, at least 2-3 times per year. She was informed of the risk of over-replacement of vitamin D and agrees to not increase her dose unless  she discusses this with [place] first. Heather Boone agrees to follow up as directed.  Obesity Heather Boone is currently in the action stage of change. As such, her goal is to continue with weight loss efforts. She has agreed to keep a food journal with 1200 calories and 75 grams of protein.  Heather Boone has been instructed to work up to a goal of 150 minutes of combined cardio and strengthening exercise per week for weight loss and overall health benefits. We discussed the following Behavioral Modification Strategies today: increasing lean protein intake, decreasing simple carbohydrates, and work on meal planning and easy cooking plans.  Heather Boone has agreed to follow up with our clinic in 4 weeks. She was informed of the importance of frequent follow up visits to maximize her success with intensive lifestyle modifications for her multiple health conditions.  ALLERGIES: Allergies  Allergen Reactions  . Aleve [Naproxen Sodium]   . Motrin [Ibuprofen]   . Oxycodone-Acetaminophen   . Tramadol     MEDICATIONS: Current Outpatient Medications on File Prior to Visit  Medication Sig Dispense Refill  . Prenatal Vit-Fe Fumarate-FA (PNV PRENATAL PLUS MULTIVITAMIN) 27-1 MG TABS Take 1 tablet by mouth daily. 90 tablet 4   No current facility-administered medications on file prior to visit.     PAST MEDICAL HISTORY: Past Medical History:  Diagnosis Date  . Back pain   . Constipation   . Heartburn   . Obesity     PAST SURGICAL HISTORY: Past Surgical History:  Procedure Laterality Date  . LAPAROSCOPIC GASTRIC BANDING     dec 2011    SOCIAL HISTORY: Social History   Tobacco Use  . Smoking status: Never Smoker  . Smokeless tobacco: Never Used  Substance Use Topics  . Alcohol use: No  . Drug use: No    FAMILY HISTORY: Family History  Problem Relation Age of Onset  . Hyperlipidemia Mother   . Hyperlipidemia Father   . Heart disease Father   . Cancer Father   . Obesity Father      ROS: Review of Systems  Constitutional: Positive for malaise/fatigue and weight loss.  Gastrointestinal: Negative for nausea and vomiting.  Genitourinary:       Negative for polyuria.  Musculoskeletal:       Negative for muscle weakness.  Endo/Heme/Allergies: Negative for polydipsia.       Negative for hypoglycemia.  Psychiatric/Behavioral: Positive for depression.   PHYSICAL EXAM: Blood pressure 112/70, pulse 75, temperature 97.7 F (36.5 C), temperature source Oral, height 5\' 3"  (1.6 m), weight 212 lb (96.2 kg), last menstrual period 01/12/2019, SpO2 94 %. Body mass index is 37.55 kg/m. Physical Exam Vitals signs reviewed.  Constitutional:      Appearance: Normal appearance. She is obese.  Cardiovascular:     Rate and Rhythm: Normal rate.  Pulmonary:     Effort: Pulmonary effort is normal.  Musculoskeletal: Normal range of motion.  Skin:    General: Skin is warm and dry.  Neurological:     Mental Status: She is alert and oriented to person, place, and time.  Psychiatric:        Mood and Affect: Mood normal.  Behavior: Behavior normal.     RECENT LABS AND TESTS: BMET    Component Value Date/Time   NA 139 12/04/2018 0928   K 4.2 12/04/2018 0928   CL 104 12/04/2018 0928   CO2 21 12/04/2018 0928   GLUCOSE 83 12/04/2018 0928   GLUCOSE 93 01/13/2010 0220   BUN 13 12/04/2018 0928   CREATININE 0.68 12/04/2018 0928   CALCIUM 9.2 12/04/2018 0928   GFRNONAA 114 12/04/2018 0928   GFRAA 131 12/04/2018 0928   Lab Results  Component Value Date   HGBA1C 5.3 12/04/2018   HGBA1C 5.2 03/04/2018   HGBA1C 5.1 11/01/2017   HGBA1C 5.5 07/13/2017   Lab Results  Component Value Date   INSULIN 14.4 12/04/2018   INSULIN 8.5 03/04/2018   INSULIN 14.2 11/01/2017   INSULIN 11.0 07/13/2017   CBC    Component Value Date/Time   WBC 6.8 03/04/2018 0740   WBC 12.2 (H) 01/13/2010 0205   RBC 4.44 03/04/2018 0740   RBC 5.05 01/13/2010 0205   HGB 11.7 03/04/2018 0740    HCT 37.1 03/04/2018 0740   PLT 383 01/13/2010 0205   MCV 84 03/04/2018 0740   MCH 26.4 (L) 03/04/2018 0740   MCHC 31.5 03/04/2018 0740   MCHC 33.9 01/13/2010 0205   RDW 14.6 03/04/2018 0740   LYMPHSABS 2.4 03/04/2018 0740   MONOABS 0.7 01/13/2010 0205   EOSABS 0.1 03/04/2018 0740   BASOSABS 0.0 03/04/2018 0740   Iron/TIBC/Ferritin/ %Sat No results found for: IRON, TIBC, FERRITIN, IRONPCTSAT Lipid Panel     Component Value Date/Time   CHOL 159 03/04/2018 0740   TRIG 44 03/04/2018 0740   HDL 72 03/04/2018 0740   LDLCALC 78 03/04/2018 0740   Hepatic Function Panel     Component Value Date/Time   PROT 6.5 12/04/2018 0928   ALBUMIN 3.7 12/04/2018 0928   AST 17 12/04/2018 0928   ALT 9 12/04/2018 0928   ALKPHOS 77 12/04/2018 0928   BILITOT 0.3 12/04/2018 0928   BILIDIR 0.2 01/13/2010 0205   IBILI 0.8 01/13/2010 0205      Component Value Date/Time   TSH 4.090 03/04/2018 0740   TSH 1.650 07/13/2017 1030   Results for TAYELOR, OSBORNE (MRN [id number]) as of 01/27/2019 13:48  Ref. Range 12/04/2018 09:28  Vitamin D, 25-Hydroxy Latest Ref Range: 30.0 - 100.0 ng/mL 25.0 (L)    OBESITY BEHAVIORAL INTERVENTION VISIT  Today's visit was # 20   Starting weight: 231 lbs Starting date: 07/13/17 Today's weight : Weight: 212 lb (96.2 kg)  Today's date: 01/27/2019 Total lbs lost to date: 15  ASK: We discussed the diagnosis of obesity with Heather Boone today and Heather Boone agreed to give [place] permission to discuss obesity behavioral modification therapy today.  ASSESS: Concepcion has the diagnosis of obesity and her BMI today is 37.5. Lauran is in the action stage of change.   ADVISE: Sahira was educated on the multiple health risks of obesity as well as the benefit of weight loss to improve her health. She was advised of the need for long term treatment and the importance of lifestyle modifications to improve her current health and to decrease her risk of future health  problems.  AGREE: Multiple dietary modification options and treatment options were discussed and Tomeika agreed to follow the recommendations documented in the above note.  ARRANGE: Tayah was educated on the importance of frequent visits to treat obesity as outlined per CMS and USPSTF guidelines and agreed to schedule her next follow  up appointment today.  I, Marcille Blanco, am acting as transcriptionist for Starlyn Skeans, MD  I have reviewed the above documentation for accuracy and completeness, and I agree with the above. -Dennard Nip, MD

## 2019-02-24 ENCOUNTER — Ambulatory Visit (INDEPENDENT_AMBULATORY_CARE_PROVIDER_SITE_OTHER): Payer: 59 | Admitting: Family Medicine

## 2019-02-24 ENCOUNTER — Encounter (INDEPENDENT_AMBULATORY_CARE_PROVIDER_SITE_OTHER): Payer: Self-pay | Admitting: Family Medicine

## 2019-02-24 VITALS — BP 115/71 | HR 78 | Ht 63.0 in | Wt 214.0 lb

## 2019-02-24 DIAGNOSIS — Z9189 Other specified personal risk factors, not elsewhere classified: Secondary | ICD-10-CM

## 2019-02-24 DIAGNOSIS — R7303 Prediabetes: Secondary | ICD-10-CM | POA: Diagnosis not present

## 2019-02-24 DIAGNOSIS — E559 Vitamin D deficiency, unspecified: Secondary | ICD-10-CM

## 2019-02-24 DIAGNOSIS — Z6838 Body mass index (BMI) 38.0-38.9, adult: Secondary | ICD-10-CM

## 2019-02-24 DIAGNOSIS — F3289 Other specified depressive episodes: Secondary | ICD-10-CM

## 2019-02-24 MED ORDER — VITAMIN D (ERGOCALCIFEROL) 1.25 MG (50000 UNIT) PO CAPS: 50000.0000 [IU] | ORAL_CAPSULE | ORAL | 0 refills | Status: DC

## 2019-02-24 MED ORDER — METFORMIN HCL 500 MG PO TABS: 500.0000 mg | ORAL_TABLET | Freq: Two times a day (BID) | ORAL | 0 refills | Status: DC

## 2019-02-24 MED ORDER — BUPROPION HCL ER (SR) 200 MG PO TB12: 200.0000 mg | ORAL_TABLET | Freq: Every day | ORAL | 0 refills | Status: DC

## 2019-02-24 NOTE — Progress Notes (Signed)
Office: [phone]  /  Fax: [phone]   HPI:   Chief Complaint: OBESITY Heather Boone is here to discuss her progress with her obesity treatment plan. She is on the keep a food journal with 1200 calories and 75 grams of protein daily plan and is following her eating plan approximately 70 % of the time. She states she is walking 30 minutes 3 times per week. Heather Boone has been doing a lot of traveling for work in the last two weeks and she has had to eat out more. Heather Boone states this will be decreasing soon and she is ready to get back on track.  Her weight is 214 lb (97.1 kg) today and has had a weight gain of 2 pounds over a period of 4 weeks since her last visit. She has lost 17 lbs since starting treatment with [place].  Vitamin D deficiency Heather Boone has a diagnosis of vitamin D deficiency. She is stable on vit D. Her fatigue is improving and she  denies nausea, vomiting or muscle weakness.  At risk for osteopenia and osteoporosis Heather Boone is at higher risk of osteopenia and osteoporosis due to vitamin D deficiency.   Insulin Resistance Heather Boone has a diagnosis of insulin resistance based on her elevated fasting insulin level >5. Although Heather Boone's blood glucose readings are still under good control, insulin resistance puts her at greater risk of metabolic syndrome and diabetes. Heather Boone is doing well on Metformin and she continues to work on diet and exercise to decrease risk of diabetes. She has increased polyphagia when her simple carbohydrates increase and this has been worse in the last two weeks.  Depression with emotional eating behaviors Heather Boone is struggling with emotional eating and using food for comfort to the extent that it is negatively impacting her health. She often snacks when she is not hungry. Heather Boone sometimes feels she is out of control and then feels guilty that she made poor food choices. She has been working on behavior modification techniques to help reduce her  emotional eating and has been somewhat successful. She shows no sign of suicidal or homicidal ideations.  Depression screen PHQ 2/9 07/13/2017  Decreased Interest 3  Down, Depressed, Hopeless 1  PHQ - 2 Score 4  Altered sleeping 3  Tired, decreased energy 3  Change in appetite 2  Feeling bad or failure about yourself  0  Trouble concentrating 0  Moving slowly or fidgety/restless 0  Suicidal thoughts 0  PHQ-9 Score 12     ASSESSMENT AND PLAN:  Vitamin D deficiency - Plan: Vitamin D, Ergocalciferol, (DRISDOL) 1.25 MG (50000 UT) CAPS capsule  Prediabetes - Plan: metFORMIN (GLUCOPHAGE) 500 MG tablet  Other depression - with emotional eating - Plan: buPROPion (WELLBUTRIN SR) 200 MG 12 hr tablet  At risk for osteoporosis  Class 2 severe obesity with serious comorbidity and body mass index (BMI) of 38.0 to 38.9 in adult, unspecified obesity type ([place])  PLAN:  Vitamin D Deficiency Heather Boone was informed that low vitamin D levels contributes to fatigue and are associated with obesity, breast, and colon cancer. She agrees to continue to take prescription Vit D @50 ,000 IU every week #4 with no refills and will follow up for routine testing of vitamin D, at least 2-3 times per year. She was informed of the risk of over-replacement of vitamin D and agrees to not increase her dose unless she discusses this with [place] first. Buna agrees to follow up as directed.  At risk for osteopenia and osteoporosis Heather Boone was given  extended  (15 minutes) osteoporosis prevention counseling today. Heather Boone is at risk for osteopenia and osteoporosis due to her vitamin D deficiency. She was encouraged to take her vitamin D and follow her higher calcium diet and increase strengthening exercise to help strengthen her bones and decrease her risk of osteopenia and osteoporosis.  Insulin Resistance Heather Boone will get back to her diet prescription and she will continue to work on weight loss, exercise, and  decreasing simple carbohydrates in her diet to help decrease the risk of diabetes. We dicussed metformin including benefits and risks. She was informed that eating too many simple carbohydrates or too many calories at one sitting increases the likelihood of GI side effects. Heather Boone agreed to continue metformin 500 mg BID #60 with no refills and  follow up with [place] as directed to monitor her progress.  Depression with Emotional Eating Behaviors We discussed behavior modification techniques today to help Heather Boone deal with her emotional eating and depression. She has agreed to continue Wellbutrin SR 200 mg qd #30 with no refills and follow up as directed.  Obesity Heather Boone is currently in the action stage of change. As such, her goal is to continue with weight loss efforts She has agreed to keep a food journal with 1200 calories and 70+ grams of protein daily Heather Boone has been instructed to work up to a goal of 150 minutes of combined cardio and strengthening exercise per week for weight loss and overall health benefits. We discussed the following Behavioral Modification Strategies today: increasing lean protein intake and work on meal planning and easy cooking plans  Heather Boone has agreed to follow up with our clinic in 3 weeks. She was informed of the importance of frequent follow up visits to maximize her success with intensive lifestyle modifications for her multiple health conditions.  ALLERGIES: Allergies  Allergen Reactions  . Aleve [Naproxen Sodium]   . Motrin [Ibuprofen]   . Oxycodone-Acetaminophen   . Tramadol     MEDICATIONS: Current Outpatient Medications on File Prior to Visit  Medication Sig Dispense Refill  . Prenatal Vit-Fe Fumarate-FA (PNV PRENATAL PLUS MULTIVITAMIN) 27-1 MG TABS Take 1 tablet by mouth daily. 90 tablet 4   No current facility-administered medications on file prior to visit.     PAST MEDICAL HISTORY: Past Medical History:  Diagnosis Date  . Back pain     . Constipation   . Heartburn   . Obesity     PAST SURGICAL HISTORY: Past Surgical History:  Procedure Laterality Date  . LAPAROSCOPIC GASTRIC BANDING     dec 2011    SOCIAL HISTORY: Social History   Tobacco Use  . Smoking status: Never Smoker  . Smokeless tobacco: Never Used  Substance Use Topics  . Alcohol use: No  . Drug use: No    FAMILY HISTORY: Family History  Problem Relation Age of Onset  . Hyperlipidemia Mother   . Hyperlipidemia Father   . Heart disease Father   . Cancer Father   . Obesity Father     ROS: Review of Systems  Constitutional: Positive for malaise/fatigue. Negative for weight loss.  Gastrointestinal: Negative for nausea and vomiting.  Musculoskeletal:       Negative for muscle weakness  Endo/Heme/Allergies:       Positive for polyphagia  Psychiatric/Behavioral: Positive for depression. Negative for suicidal ideas.    PHYSICAL EXAM: Blood pressure 115/71, pulse 78, height 5\' 3"  (1.6 m), weight 214 lb (97.1 kg), last menstrual period 02/11/2019, SpO2 100 %. Body mass index  is 37.91 kg/m. Physical Exam Vitals signs reviewed.  Constitutional:      Appearance: Normal appearance. She is well-developed. She is obese.  Cardiovascular:     Rate and Rhythm: Normal rate.  Pulmonary:     Effort: Pulmonary effort is normal.  Musculoskeletal: Normal range of motion.  Skin:    General: Skin is warm and dry.  Neurological:     Mental Status: She is alert and oriented to person, place, and time.  Psychiatric:        Mood and Affect: Mood normal.        Behavior: Behavior normal.        Thought Content: Thought content does not include homicidal or suicidal ideation.     RECENT LABS AND TESTS: BMET    Component Value Date/Time   NA 139 12/04/2018 0928   K 4.2 12/04/2018 0928   CL 104 12/04/2018 0928   CO2 21 12/04/2018 0928   GLUCOSE 83 12/04/2018 0928   GLUCOSE 93 01/13/2010 0220   BUN 13 12/04/2018 0928   CREATININE 0.68  12/04/2018 0928   CALCIUM 9.2 12/04/2018 0928   GFRNONAA 114 12/04/2018 0928   GFRAA 131 12/04/2018 0928   Lab Results  Component Value Date   HGBA1C 5.3 12/04/2018   HGBA1C 5.2 03/04/2018   HGBA1C 5.1 11/01/2017   HGBA1C 5.5 07/13/2017   Lab Results  Component Value Date   INSULIN 14.4 12/04/2018   INSULIN 8.5 03/04/2018   INSULIN 14.2 11/01/2017   INSULIN 11.0 07/13/2017   CBC    Component Value Date/Time   WBC 6.8 03/04/2018 0740   WBC 12.2 (H) 01/13/2010 0205   RBC 4.44 03/04/2018 0740   RBC 5.05 01/13/2010 0205   HGB 11.7 03/04/2018 0740   HCT 37.1 03/04/2018 0740   PLT 383 01/13/2010 0205   MCV 84 03/04/2018 0740   MCH 26.4 (L) 03/04/2018 0740   MCHC 31.5 03/04/2018 0740   MCHC 33.9 01/13/2010 0205   RDW 14.6 03/04/2018 0740   LYMPHSABS 2.4 03/04/2018 0740   MONOABS 0.7 01/13/2010 0205   EOSABS 0.1 03/04/2018 0740   BASOSABS 0.0 03/04/2018 0740   Iron/TIBC/Ferritin/ %Sat No results found for: IRON, TIBC, FERRITIN, IRONPCTSAT Lipid Panel     Component Value Date/Time   CHOL 159 03/04/2018 0740   TRIG 44 03/04/2018 0740   HDL 72 03/04/2018 0740   LDLCALC 78 03/04/2018 0740   Hepatic Function Panel     Component Value Date/Time   PROT 6.5 12/04/2018 0928   ALBUMIN 3.7 12/04/2018 0928   AST 17 12/04/2018 0928   ALT 9 12/04/2018 0928   ALKPHOS 77 12/04/2018 0928   BILITOT 0.3 12/04/2018 0928   BILIDIR 0.2 01/13/2010 0205   IBILI 0.8 01/13/2010 0205      Component Value Date/Time   TSH 4.090 03/04/2018 0740   TSH 1.650 07/13/2017 1030     Ref. Range 12/04/2018 09:28  Vitamin D, 25-Hydroxy Latest Ref Range: 30.0 - 100.0 ng/mL 25.0 (L)     OBESITY BEHAVIORAL INTERVENTION VISIT  Today's visit was # 21   Starting weight: 231 lbs Starting date: 07/13/2017 Today's weight : 214 lbs Today's date: 02/24/2019 Total lbs lost to date: 17    02/24/2019  Height 5\' 3"  (1.6 m)  Weight 214 lb (97.1 kg)  BMI (Calculated) 37.92  BLOOD PRESSURE - SYSTOLIC  505  BLOOD PRESSURE - DIASTOLIC 71   Body Fat % 39.7 %  Total Body Water (lbs) 82 lbs  ASK: We discussed the diagnosis of obesity with Heather Boone today and Heather Boone agreed to give [place] permission to discuss obesity behavioral modification therapy today.  ASSESS: Kriya has the diagnosis of obesity and her BMI today is 37.92 Heather Boone is in the action stage of change   ADVISE: Heather Boone was educated on the multiple health risks of obesity as well as the benefit of weight loss to improve her health. She was advised of the need for long term treatment and the importance of lifestyle modifications to improve her current health and to decrease her risk of future health problems.  AGREE: Multiple dietary modification options and treatment options were discussed and  Heather Boone agreed to follow the recommendations documented in the above note.  ARRANGE: Heather Boone was educated on the importance of frequent visits to treat obesity as outlined per CMS and USPSTF guidelines and agreed to schedule her next follow up appointment today.  I, Doreene Nest, am acting as transcriptionist for Dennard Nip, MD  I have reviewed the above documentation for accuracy and completeness, and I agree with the above. -Dennard Nip, MD

## 2019-03-17 ENCOUNTER — Encounter (INDEPENDENT_AMBULATORY_CARE_PROVIDER_SITE_OTHER): Payer: Self-pay | Admitting: Family Medicine

## 2019-03-17 ENCOUNTER — Other Ambulatory Visit: Payer: Self-pay

## 2019-03-17 ENCOUNTER — Ambulatory Visit (INDEPENDENT_AMBULATORY_CARE_PROVIDER_SITE_OTHER): Payer: 59 | Admitting: Family Medicine

## 2019-03-17 VITALS — BP 111/74 | HR 72 | Ht 63.0 in | Wt 214.0 lb

## 2019-03-17 DIAGNOSIS — E8881 Metabolic syndrome: Secondary | ICD-10-CM

## 2019-03-17 DIAGNOSIS — Z9189 Other specified personal risk factors, not elsewhere classified: Secondary | ICD-10-CM | POA: Diagnosis not present

## 2019-03-17 DIAGNOSIS — F3289 Other specified depressive episodes: Secondary | ICD-10-CM | POA: Diagnosis not present

## 2019-03-17 DIAGNOSIS — Z6838 Body mass index (BMI) 38.0-38.9, adult: Secondary | ICD-10-CM

## 2019-03-17 DIAGNOSIS — E559 Vitamin D deficiency, unspecified: Secondary | ICD-10-CM

## 2019-03-17 DIAGNOSIS — R7303 Prediabetes: Secondary | ICD-10-CM

## 2019-03-17 MED ORDER — VITAMIN D (ERGOCALCIFEROL) 1.25 MG (50000 UNIT) PO CAPS: 50000.0000 [IU] | ORAL_CAPSULE | ORAL | 0 refills | Status: DC

## 2019-03-17 MED ORDER — BUPROPION HCL ER (SR) 150 MG PO TB12: 150.0000 mg | ORAL_TABLET | Freq: Two times a day (BID) | ORAL | 0 refills | Status: DC

## 2019-03-17 MED ORDER — BUPROPION HCL ER (SR) 150 MG PO TB12: 150.0000 mg | ORAL_TABLET | Freq: Every day | ORAL | 0 refills | Status: DC

## 2019-03-17 MED ORDER — METFORMIN HCL 500 MG PO TABS: 500.0000 mg | ORAL_TABLET | Freq: Two times a day (BID) | ORAL | 0 refills | Status: DC

## 2019-03-17 NOTE — Progress Notes (Signed)
Office: [phone]  /  Fax: [phone]   HPI:   Chief Complaint: OBESITY Heather Boone is here to discuss her progress with her obesity treatment plan. She is keeping a food journal with 1200 calories and 70+ grams of protein and is following her eating plan approximately 70 % of the time. She states she is walking 60 minutes 2 times per week. Heather Boone has done very well maintaining her weight. She is not journaling often, as she is mostly using portion control and smarter choices. She notes increased stress eating and snacking.  Her weight is 214 lb (97.1 kg) today and has not lost weight since her last visit. She has lost 17 lbs since starting treatment with [place].  Depression with emotional eating behaviors Heather Boone's mood is stable, but she notes increased stress and emotional eating in the last week or so. She is struggling with emotional eating and using food for comfort to the extent that it is negatively impacting her health. She often snacks when she is not hungry. Heather Boone sometimes feels she is out of control and then feels guilty that she made poor food choices. She has been working on behavior modification techniques to help reduce her emotional eating and has been somewhat successful. She shows no sign of suicidal or homicidal ideations.  Insulin Resistance Heather Boone has a diagnosis of insulin resistance based on her elevated fasting insulin level >5. Although Heather Boone's blood glucose readings are still under good control, insulin resistance puts her at greater risk of metabolic syndrome and diabetes. She is stable on metformin currently and continues to work on diet and exercise to decrease risk of diabetes. Heather Boone denies nausea, vomiting, or hypoglycemia.  Vitamin D Deficiency Heather Boone has a diagnosis of vitamin D deficiency. She is currently stable on vit D. Heather Boone denies nausea, vomiting, or muscle weakness.  At risk for cardiovascular disease Heather Boone is at a higher than  average risk for cardiovascular disease due to insulin resistance, vitamin D deficiency, and obesity.   ASSESSMENT AND PLAN:  Insulin resistance  Vitamin D deficiency - Plan: Vitamin D, Ergocalciferol, (DRISDOL) 1.25 MG (50000 UT) CAPS capsule  Other depression - with emotional eating - Plan: buPROPion (WELLBUTRIN SR) 150 MG 12 hr tablet  At risk for heart disease  Class 2 severe obesity with serious comorbidity and body mass index (BMI) of 38.0 to 38.9 in adult, unspecified obesity type ([name])  Prediabetes - Plan: metFORMIN (GLUCOPHAGE) 500 MG tablet  PLAN:  Insulin Resistance Heather Boone will continue to work on weight loss, exercise, and decreasing simple carbohydrates in her diet to help decrease the risk of diabetes. She was informed that eating too many simple carbohydrates or too many calories at one sitting increases the likelihood of GI side effects. Heather Boone agreed to continue metformin 500 mg BID #60 with no refills and prescription was written today. Heather Boone agreed to follow up with [place] as directed to monitor her progress in 2 to 3 weeks.  Vitamin D Deficiency Heather Boone was informed that low vitamin D levels contribute to fatigue and are associated with obesity, breast, and colon cancer. Heather Boone agrees to continue to take prescription Vit D @50 ,000 IU every week #4 with no refills and will follow up for routine testing of vitamin D, at least 2-3 times per year. She was informed of the risk of over-replacement of vitamin D and agrees to not increase her dose unless she discusses this with [place] first. Heather Boone agrees to follow up in 4 weeks as directed.  Cardiovascular risk  counseling Heather Boone was given extended (15 minutes) coronary artery disease prevention counseling today. She is 38 y.o. female and has risk factors for heart disease including insulin resistance, vitamin D deficiency, and obesity. We discussed intensive lifestyle modifications today with an emphasis on specific weight  loss instructions and strategies. Pt was also informed of the importance of increasing exercise and decreasing saturated fats to help prevent heart disease.  Depression with Emotional Eating Behaviors We discussed behavior modification techniques today to help Heather Boone deal with her emotional eating and depression. She has agreed to change Wellbutrin SR 150 mg BID #60 with no refills and agreed to follow up as directed.  Obesity Heather Boone is currently in the action stage of change. As such, her goal is to continue with weight loss efforts. She has agreed to keep a food journal with 1200 calories and 75+ grams of protein.  Heather Boone has been instructed to work up to a goal of 150 minutes of combined cardio and strengthening exercise per week for weight loss and overall health benefits. We discussed the following Behavioral Modification Strategies today: increasing lean protein intake, decreasing simple carbohydrates, work on meal planning and easy cooking plans, emotional eating strategies, ways to avoid boredom eating, better snacking choices, keeping healthy foods in the home, and ways to avoid night time snacking  Heather Boone has agreed to follow up with our clinic in 2 to 3 weeks. She was informed of the importance of frequent follow up visits to maximize her success with intensive lifestyle modifications for her multiple health conditions.  ALLERGIES: Allergies  Allergen Reactions  . Aleve [Naproxen Sodium]   . Motrin [Ibuprofen]   . Oxycodone-Acetaminophen   . Tramadol     MEDICATIONS: Current Outpatient Medications on File Prior to Visit  Medication Sig Dispense Refill  . Prenatal Vit-Fe Fumarate-FA (PNV PRENATAL PLUS MULTIVITAMIN) 27-1 MG TABS Take 1 tablet by mouth daily. 90 tablet 4   No current facility-administered medications on file prior to visit.     PAST MEDICAL HISTORY: Past Medical History:  Diagnosis Date  . Back pain   . Constipation   . Heartburn   . Obesity      PAST SURGICAL HISTORY: Past Surgical History:  Procedure Laterality Date  . LAPAROSCOPIC GASTRIC BANDING     dec 2011    SOCIAL HISTORY: Social History   Tobacco Use  . Smoking status: Never Smoker  . Smokeless tobacco: Never Used  Substance Use Topics  . Alcohol use: No  . Drug use: No    FAMILY HISTORY: Family History  Problem Relation Age of Onset  . Hyperlipidemia Mother   . Hyperlipidemia Father   . Heart disease Father   . Cancer Father   . Obesity Father    ROS: Review of Systems  Constitutional: Negative for weight loss.  Gastrointestinal: Negative for nausea and vomiting.  Musculoskeletal:       Negative for muscle weakness.  Endo/Heme/Allergies:       Negative for hypoglycemia.  Psychiatric/Behavioral: Positive for depression.   PHYSICAL EXAM: Blood pressure 111/74, pulse 72, height 5\' 3"  (1.6 m), weight 214 lb (97.1 kg), last menstrual period 03/12/2019, SpO2 100 %. Body mass index is 37.91 kg/m. Physical Exam Vitals signs reviewed.  Constitutional:      Appearance: Normal appearance. She is obese.  Cardiovascular:     Rate and Rhythm: Normal rate.  Pulmonary:     Effort: Pulmonary effort is normal.  Musculoskeletal: Normal range of motion.  Skin:  General: Skin is warm and dry.  Neurological:     Mental Status: She is alert and oriented to person, place, and time.  Psychiatric:        Mood and Affect: Mood normal.        Behavior: Behavior normal.    RECENT LABS AND TESTS: BMET    Component Value Date/Time   NA 139 12/04/2018 0928   K 4.2 12/04/2018 0928   CL 104 12/04/2018 0928   CO2 21 12/04/2018 0928   GLUCOSE 83 12/04/2018 0928   GLUCOSE 93 01/13/2010 0220   BUN 13 12/04/2018 0928   CREATININE 0.68 12/04/2018 0928   CALCIUM 9.2 12/04/2018 0928   GFRNONAA 114 12/04/2018 0928   GFRAA 131 12/04/2018 0928   Lab Results  Component Value Date   HGBA1C 5.3 12/04/2018   HGBA1C 5.2 03/04/2018   HGBA1C 5.1 11/01/2017   HGBA1C  5.5 07/13/2017   Lab Results  Component Value Date   INSULIN 14.4 12/04/2018   INSULIN 8.5 03/04/2018   INSULIN 14.2 11/01/2017   INSULIN 11.0 07/13/2017   CBC    Component Value Date/Time   WBC 6.8 03/04/2018 0740   WBC 12.2 (H) 01/13/2010 0205   RBC 4.44 03/04/2018 0740   RBC 5.05 01/13/2010 0205   HGB 11.7 03/04/2018 0740   HCT 37.1 03/04/2018 0740   PLT 383 01/13/2010 0205   MCV 84 03/04/2018 0740   MCH 26.4 (L) 03/04/2018 0740   MCHC 31.5 03/04/2018 0740   MCHC 33.9 01/13/2010 0205   RDW 14.6 03/04/2018 0740   LYMPHSABS 2.4 03/04/2018 0740   MONOABS 0.7 01/13/2010 0205   EOSABS 0.1 03/04/2018 0740   BASOSABS 0.0 03/04/2018 0740   Iron/TIBC/Ferritin/ %Sat No results found for: IRON, TIBC, FERRITIN, IRONPCTSAT Lipid Panel     Component Value Date/Time   CHOL 159 03/04/2018 0740   TRIG 44 03/04/2018 0740   HDL 72 03/04/2018 0740   LDLCALC 78 03/04/2018 0740   Hepatic Function Panel     Component Value Date/Time   PROT 6.5 12/04/2018 0928   ALBUMIN 3.7 12/04/2018 0928   AST 17 12/04/2018 0928   ALT 9 12/04/2018 0928   ALKPHOS 77 12/04/2018 0928   BILITOT 0.3 12/04/2018 0928   BILIDIR 0.2 01/13/2010 0205   IBILI 0.8 01/13/2010 0205      Component Value Date/Time   TSH 4.090 03/04/2018 0740   TSH 1.650 07/13/2017 1030   Results for AARINI, SLEE (MRN [id number]) as of 03/17/2019 10:17  Ref. Range 12/04/2018 09:28  Vitamin D, 25-Hydroxy Latest Ref Range: 30.0 - 100.0 ng/mL 25.0 (L)   OBESITY BEHAVIORAL INTERVENTION VISIT  Today's visit was # 22   Starting weight: 231 lbs Starting date: 07/13/17 Today's weight : Weight: 214 lb (97.1 kg)  Today's date: 03/17/2019 Total lbs lost to date: 17    03/17/2019  Height 5\' 3"  (1.6 m)  Weight 214 lb (97.1 kg)  BMI (Calculated) 37.92  BLOOD PRESSURE - SYSTOLIC 166  BLOOD PRESSURE - DIASTOLIC 74   Body Fat % 06.3 %  Total Body Water (lbs) 78.8 lbs   ASK: We discussed the diagnosis of obesity with  Tacy Learn today and Bhakti agreed to give [place] permission to discuss obesity behavioral modification therapy today.  ASSESS: Eithel has the diagnosis of obesity and her BMI today is 37.92. Arsema is in the action stage of change.   ADVISE: Ajah was educated on the multiple health risks of obesity as well as  the benefit of weight loss to improve her health. She was advised of the need for long term treatment and the importance of lifestyle modifications to improve her current health and to decrease her risk of future health problems.  AGREE: Multiple dietary modification options and treatment options were discussed and Mekisha agreed to follow the recommendations documented in the above note.  ARRANGE: Katy was educated on the importance of frequent visits to treat obesity as outlined per CMS and USPSTF guidelines and agreed to schedule her next follow up appointment today.  IMarcille Blanco, CMA, am acting as transcriptionist for Starlyn Skeans, MD  I have reviewed the above documentation for accuracy and completeness, and I agree with the above. -Dennard Nip, MD

## 2019-03-18 ENCOUNTER — Encounter (INDEPENDENT_AMBULATORY_CARE_PROVIDER_SITE_OTHER): Payer: Self-pay

## 2019-03-31 ENCOUNTER — Encounter (INDEPENDENT_AMBULATORY_CARE_PROVIDER_SITE_OTHER): Payer: Self-pay | Admitting: Family Medicine

## 2019-03-31 ENCOUNTER — Other Ambulatory Visit: Payer: Self-pay

## 2019-03-31 ENCOUNTER — Ambulatory Visit (INDEPENDENT_AMBULATORY_CARE_PROVIDER_SITE_OTHER): Payer: 59 | Admitting: Family Medicine

## 2019-03-31 DIAGNOSIS — Z6837 Body mass index (BMI) 37.0-37.9, adult: Secondary | ICD-10-CM

## 2019-03-31 DIAGNOSIS — E559 Vitamin D deficiency, unspecified: Secondary | ICD-10-CM

## 2019-03-31 DIAGNOSIS — E8881 Metabolic syndrome: Secondary | ICD-10-CM | POA: Diagnosis not present

## 2019-03-31 DIAGNOSIS — F3289 Other specified depressive episodes: Secondary | ICD-10-CM | POA: Diagnosis not present

## 2019-03-31 MED ORDER — BUPROPION HCL ER (SR) 150 MG PO TB12: 150.0000 mg | ORAL_TABLET | Freq: Two times a day (BID) | ORAL | 0 refills | Status: DC

## 2019-03-31 MED ORDER — VITAMIN D (ERGOCALCIFEROL) 1.25 MG (50000 UNIT) PO CAPS: 50000.0000 [IU] | ORAL_CAPSULE | ORAL | 0 refills | Status: DC

## 2019-03-31 MED ORDER — METFORMIN HCL 500 MG PO TABS: 500.0000 mg | ORAL_TABLET | Freq: Three times a day (TID) | ORAL | 0 refills | Status: DC

## 2019-03-31 NOTE — Progress Notes (Signed)
Office: [phone]  /  Fax: [phone] TeleHealth Visit:  Heather Boone has verbally consented to this TeleHealth visit today. The patient is located at home, the provider is located at the [facility] office. The participants in this visit include the listed provider and patient. The visit was conducted today via Face Time.  HPI:   Chief Complaint: OBESITY Heather Boone is here to discuss her progress with her obesity treatment plan. She is keeping  a food journal with 1200 calories and 75 grams of protein and is following her eating plan approximately 80 % of the time. She states she is walking 30 minutes 5 times per week. Mayli feels like she has gained weight, but her scale says that she has maintained. She notes increased afternoon snacking due to stress and increased hunger.  We were unable to weigh the patient today for this TeleHealth visit. She feels as if she has gained weight since her last visit. She has lost 17 lbs since starting treatment with [place].  Insulin Resistance Heather Boone has a diagnosis of insulin resistance based on her elevated fasting insulin level >5. Although Heather Boone's blood glucose readings are still under good control, insulin resistance puts her at greater risk of metabolic syndrome and diabetes. Heather Boone's A1c was within normal limits, but she is still struggling to follow her plan and she has increased simpler carbs, especially in the afternoon. She is taking metformin currently and continues to work on diet and exercise to decrease risk of diabetes.  Depression with emotional eating behaviors Heather Boone increased her Wellbutrin and feels her mood has improved, but she is still stress eating, which is worse in the afternoon. She is struggling with using food for comfort to the extent that it is negatively impacting her health. She often snacks when she is not hungry. Heather Boone sometimes feels she is out of control and then feels guilty that she made  poor food choices. She has been working on behavior modification techniques to help reduce her emotional eating and has been somewhat successful.   Vitamin D Deficiency Heather Boone has a diagnosis of vitamin D deficiency. She is currently stable on vit D, but is not yet at goal. Heather Boone denies nausea, vomiting, or muscle weakness.  ASSESSMENT AND PLAN:  Prediabetes - Plan: metFORMIN (GLUCOPHAGE) 500 MG tablet  Vitamin D deficiency - Plan: Vitamin D, Ergocalciferol, (DRISDOL) 1.25 MG (50000 UT) CAPS capsule  Other depression - with emotional eating - Plan: buPROPion (WELLBUTRIN SR) 150 MG 12 hr tablet  Class 2 severe obesity with serious comorbidity and body mass index (BMI) of 37.0 to 37.9 in adult, unspecified obesity type ([place])  PLAN:  Insulin Resistance Heather Boone will continue to work on weight loss, exercise, and decreasing simple carbohydrates in her diet to help decrease the risk of diabetes. She was informed that eating too many simple carbohydrates or too many calories at one sitting increases the likelihood of GI side effects. Heather Boone agreed to increase her metformin 500 mg TID #90 with no refills and prescription was written today. Jaslin agreed to follow up with [place] as directed to monitor her progress.   Depression with Emotional Eating Behaviors We discussed cognitive behavior modification techniques to help Vivica decrease her emotional eating and depression. She has agreed to take Wellbutrin SR 150 mg BID #60 and agreed to follow up as directed.  Vitamin D Deficiency Kade was informed that low vitamin D levels contribute to fatigue and are associated with obesity, breast, and colon cancer. Heather Boone  agrees to continue to take prescription Vit D @50 ,000 IU every week #4 with no refills and will follow up for routine testing of vitamin D, at least 2-3 times per year. She was informed of the risk of over-replacement of vitamin D and agrees to not increase her dose unless she  discusses this with [place] first. Heather Boone agrees to follow up in 3 weeks as directed.  Obesity Heather Boone is currently in the action stage of change. As such, her goal is to continue with weight loss efforts. She has agreed to keep a food journal with 1200 calories and 75 grams of protein.  Heather Boone has been instructed to work up to a goal of 150 minutes of combined cardio and strengthening exercise per week for weight loss and overall health benefits. We discussed the following Behavioral Modification Strategies today: work on meal planning and easy cooking plans, emotional eating strategies, ways to avoid boredom eating, better snacking choices, and ways to avoid night time snacking.  Heather Boone has agreed to follow up with our clinic in 3 weeks. She was informed of the importance of frequent follow up visits to maximize her success with intensive lifestyle modifications for her multiple health conditions.  ALLERGIES: Allergies  Allergen Reactions  . Aleve [Naproxen Sodium]   . Motrin [Ibuprofen]   . Oxycodone-Acetaminophen   . Tramadol     MEDICATIONS: Current Outpatient Medications on File Prior to Visit  Medication Sig Dispense Refill  . Prenatal Vit-Fe Fumarate-FA (PNV PRENATAL PLUS MULTIVITAMIN) 27-1 MG TABS Take 1 tablet by mouth daily. 90 tablet 4   No current facility-administered medications on file prior to visit.     PAST MEDICAL HISTORY: Past Medical History:  Diagnosis Date  . Back pain   . Constipation   . Heartburn   . Obesity     PAST SURGICAL HISTORY: Past Surgical History:  Procedure Laterality Date  . LAPAROSCOPIC GASTRIC BANDING     dec 2011    SOCIAL HISTORY: Social History   Tobacco Use  . Smoking status: Never Smoker  . Smokeless tobacco: Never Used  Substance Use Topics  . Alcohol use: No  . Drug use: No    FAMILY HISTORY: Family History  Problem Relation Age of Onset  . Hyperlipidemia Mother   . Hyperlipidemia Father   . Heart disease  Father   . Cancer Father   . Obesity Father     ROS: Review of Systems  Gastrointestinal: Negative for nausea and vomiting.  Musculoskeletal:       Negative for muscle weakness.    PHYSICAL EXAM: Pt in no acute distress  RECENT LABS AND TESTS: BMET    Component Value Date/Time   NA 139 12/04/2018 0928   K 4.2 12/04/2018 0928   CL 104 12/04/2018 0928   CO2 21 12/04/2018 0928   GLUCOSE 83 12/04/2018 0928   GLUCOSE 93 01/13/2010 0220   BUN 13 12/04/2018 0928   CREATININE 0.68 12/04/2018 0928   CALCIUM 9.2 12/04/2018 0928   GFRNONAA 114 12/04/2018 0928   GFRAA 131 12/04/2018 0928   Lab Results  Component Value Date   HGBA1C 5.3 12/04/2018   HGBA1C 5.2 03/04/2018   HGBA1C 5.1 11/01/2017   HGBA1C 5.5 07/13/2017   Lab Results  Component Value Date   INSULIN 14.4 12/04/2018   INSULIN 8.5 03/04/2018   INSULIN 14.2 11/01/2017   INSULIN 11.0 07/13/2017   CBC    Component Value Date/Time   WBC 6.8 03/04/2018 0740   WBC 12.2 (  H) 01/13/2010 0205   RBC 4.44 03/04/2018 0740   RBC 5.05 01/13/2010 0205   HGB 11.7 03/04/2018 0740   HCT 37.1 03/04/2018 0740   PLT 383 01/13/2010 0205   MCV 84 03/04/2018 0740   MCH 26.4 (L) 03/04/2018 0740   MCHC 31.5 03/04/2018 0740   MCHC 33.9 01/13/2010 0205   RDW 14.6 03/04/2018 0740   LYMPHSABS 2.4 03/04/2018 0740   MONOABS 0.7 01/13/2010 0205   EOSABS 0.1 03/04/2018 0740   BASOSABS 0.0 03/04/2018 0740   Iron/TIBC/Ferritin/ %Sat No results found for: IRON, TIBC, FERRITIN, IRONPCTSAT Lipid Panel     Component Value Date/Time   CHOL 159 03/04/2018 0740   TRIG 44 03/04/2018 0740   HDL 72 03/04/2018 0740   LDLCALC 78 03/04/2018 0740   Hepatic Function Panel     Component Value Date/Time   PROT 6.5 12/04/2018 0928   ALBUMIN 3.7 12/04/2018 0928   AST 17 12/04/2018 0928   ALT 9 12/04/2018 0928   ALKPHOS 77 12/04/2018 0928   BILITOT 0.3 12/04/2018 0928   BILIDIR 0.2 01/13/2010 0205   IBILI 0.8 01/13/2010 0205       Component Value Date/Time   TSH 4.090 03/04/2018 0740   TSH 1.650 07/13/2017 1030    Results for [name], BEYNON (MRN [id number]) as of 03/31/2019 15:21  Ref. Range 12/04/2018 09:28  Vitamin D, 25-Hydroxy Latest Ref Range: 30.0 - 100.0 ng/mL 25.0 (L)    I, Marcille Blanco, CMA, am acting as transcriptionist for Starlyn Skeans, MD I have reviewed the above documentation for accuracy and completeness, and I agree with the above. -Dennard Nip, MD

## 2019-04-21 ENCOUNTER — Ambulatory Visit (INDEPENDENT_AMBULATORY_CARE_PROVIDER_SITE_OTHER): Payer: 59 | Admitting: Family Medicine

## 2019-04-21 ENCOUNTER — Encounter (INDEPENDENT_AMBULATORY_CARE_PROVIDER_SITE_OTHER): Payer: Self-pay | Admitting: Family Medicine

## 2019-04-21 ENCOUNTER — Other Ambulatory Visit: Payer: Self-pay

## 2019-04-21 DIAGNOSIS — Z6837 Body mass index (BMI) 37.0-37.9, adult: Secondary | ICD-10-CM

## 2019-04-21 DIAGNOSIS — E559 Vitamin D deficiency, unspecified: Secondary | ICD-10-CM

## 2019-04-21 DIAGNOSIS — E8881 Metabolic syndrome: Secondary | ICD-10-CM | POA: Diagnosis not present

## 2019-04-21 DIAGNOSIS — F3289 Other specified depressive episodes: Secondary | ICD-10-CM | POA: Diagnosis not present

## 2019-04-21 MED ORDER — BUPROPION HCL ER (SR) 150 MG PO TB12: 150.0000 mg | ORAL_TABLET | Freq: Two times a day (BID) | ORAL | 0 refills | Status: DC

## 2019-04-21 MED ORDER — VITAMIN D (ERGOCALCIFEROL) 1.25 MG (50000 UNIT) PO CAPS: 50000.0000 [IU] | ORAL_CAPSULE | ORAL | 0 refills | Status: DC

## 2019-04-21 MED ORDER — METFORMIN HCL 500 MG PO TABS: 500.0000 mg | ORAL_TABLET | Freq: Three times a day (TID) | ORAL | 0 refills | Status: DC

## 2019-04-21 NOTE — Progress Notes (Signed)
Office: [phone]  /  Fax: [phone] TeleHealth Visit:  Heather Boone has verbally consented to this TeleHealth visit today. The patient is located at home, the provider is located at the [facility] office. The participants in this visit include the listed provider and patient. The visit was conducted today via Face Time.  HPI:   Chief Complaint: OBESITY Heather Boone is here to discuss her progress with her obesity treatment plan. She is keeping a food journal with 1200 calories and 75 grams of protein and is following her eating plan approximately 75 % of the time. She states she is walking 30 minutes 2 times per week. Heather Boone thinks she has maintained her weight since her last visit. She is trying to be mindful of her food choices and limiting her snacking. She has started walking over her lunch break on nice days to help make up for how sedentary she is now while working from home.  We were unable to weigh the patient today for this TeleHealth visit. She feels as if she has gained weight since her last visit. She has lost 17 lbs since starting treatment with [place].  Vitamin D Deficiency Heather Boone has a diagnosis of vitamin D deficiency. She is currently on vit D, but is not improving. Heather Boone admits fatigue and denies nausea, vomiting, or muscle weakness.  Depression with emotional eating behaviors Heather Boone's mood is stable on Wellbutrin and she feels that she is able to control her emotional eating better and using food for comfort to the extent that it is negatively impacting her health. She has been working on behavior modification techniques to help reduce her emotional eating and has been somewhat successful. Heather Boone denies insomnia or palpitations. She shows no sign of suicidal or homicidal ideations.   Insulin Resistance Heather Boone has a diagnosis of insulin resistance based on her elevated fasting insulin level >5. Although Heather Boone's blood glucose readings are  still under good control, insulin resistance puts her at greater risk of metabolic syndrome and diabetes. She is working on diet and exercise and doing well on metformin to decrease risk of diabetes. She is trying to reduce simple carbs and notes decreased polyphagia.  ASSESSMENT AND PLAN:  Vitamin D deficiency - Plan: Vitamin D, Ergocalciferol, (DRISDOL) 1.25 MG (50000 UT) CAPS capsule  Insulin resistance - Plan: metFORMIN (GLUCOPHAGE) 500 MG tablet  Other depression - with emotional eating - Plan: buPROPion (WELLBUTRIN SR) 150 MG 12 hr tablet  Class 2 severe obesity with serious comorbidity and body mass index (BMI) of 37.0 to 37.9 in adult, unspecified obesity type (Heather Boone)  PLAN:   Vitamin D Deficiency Heather Boone was informed that low vitamin D levels contribute to fatigue and are associated with obesity, breast, and colon cancer. Heather Boone agrees to continue to increase her prescription Vit D @50 ,000 IU every 3 days #10 with no refills and will follow up for routine testing of vitamin D, at least 2-3 times per year. She was informed of the risk of over-replacement of vitamin D and agrees to not increase her dose unless she discusses this with [place] first. Heather Boone agrees to follow up in 3 weeks as directed.  Insulin Resistance Heather Boone will continue to work on weight loss, exercise, and decreasing simple carbohydrates in her diet to help decrease the risk of diabetes. She was informed that eating too many simple carbohydrates or too many calories at one sitting increases the likelihood of GI side effects. Heather Boone agreed to continue metformin 500 mg TID #90 with  no refills and prescription was written today. Heather Boone agreed to follow up with [place] as directed to monitor her progress.   Depression with Emotional Eating Behaviors We discussed behavior modification techniques today to help Heather Boone deal with her emotional eating and depression. She has agreed to take Wellbutrin SR 150 mg BID #60 with no  refills and agreed to follow up as directed.  Obesity Heather Boone is currently in the action stage of change. As such, her goal is to continue with weight loss efforts. She has agreed to keep a food journal with 1200 calories and 75+ grams of protein.  Heather Boone has been instructed to work up to a goal of 150 minutes of combined cardio and strengthening exercise per week for weight loss and overall health benefits. We discussed the following Behavioral Modification Strategies today: better snacking choices, ways to avoid boredom eating, and ways to avoid night time snacking.  Heather Boone has agreed to follow up with our clinic in 3 weeks. She was informed of the importance of frequent follow up visits to maximize her success with intensive lifestyle modifications for her multiple health conditions.  ALLERGIES: Allergies  Allergen Reactions  . Aleve [Naproxen Sodium]   . Motrin [Ibuprofen]   . Oxycodone-Acetaminophen   . Tramadol     MEDICATIONS: Current Outpatient Medications on File Prior to Visit  Medication Sig Dispense Refill  . Prenatal Vit-Fe Fumarate-FA (PNV PRENATAL PLUS MULTIVITAMIN) 27-1 MG TABS Take 1 tablet by mouth daily. 90 tablet 4   No current facility-administered medications on file prior to visit.     PAST MEDICAL HISTORY: Past Medical History:  Diagnosis Date  . Back pain   . Constipation   . Heartburn   . Obesity     PAST SURGICAL HISTORY: Past Surgical History:  Procedure Laterality Date  . LAPAROSCOPIC GASTRIC BANDING     dec 2011    SOCIAL HISTORY: Social History   Tobacco Use  . Smoking status: Never Smoker  . Smokeless tobacco: Never Used  Substance Use Topics  . Alcohol use: No  . Drug use: No    FAMILY HISTORY: Family History  Problem Relation Age of Onset  . Hyperlipidemia Mother   . Hyperlipidemia Father   . Heart disease Father   . Cancer Father   . Obesity Father     ROS: Review of Systems  Constitutional: Positive for  malaise/fatigue.  Cardiovascular: Negative for palpitations.  Gastrointestinal: Negative for nausea and vomiting.  Musculoskeletal:       Negative for muscle weakness.  Endo/Heme/Allergies:       Negative for hypoglycemia. Positive for polyphagia.  Psychiatric/Behavioral: Positive for depression. The patient does not have insomnia.     PHYSICAL EXAM: Pt in no acute distress  RECENT LABS AND TESTS: BMET    Component Value Date/Time   NA 139 12/04/2018 0928   K 4.2 12/04/2018 0928   CL 104 12/04/2018 0928   CO2 21 12/04/2018 0928   GLUCOSE 83 12/04/2018 0928   GLUCOSE 93 01/13/2010 0220   BUN 13 12/04/2018 0928   CREATININE 0.68 12/04/2018 0928   CALCIUM 9.2 12/04/2018 0928   GFRNONAA 114 12/04/2018 0928   GFRAA 131 12/04/2018 0928   Lab Results  Component Value Date   HGBA1C 5.3 12/04/2018   HGBA1C 5.2 03/04/2018   HGBA1C 5.1 11/01/2017   HGBA1C 5.5 07/13/2017   Lab Results  Component Value Date   INSULIN 14.4 12/04/2018   INSULIN 8.5 03/04/2018   INSULIN 14.2 11/01/2017  INSULIN 11.0 07/13/2017   CBC    Component Value Date/Time   WBC 6.8 03/04/2018 0740   WBC 12.2 (H) 01/13/2010 0205   RBC 4.44 03/04/2018 0740   RBC 5.05 01/13/2010 0205   HGB 11.7 03/04/2018 0740   HCT 37.1 03/04/2018 0740   PLT 383 01/13/2010 0205   MCV 84 03/04/2018 0740   MCH 26.4 (L) 03/04/2018 0740   MCHC 31.5 03/04/2018 0740   MCHC 33.9 01/13/2010 0205   RDW 14.6 03/04/2018 0740   LYMPHSABS 2.4 03/04/2018 0740   MONOABS 0.7 01/13/2010 0205   EOSABS 0.1 03/04/2018 0740   BASOSABS 0.0 03/04/2018 0740   Iron/TIBC/Ferritin/ %Sat No results found for: IRON, TIBC, FERRITIN, IRONPCTSAT Lipid Panel     Component Value Date/Time   CHOL 159 03/04/2018 0740   TRIG 44 03/04/2018 0740   HDL 72 03/04/2018 0740   LDLCALC 78 03/04/2018 0740   Hepatic Function Panel     Component Value Date/Time   PROT 6.5 12/04/2018 0928   ALBUMIN 3.7 12/04/2018 0928   AST 17 12/04/2018 0928    ALT 9 12/04/2018 0928   ALKPHOS 77 12/04/2018 0928   BILITOT 0.3 12/04/2018 0928   BILIDIR 0.2 01/13/2010 0205   IBILI 0.8 01/13/2010 0205      Component Value Date/Time   TSH 4.090 03/04/2018 0740   TSH 1.650 07/13/2017 1030   Results for SHATASHA, LAMBING (MRN [id number]) as of 04/21/2019 16:16  Ref. Range 12/04/2018 09:28  Vitamin D, 25-Hydroxy Latest Ref Range: 30.0 - 100.0 ng/mL 25.0 (L)     I, Marcille Blanco, CMA, am acting as transcriptionist for Starlyn Skeans, MD I have reviewed the above documentation for accuracy and completeness, and I agree with the above. -Dennard Nip, MD

## 2019-04-22 ENCOUNTER — Ambulatory Visit (INDEPENDENT_AMBULATORY_CARE_PROVIDER_SITE_OTHER): Payer: 59 | Admitting: Obstetrics & Gynecology

## 2019-04-22 ENCOUNTER — Encounter: Payer: Self-pay | Admitting: Obstetrics & Gynecology

## 2019-04-22 VITALS — BP 126/86

## 2019-04-22 DIAGNOSIS — Z3169 Encounter for other general counseling and advice on procreation: Secondary | ICD-10-CM | POA: Diagnosis not present

## 2019-04-22 DIAGNOSIS — N898 Other specified noninflammatory disorders of vagina: Secondary | ICD-10-CM | POA: Diagnosis not present

## 2019-04-22 LAB — WET PREP FOR TRICH, YEAST, CLUE

## 2019-04-22 MED ORDER — FLUCONAZOLE 150 MG PO TABS: 150.0000 mg | ORAL_TABLET | Freq: Every day | ORAL | 2 refills | Status: AC

## 2019-04-22 MED ORDER — PNV PRENATAL PLUS MULTIVITAMIN 27-1 MG PO TABS: 1.0000 | ORAL_TABLET | Freq: Every day | ORAL | 4 refills | Status: DC

## 2019-04-22 NOTE — Progress Notes (Signed)
    Heather Boone 02-24-1981 [id number]        38 y.o.  G1P0A1 Married.    RP: Vaginal discharge with odor x 1 week  HPI: Vaginal discharge with odor and irritation x 1 week.  Not sexually active as husband is taking care of family members out of town (Covid-19 related).  No pelvic pain.  Menses normal regular every month.  Would like to renew PNVs as they will attempt conception next month.   OB History  Gravida Para Term Preterm AB Living  1 0       0  SAB TAB Ectopic Multiple Live Births               # Outcome Date GA Lbr Len/2nd Weight Sex Delivery Anes PTL Lv  1 Gravida             Past medical history,surgical history, problem list, medications, allergies, family history and social history were all reviewed and documented in the EPIC chart.   Directed ROS with pertinent positives and negatives documented in the history of present illness/assessment and plan.  Exam:  Vitals:   04/22/19 0822  BP: 126/86   General appearance:  Normal  Abdomen: Normal  Gynecologic exam: Vulva normal.  Speculum:  Cervix/Vagina normal.  Increased vaginal discharge.  Wet prep done.  Wet prep:  Yeasts present.   Assessment/Plan:  38 y.o. G1P0   1. Vaginal discharge Yeast vaginitis confirmed by wet prep.  Will treat with fluconazole 150 mg 1 tablet daily for 3 days.  Usage reviewed and prescription sent to pharmacy. - WET PREP FOR [place], YEAST, CLUE  2. Encounter for preconception consultation Planning to attempt conception next month.  Recommend healthy nutrition and regular physical activities.  Prenatal vitamins prescribed.  Other orders - fluconazole (DIFLUCAN) 150 MG tablet; Take 1 tablet (150 mg total) by mouth daily for 3 days. - Prenatal Vit-Fe Fumarate-FA (PNV PRENATAL PLUS MULTIVITAMIN) 27-1 MG TABS; Take 1 tablet by mouth daily.  Counseling on above issues and coordination of care more than 50% for 15 minutes.  [name], 8:32 AM 04/22/2019

## 2019-04-22 NOTE — Patient Instructions (Signed)
1. Vaginal discharge Yeast vaginitis confirmed by wet prep.  Will treat with fluconazole 150 mg 1 tablet daily for 3 days.  Usage reviewed and prescription sent to pharmacy. - WET PREP FOR [place], YEAST, CLUE  2. Encounter for preconception consultation Planning to attempt conception next month.  Recommend healthy nutrition and regular physical activities.  Prenatal vitamins prescribed.  Other orders - fluconazole (DIFLUCAN) 150 MG tablet; Take 1 tablet (150 mg total) by mouth daily for 3 days. - Prenatal Vit-Fe Fumarate-FA (PNV PRENATAL PLUS MULTIVITAMIN) 27-1 MG TABS; Take 1 tablet by mouth daily.  Heather Boone, it was a pleasure seeing you today!

## 2019-05-01 ENCOUNTER — Other Ambulatory Visit: Payer: Self-pay | Admitting: Obstetrics & Gynecology

## 2019-05-01 DIAGNOSIS — E282 Polycystic ovarian syndrome: Secondary | ICD-10-CM

## 2019-05-01 NOTE — Telephone Encounter (Signed)
Dr. Wendall Mola you want to see her for visit to discuss this medication?

## 2019-05-12 ENCOUNTER — Other Ambulatory Visit: Payer: Self-pay

## 2019-05-12 ENCOUNTER — Encounter (INDEPENDENT_AMBULATORY_CARE_PROVIDER_SITE_OTHER): Payer: Self-pay | Admitting: Family Medicine

## 2019-05-12 ENCOUNTER — Ambulatory Visit (INDEPENDENT_AMBULATORY_CARE_PROVIDER_SITE_OTHER): Payer: 59 | Admitting: Family Medicine

## 2019-05-12 DIAGNOSIS — F3289 Other specified depressive episodes: Secondary | ICD-10-CM

## 2019-05-12 DIAGNOSIS — E8881 Metabolic syndrome: Secondary | ICD-10-CM | POA: Diagnosis not present

## 2019-05-12 DIAGNOSIS — Z6837 Body mass index (BMI) 37.0-37.9, adult: Secondary | ICD-10-CM

## 2019-05-12 DIAGNOSIS — E559 Vitamin D deficiency, unspecified: Secondary | ICD-10-CM | POA: Diagnosis not present

## 2019-05-12 MED ORDER — METFORMIN HCL 500 MG PO TABS: 500.0000 mg | ORAL_TABLET | Freq: Three times a day (TID) | ORAL | 0 refills | Status: DC

## 2019-05-12 MED ORDER — VITAMIN D (ERGOCALCIFEROL) 1.25 MG (50000 UNIT) PO CAPS: 50000.0000 [IU] | ORAL_CAPSULE | ORAL | 0 refills | Status: DC

## 2019-05-12 MED ORDER — BUPROPION HCL ER (SR) 150 MG PO TB12: 150.0000 mg | ORAL_TABLET | Freq: Two times a day (BID) | ORAL | 0 refills | Status: DC

## 2019-05-13 NOTE — Progress Notes (Signed)
Office: [phone]  /  Fax: [phone] TeleHealth Visit:  Heather Boone has verbally consented to this TeleHealth visit today. The patient is located at home, the provider is located at the [facility] office. The participants in this visit include the listed provider and patient. The visit was conducted today via Face Time.  HPI:   Chief Complaint: OBESITY Heather Boone is here to discuss her progress with her obesity treatment plan. She is keeping a food journal with 1200 calories and 75+ grams of protein and is following her eating plan approximately 85 % of the time. She states she is walking 30 minutes 3 times per week. Heather Boone feels that she is doing well keeping a journal. She has been eating more fruit and vegetables and is working on meeting her protein goals. Her hunger is controlled.  We were unable to weigh the patient today for this TeleHealth visit. She feels as if she has lost weight since her last visit. She has lost 17 lbs since starting treatment with [place].  Insulin Resistance Heather Boone has a diagnosis of insulin resistance based on her elevated fasting insulin level >5. Although Heather Boone's blood glucose readings are still under good control, insulin resistance puts her at greater risk of metabolic syndrome and diabetes. She is stable on metformin and her diet currently. She is doing well with weight loss and continues to work on diet and exercise to decrease risk of diabetes. She denies nausea, vomiting, or hypoglycemia.   Depression with emotional eating behaviors Heather Boone's mood is stable on Wellbutrin. She is sleeping well and feels that she is doing less stress eating. She has been working on behavior modification techniques to help reduce her emotional eating and has been somewhat successful. She shows no sign of suicidal or homicidal ideations.  Vitamin D Deficiency Heather Boone has a diagnosis of vitamin D deficiency. She is currently stable on vit D, but is  not yet at goal. Heather Boone notes that  fatigue is improving and denies nausea, vomiting, or muscle weakness.  ASSESSMENT AND PLAN:  Prediabetes - Plan: metFORMIN (GLUCOPHAGE) 500 MG tablet  Vitamin D deficiency - Plan: Vitamin D, Ergocalciferol, (DRISDOL) 1.25 MG (50000 UT) CAPS capsule  Other depression - with emotional eating - Plan: buPROPion (WELLBUTRIN SR) 150 MG 12 hr tablet  Class 2 severe obesity with serious comorbidity and body mass index (BMI) of 37.0 to 37.9 in adult, unspecified obesity type ([place])  PLAN:  Insulin Resistance Heather Boone will continue to work on weight loss, exercise, and decreasing simple carbohydrates in her diet to help decrease the risk of diabetes. She was informed that eating too many simple carbohydrates or too many calories at one sitting increases the likelihood of GI side effects. Heather Boone agreed to continue metformin 500 mg TID #90 with no refills and a prescription was written today. Heather Boone agreed to follow up with [place] as directed to monitor her progress in 3 weeks.  Depression with Emotional Eating Behaviors We discussed behavior modification techniques today to help Heather Boone deal with her emotional eating and depression. She has agreed to take Wellbutrin SR 150 mg BID #60 with no refills and agreed to follow up as directed.  Vitamin D Deficiency Heather Boone was informed that low vitamin D levels contribute to fatigue and are associated with obesity, breast, and colon cancer. Heather Boone agrees to continue to take prescription Vit D @50 ,000 IU every 3 days #10 with no refills and will follow up for routine testing of vitamin D, at least 2-3  times per year. She was informed of the risk of over-replacement of vitamin D and agrees to not increase her dose unless she discusses this with [place] first. Heather Boone agrees to follow up in 3 weeks as directed.  Obesity Heather Boone is currently in the action stage of change. As such, her goal is to continue with weight loss  efforts. She has agreed to keep a food journal with 1200 calories and 75+ grams of protein.  Heather Boone has been instructed to work up to a goal of 150 minutes of combined cardio and strengthening exercise per week for weight loss and overall health benefits. We discussed the following Behavioral Modification Strategies today: work on meal planning and easy cooking plans. We discussed easy summer recipes to help her meet her goals.  Heather Boone has agreed to follow up with our clinic in 3 weeks. She was informed of the importance of frequent follow up visits to maximize her success with intensive lifestyle modifications for her multiple health conditions.  ALLERGIES: Allergies  Allergen Reactions  . Aleve [Naproxen Sodium]   . Motrin [Ibuprofen]   . Oxycodone-Acetaminophen   . Tramadol     MEDICATIONS: Current Outpatient Medications on File Prior to Visit  Medication Sig Dispense Refill  . Prenatal Vit-Fe Fumarate-FA (PNV PRENATAL PLUS MULTIVITAMIN) 27-1 MG TABS Take 1 tablet by mouth daily. 90 tablet 4   No current facility-administered medications on file prior to visit.     PAST MEDICAL HISTORY: Past Medical History:  Diagnosis Date  . Back pain   . Constipation   . Heartburn   . Obesity     PAST SURGICAL HISTORY: Past Surgical History:  Procedure Laterality Date  . LAPAROSCOPIC GASTRIC BANDING     dec 2011    SOCIAL HISTORY: Social History   Tobacco Use  . Smoking status: Never Smoker  . Smokeless tobacco: Never Used  Substance Use Topics  . Alcohol use: No  . Drug use: No    FAMILY HISTORY: Family History  Problem Relation Age of Onset  . Hyperlipidemia Mother   . Hyperlipidemia Father   . Heart disease Father   . Cancer Father   . Obesity Father     ROS: Review of Systems  Constitutional: Positive for malaise/fatigue.  Cardiovascular: Negative for palpitations.  Gastrointestinal: Negative for nausea and vomiting.  Musculoskeletal:       Negative  for muscle weakness.  Endo/Heme/Allergies:       Negative for hypoglycemia.    PHYSICAL EXAM: Pt in no acute distress  RECENT LABS AND TESTS: BMET    Component Value Date/Time   NA 139 12/04/2018 0928   K 4.2 12/04/2018 0928   CL 104 12/04/2018 0928   CO2 21 12/04/2018 0928   GLUCOSE 83 12/04/2018 0928   GLUCOSE 93 01/13/2010 0220   BUN 13 12/04/2018 0928   CREATININE 0.68 12/04/2018 0928   CALCIUM 9.2 12/04/2018 0928   GFRNONAA 114 12/04/2018 0928   GFRAA 131 12/04/2018 0928   Lab Results  Component Value Date   HGBA1C 5.3 12/04/2018   HGBA1C 5.2 03/04/2018   HGBA1C 5.1 11/01/2017   HGBA1C 5.5 07/13/2017   Lab Results  Component Value Date   INSULIN 14.4 12/04/2018   INSULIN 8.5 03/04/2018   INSULIN 14.2 11/01/2017   INSULIN 11.0 07/13/2017   CBC    Component Value Date/Time   WBC 6.8 03/04/2018 0740   WBC 12.2 (H) 01/13/2010 0205   RBC 4.44 03/04/2018 0740   RBC 5.05 01/13/2010 0205  HGB 11.7 03/04/2018 0740   HCT 37.1 03/04/2018 0740   PLT 383 01/13/2010 0205   MCV 84 03/04/2018 0740   MCH 26.4 (L) 03/04/2018 0740   MCHC 31.5 03/04/2018 0740   MCHC 33.9 01/13/2010 0205   RDW 14.6 03/04/2018 0740   LYMPHSABS 2.4 03/04/2018 0740   MONOABS 0.7 01/13/2010 0205   EOSABS 0.1 03/04/2018 0740   BASOSABS 0.0 03/04/2018 0740   Iron/TIBC/Ferritin/ %Sat No results found for: IRON, TIBC, FERRITIN, IRONPCTSAT Lipid Panel     Component Value Date/Time   CHOL 159 03/04/2018 0740   TRIG 44 03/04/2018 0740   HDL 72 03/04/2018 0740   LDLCALC 78 03/04/2018 0740   Hepatic Function Panel     Component Value Date/Time   PROT 6.5 12/04/2018 0928   ALBUMIN 3.7 12/04/2018 0928   AST 17 12/04/2018 0928   ALT 9 12/04/2018 0928   ALKPHOS 77 12/04/2018 0928   BILITOT 0.3 12/04/2018 0928   BILIDIR 0.2 01/13/2010 0205   IBILI 0.8 01/13/2010 0205      Component Value Date/Time   TSH 4.090 03/04/2018 0740   TSH 1.650 07/13/2017 1030   Results for AMALIYA, WHITELAW (MRN [id number]) as of 05/13/2019 15:20  Ref. Range 12/04/2018 09:28  Vitamin D, 25-Hydroxy Latest Ref Range: 30.0 - 100.0 ng/mL 25.0 (L)    I, Marcille Blanco, CMA, am acting as transcriptionist for Starlyn Skeans, MD I have reviewed the above documentation for accuracy and completeness, and I agree with the above. -Dennard Nip, MD

## 2019-05-14 ENCOUNTER — Other Ambulatory Visit: Payer: 59

## 2019-05-14 ENCOUNTER — Other Ambulatory Visit: Payer: Self-pay

## 2019-05-15 ENCOUNTER — Other Ambulatory Visit: Payer: 59

## 2019-05-15 DIAGNOSIS — E282 Polycystic ovarian syndrome: Secondary | ICD-10-CM

## 2019-05-16 LAB — PROGESTERONE: Progesterone: 8.4 ng/mL

## 2019-05-20 DIAGNOSIS — N970 Female infertility associated with anovulation: Secondary | ICD-10-CM

## 2019-05-26 MED ORDER — CLOMIPHENE CITRATE 50 MG PO TABS: ORAL_TABLET | ORAL | 0 refills | Status: DC

## 2019-05-28 ENCOUNTER — Other Ambulatory Visit (INDEPENDENT_AMBULATORY_CARE_PROVIDER_SITE_OTHER): Payer: Self-pay | Admitting: Family Medicine

## 2019-05-28 DIAGNOSIS — E8881 Metabolic syndrome: Secondary | ICD-10-CM

## 2019-05-28 DIAGNOSIS — E559 Vitamin D deficiency, unspecified: Secondary | ICD-10-CM

## 2019-05-28 DIAGNOSIS — F3289 Other specified depressive episodes: Secondary | ICD-10-CM

## 2019-06-02 ENCOUNTER — Ambulatory Visit (INDEPENDENT_AMBULATORY_CARE_PROVIDER_SITE_OTHER): Payer: 59 | Admitting: Family Medicine

## 2019-06-02 ENCOUNTER — Encounter (INDEPENDENT_AMBULATORY_CARE_PROVIDER_SITE_OTHER): Payer: Self-pay | Admitting: Family Medicine

## 2019-06-02 ENCOUNTER — Other Ambulatory Visit: Payer: Self-pay

## 2019-06-02 DIAGNOSIS — Z6837 Body mass index (BMI) 37.0-37.9, adult: Secondary | ICD-10-CM

## 2019-06-02 DIAGNOSIS — E559 Vitamin D deficiency, unspecified: Secondary | ICD-10-CM | POA: Diagnosis not present

## 2019-06-03 MED ORDER — VITAMIN D (ERGOCALCIFEROL) 1.25 MG (50000 UNIT) PO CAPS: 50000.0000 [IU] | ORAL_CAPSULE | ORAL | 0 refills | Status: DC

## 2019-06-03 NOTE — Progress Notes (Signed)
Office: [phone]  /  Fax: [phone] TeleHealth Visit:  Heather Boone has verbally consented to this TeleHealth visit today. The patient is located at home, the provider is located at the [facility] office. The participants in this visit include the listed provider and patient. The visit was conducted today via face time.  HPI:   Chief Complaint: OBESITY Heather Boone is here to discuss her progress with her obesity treatment plan. She is on the keep a food journal with 1200 calories and 75+ grams of protein daily and is following her eating plan approximately 75 % of the time. She states she is walking for 30 minutes 2 times per week. Heather Boone continues to do well with weight loss and journaling. She hasn't weighed herself, but her pants feel looser, which is a good sign. Her hunger is controlled but she is getting bored and would like more summer meal ideas.  We were unable to weigh the patient today for this TeleHealth visit. She feels as if she has lost weight since her last visit. She has lost 17 lbs since starting treatment with [place].  Vitamin D Deficiency Heather Boone has a diagnosis of vitamin D deficiency. She is currently taking prescription Vit D, but level is not yet at goal. She denies nausea, vomiting or muscle weakness.  ASSESSMENT AND PLAN:  Vitamin D deficiency - Plan: Vitamin D, Ergocalciferol, (DRISDOL) 1.25 MG (50000 UT) CAPS capsule  Class 2 severe obesity with serious comorbidity and body mass index (BMI) of 37.0 to 37.9 in adult, unspecified obesity type ([place])  PLAN:  Vitamin D Deficiency Heather Boone was informed that low vitamin D levels contributes to fatigue and are associated with obesity, breast, and colon cancer. [name] agrees to continue taking prescription Vit D @50 ,000 IU every 3 days #10 and we will refill for 1 month. She will follow up for routine testing of vitamin D, at least 2-3 times per year. She was informed of the risk of  over-replacement of vitamin D and agrees to not increase her dose unless she discusses this with [place] first. Heather Boone agrees to follow up with our clinic in 2 weeks.  Obesity Heather Boone is currently in the action stage of change. As such, her goal is to continue with weight loss efforts She has agreed to keep a food journal with 1200 calories and 75+ grams of protein daily Heather Boone has been instructed to work up to a goal of 150 minutes of combined cardio and strengthening exercise per week for weight loss and overall health benefits. We discussed the following Behavioral Modification Strategies today: increasing lean protein intake and work on meal planning and easy cooking plans, better snacking choices, and planning for success We discussed "lighter" foods that will help her meet her calorie and protein goal.  Heather Boone has agreed to follow up with our clinic in 2 weeks. She was informed of the importance of frequent follow up visits to maximize her success with intensive lifestyle modifications for her multiple health conditions.  ALLERGIES: Allergies  Allergen Reactions  . Aleve [Naproxen Sodium]   . Motrin [Ibuprofen]   . Oxycodone-Acetaminophen   . Tramadol     MEDICATIONS: Current Outpatient Medications on File Prior to Visit  Medication Sig Dispense Refill  . buPROPion (WELLBUTRIN SR) 150 MG 12 hr tablet Take 1 tablet (150 mg total) by mouth 2 (two) times daily. 60 tablet 0  . clomiPHENE (CLOMID) 50 MG tablet Take one tab po qd Day 3-7 of cycle. 5 tablet  0  . metFORMIN (GLUCOPHAGE) 500 MG tablet Take 1 tablet (500 mg total) by mouth 3 (three) times daily. 90 tablet 0  . Prenatal Vit-Fe Fumarate-FA (PNV PRENATAL PLUS MULTIVITAMIN) 27-1 MG TABS Take 1 tablet by mouth daily. 90 tablet 4  . Vitamin D, Ergocalciferol, (DRISDOL) 1.25 MG (50000 UT) CAPS capsule Take 1 capsule (50,000 Units total) by mouth every 3 (three) days. 10 capsule 0   No current facility-administered medications on  file prior to visit.     PAST MEDICAL HISTORY: Past Medical History:  Diagnosis Date  . Back pain   . Constipation   . Heartburn   . Obesity     PAST SURGICAL HISTORY: Past Surgical History:  Procedure Laterality Date  . LAPAROSCOPIC GASTRIC BANDING     dec 2011    SOCIAL HISTORY: Social History   Tobacco Use  . Smoking status: Never Smoker  . Smokeless tobacco: Never Used  Substance Use Topics  . Alcohol use: No  . Drug use: No    FAMILY HISTORY: Family History  Problem Relation Age of Onset  . Hyperlipidemia Mother   . Hyperlipidemia Father   . Heart disease Father   . Cancer Father   . Obesity Father     ROS: Review of Systems  Constitutional: Positive for weight loss.  Gastrointestinal: Negative for nausea and vomiting.  Musculoskeletal:       Negative muscle weakness    PHYSICAL EXAM: Pt in no acute distress  RECENT LABS AND TESTS: BMET    Component Value Date/Time   NA 139 12/04/2018 0928   K 4.2 12/04/2018 0928   CL 104 12/04/2018 0928   CO2 21 12/04/2018 0928   GLUCOSE 83 12/04/2018 0928   GLUCOSE 93 01/13/2010 0220   BUN 13 12/04/2018 0928   CREATININE 0.68 12/04/2018 0928   CALCIUM 9.2 12/04/2018 0928   GFRNONAA 114 12/04/2018 0928   GFRAA 131 12/04/2018 0928   Lab Results  Component Value Date   HGBA1C 5.3 12/04/2018   HGBA1C 5.2 03/04/2018   HGBA1C 5.1 11/01/2017   HGBA1C 5.5 07/13/2017   Lab Results  Component Value Date   INSULIN 14.4 12/04/2018   INSULIN 8.5 03/04/2018   INSULIN 14.2 11/01/2017   INSULIN 11.0 07/13/2017   CBC    Component Value Date/Time   WBC 6.8 03/04/2018 0740   WBC 12.2 (H) 01/13/2010 0205   RBC 4.44 03/04/2018 0740   RBC 5.05 01/13/2010 0205   HGB 11.7 03/04/2018 0740   HCT 37.1 03/04/2018 0740   PLT 383 01/13/2010 0205   MCV 84 03/04/2018 0740   MCH 26.4 (L) 03/04/2018 0740   MCHC 31.5 03/04/2018 0740   MCHC 33.9 01/13/2010 0205   RDW 14.6 03/04/2018 0740   LYMPHSABS 2.4 03/04/2018  0740   MONOABS 0.7 01/13/2010 0205   EOSABS 0.1 03/04/2018 0740   BASOSABS 0.0 03/04/2018 0740   Iron/TIBC/Ferritin/ %Sat No results found for: IRON, TIBC, FERRITIN, IRONPCTSAT Lipid Panel     Component Value Date/Time   CHOL 159 03/04/2018 0740   TRIG 44 03/04/2018 0740   HDL 72 03/04/2018 0740   LDLCALC 78 03/04/2018 0740   Hepatic Function Panel     Component Value Date/Time   PROT 6.5 12/04/2018 0928   ALBUMIN 3.7 12/04/2018 0928   AST 17 12/04/2018 0928   ALT 9 12/04/2018 0928   ALKPHOS 77 12/04/2018 0928   BILITOT 0.3 12/04/2018 0928   BILIDIR 0.2 01/13/2010 0205   IBILI 0.8 01/13/2010  0205      Component Value Date/Time   TSH 4.090 03/04/2018 0740   TSH 1.650 07/13/2017 1030      I, Trixie Dredge, am acting as transcriptionist for Dennard Nip, MD I have reviewed the above documentation for accuracy and completeness, and I agree with the above. -Dennard Nip, MD

## 2019-06-10 DIAGNOSIS — Z6841 Body Mass Index (BMI) 40.0 and over, adult: Secondary | ICD-10-CM | POA: Insufficient documentation

## 2019-06-14 ENCOUNTER — Other Ambulatory Visit (INDEPENDENT_AMBULATORY_CARE_PROVIDER_SITE_OTHER): Payer: Self-pay | Admitting: Family Medicine

## 2019-06-14 DIAGNOSIS — E559 Vitamin D deficiency, unspecified: Secondary | ICD-10-CM

## 2019-06-16 ENCOUNTER — Ambulatory Visit (INDEPENDENT_AMBULATORY_CARE_PROVIDER_SITE_OTHER): Payer: 59 | Admitting: Family Medicine

## 2019-06-16 ENCOUNTER — Other Ambulatory Visit: Payer: Self-pay

## 2019-06-16 ENCOUNTER — Encounter (INDEPENDENT_AMBULATORY_CARE_PROVIDER_SITE_OTHER): Payer: Self-pay | Admitting: Family Medicine

## 2019-06-16 DIAGNOSIS — E559 Vitamin D deficiency, unspecified: Secondary | ICD-10-CM

## 2019-06-16 DIAGNOSIS — Z6837 Body mass index (BMI) 37.0-37.9, adult: Secondary | ICD-10-CM

## 2019-06-16 MED ORDER — VITAMIN D (ERGOCALCIFEROL) 1.25 MG (50000 UNIT) PO CAPS: 50000.0000 [IU] | ORAL_CAPSULE | ORAL | 0 refills | Status: DC

## 2019-06-17 NOTE — Progress Notes (Signed)
Office: [phone]  /  Fax: [phone] TeleHealth Visit:  Heather Boone has verbally consented to this TeleHealth visit today. The patient is located at home, the provider is located at the [facility] office. The participants in this visit include the listed provider and patient and any and all parties involved. The visit was conducted today via telephone. Heather Boone was unable to use realtime audiovisual technology today (FaceTime failed) and the telehealth visit was conducted via telephone.  HPI:   Chief Complaint: OBESITY Heather Boone is here to discuss her progress with her obesity treatment plan. She is on the keep a food journal with 1200 calories and 75 grams of protein daily plan and is following her eating plan approximately 80 % of the time. She states she is walking 30 minutes 2 times per week. [place] continues to do well with weight loss and she feels she has gotten back on track with her eating. She had gained approximately 15 pounds during isolations, but she is down 2 to 3 pounds over the last 2 weeks. We were unable to weigh the patient today for this TeleHealth visit. She feels as if she has lost weight since her last visit. She has lost 17 lbs since starting treatment with [place].  Vitamin D deficiency Heather Boone has a diagnosis of vitamin D deficiency. Heather Boone is currently taking vit D and she denies nausea, vomiting or muscle weakness.  ASSESSMENT AND PLAN:  Class 2 severe obesity with serious comorbidity and body mass index (BMI) of 37.0 to 37.9 in adult, unspecified obesity type ([place])  Vitamin D deficiency - Plan: Vitamin D, Ergocalciferol, (DRISDOL) 1.25 MG (50000 UT) CAPS capsule  PLAN:  Vitamin D Deficiency Heather Boone was informed that low vitamin D levels contributes to fatigue and are associated with obesity, breast, and colon cancer. She agrees to continue to take prescription Vit D @50 ,000 IU every 3 days #10 with no refills and will follow up for  routine testing of vitamin D, at least 2-3 times per year. She was informed of the risk of over-replacement of vitamin D and agrees to not increase her dose unless she discusses this with [place] first. We will check labs in 1 month at the next in-office visit. Heather Boone agrees to follow up as directed.  Obesity Heather Boone is currently in the action stage of change. As such, her goal is to continue with weight loss efforts She has agreed to keep a food journal with 1200 calories and 75+ grams of protein daily Heather Boone has been instructed to work up to a goal of 150 minutes of combined cardio and strengthening exercise per week for weight loss and overall health benefits. We discussed the following Behavioral Modification Strategies today: keep a strict food journal and work on meal planning and easy cooking plans  Heather Boone has agreed to follow up with our clinic in 2 weeks. She was informed of the importance of frequent follow up visits to maximize her success with intensive lifestyle modifications for her multiple health conditions.  ALLERGIES: Allergies  Allergen Reactions  . Aleve [Naproxen Sodium]   . Motrin [Ibuprofen]   . Oxycodone-Acetaminophen   . Tramadol     MEDICATIONS: Current Outpatient Medications on File Prior to Visit  Medication Sig Dispense Refill  . buPROPion (WELLBUTRIN SR) 150 MG 12 hr tablet Take 1 tablet (150 mg total) by mouth 2 (two) times daily. 60 tablet 0  . clomiPHENE (CLOMID) 50 MG tablet Take one tab po qd Day 3-7 of cycle.  5 tablet 0  . metFORMIN (GLUCOPHAGE) 500 MG tablet Take 1 tablet (500 mg total) by mouth 3 (three) times daily. 90 tablet 0  . Prenatal Vit-Fe Fumarate-FA (PNV PRENATAL PLUS MULTIVITAMIN) 27-1 MG TABS Take 1 tablet by mouth daily. 90 tablet 4   No current facility-administered medications on file prior to visit.     PAST MEDICAL HISTORY: Past Medical History:  Diagnosis Date  . Back pain   . Constipation   . Heartburn   . Obesity      PAST SURGICAL HISTORY: Past Surgical History:  Procedure Laterality Date  . LAPAROSCOPIC GASTRIC BANDING     dec 2011    SOCIAL HISTORY: Social History   Tobacco Use  . Smoking status: Never Smoker  . Smokeless tobacco: Never Used  Substance Use Topics  . Alcohol use: No  . Drug use: No    FAMILY HISTORY: Family History  Problem Relation Age of Onset  . Hyperlipidemia Mother   . Hyperlipidemia Father   . Heart disease Father   . Cancer Father   . Obesity Father     ROS: Review of Systems  Constitutional: Positive for weight loss.  Gastrointestinal: Negative for nausea and vomiting.  Musculoskeletal:       Negative for muscle weakness    PHYSICAL EXAM: Pt in no acute distress  RECENT LABS AND TESTS: BMET    Component Value Date/Time   NA 139 12/04/2018 0928   K 4.2 12/04/2018 0928   CL 104 12/04/2018 0928   CO2 21 12/04/2018 0928   GLUCOSE 83 12/04/2018 0928   GLUCOSE 93 01/13/2010 0220   BUN 13 12/04/2018 0928   CREATININE 0.68 12/04/2018 0928   CALCIUM 9.2 12/04/2018 0928   GFRNONAA 114 12/04/2018 0928   GFRAA 131 12/04/2018 0928   Lab Results  Component Value Date   HGBA1C 5.3 12/04/2018   HGBA1C 5.2 03/04/2018   HGBA1C 5.1 11/01/2017   HGBA1C 5.5 07/13/2017   Lab Results  Component Value Date   INSULIN 14.4 12/04/2018   INSULIN 8.5 03/04/2018   INSULIN 14.2 11/01/2017   INSULIN 11.0 07/13/2017   CBC    Component Value Date/Time   WBC 6.8 03/04/2018 0740   WBC 12.2 (H) 01/13/2010 0205   RBC 4.44 03/04/2018 0740   RBC 5.05 01/13/2010 0205   HGB 11.7 03/04/2018 0740   HCT 37.1 03/04/2018 0740   PLT 383 01/13/2010 0205   MCV 84 03/04/2018 0740   MCH 26.4 (L) 03/04/2018 0740   MCHC 31.5 03/04/2018 0740   MCHC 33.9 01/13/2010 0205   RDW 14.6 03/04/2018 0740   LYMPHSABS 2.4 03/04/2018 0740   MONOABS 0.7 01/13/2010 0205   EOSABS 0.1 03/04/2018 0740   BASOSABS 0.0 03/04/2018 0740   Iron/TIBC/Ferritin/ %Sat No results found for:  IRON, TIBC, FERRITIN, IRONPCTSAT Lipid Panel     Component Value Date/Time   CHOL 159 03/04/2018 0740   TRIG 44 03/04/2018 0740   HDL 72 03/04/2018 0740   LDLCALC 78 03/04/2018 0740   Hepatic Function Panel     Component Value Date/Time   PROT 6.5 12/04/2018 0928   ALBUMIN 3.7 12/04/2018 0928   AST 17 12/04/2018 0928   ALT 9 12/04/2018 0928   ALKPHOS 77 12/04/2018 0928   BILITOT 0.3 12/04/2018 0928   BILIDIR 0.2 01/13/2010 0205   IBILI 0.8 01/13/2010 0205      Component Value Date/Time   TSH 4.090 03/04/2018 0740   TSH 1.650 07/13/2017 1030  Ref. Range 12/04/2018 09:28  Vitamin D, 25-Hydroxy Latest Ref Range: 30.0 - 100.0 ng/mL 25.0 (L)    I, Doreene Nest, am acting as transcriptionist for Dennard Nip, MD I have reviewed the above documentation for accuracy and completeness, and I agree with the above. -Dennard Nip, MD

## 2019-06-30 ENCOUNTER — Other Ambulatory Visit: Payer: Self-pay | Admitting: Obstetrics & Gynecology

## 2019-06-30 DIAGNOSIS — N970 Female infertility associated with anovulation: Secondary | ICD-10-CM

## 2019-07-03 ENCOUNTER — Telehealth (INDEPENDENT_AMBULATORY_CARE_PROVIDER_SITE_OTHER): Payer: 59 | Admitting: Family Medicine

## 2019-07-03 ENCOUNTER — Encounter (INDEPENDENT_AMBULATORY_CARE_PROVIDER_SITE_OTHER): Payer: Self-pay | Admitting: Family Medicine

## 2019-07-03 ENCOUNTER — Other Ambulatory Visit: Payer: Self-pay

## 2019-07-03 DIAGNOSIS — Z9189 Other specified personal risk factors, not elsewhere classified: Secondary | ICD-10-CM | POA: Diagnosis not present

## 2019-07-03 DIAGNOSIS — E88819 Insulin resistance, unspecified: Secondary | ICD-10-CM

## 2019-07-03 DIAGNOSIS — F3289 Other specified depressive episodes: Secondary | ICD-10-CM | POA: Diagnosis not present

## 2019-07-03 DIAGNOSIS — E559 Vitamin D deficiency, unspecified: Secondary | ICD-10-CM

## 2019-07-03 DIAGNOSIS — E8881 Metabolic syndrome: Secondary | ICD-10-CM

## 2019-07-03 MED ORDER — BUPROPION HCL ER (SR) 150 MG PO TB12: 150.0000 mg | ORAL_TABLET | Freq: Two times a day (BID) | ORAL | 0 refills | Status: DC

## 2019-07-03 MED ORDER — METFORMIN HCL 500 MG PO TABS: 500.0000 mg | ORAL_TABLET | Freq: Three times a day (TID) | ORAL | 0 refills | Status: DC

## 2019-07-03 MED ORDER — VITAMIN D (ERGOCALCIFEROL) 1.25 MG (50000 UNIT) PO CAPS: 50000.0000 [IU] | ORAL_CAPSULE | ORAL | 0 refills | Status: DC

## 2019-07-07 ENCOUNTER — Other Ambulatory Visit: Payer: Self-pay

## 2019-07-07 ENCOUNTER — Other Ambulatory Visit: Payer: 59

## 2019-07-07 DIAGNOSIS — N970 Female infertility associated with anovulation: Secondary | ICD-10-CM

## 2019-07-08 LAB — PROGESTERONE: Progesterone: 18.5 ng/mL

## 2019-07-08 NOTE — Progress Notes (Signed)
Office: [phone]  /  Fax: [phone] TeleHealth Visit:  Heather Boone has verbally consented to this TeleHealth visit today. The patient is located at home, the provider is located at the [facility] office. The participants in this visit include the listed provider and patient. The visit was conducted today via face time.  HPI:   Chief Complaint: OBESITY Heather Boone is here to discuss her progress with her obesity treatment plan. She is on the keep a food journal with 1200 calories and 75+ grams of protein daily and is following her eating plan approximately 75 % of the time. She states she is walking for 30 minutes 2-3 times per week. Heather Boone states she is doing well with eating. She is meeting her protein goals regularly, but she has not journaled as often. Her hunger is controlled and she has started exercising.  We were unable to weigh the patient today for this TeleHealth visit. She feels as if she has lost weight since her last visit. She has lost 17 lbs since starting treatment with [place].  Insulin Resistance Heather Boone has a diagnosis of insulin resistance based on her elevated fasting insulin level >5. Although Heather Boone's blood glucose readings are still under good control, insulin resistance puts her at greater risk of metabolic syndrome and diabetes. She is doing well on metformin and denies nausea, vomiting, or hypoglycemia. She is working on decreasing simple carbohydrates and exercise to decrease risk of diabetes.  At risk for diabetes Heather Boone is at higher than average risk for developing diabetes due to her obesity and insulin resistance. She currently denies polyuria or polydipsia.  Vitamin D Deficiency Heather Boone has a diagnosis of vitamin D deficiency. She stable on prescription Vit D, but level is not yet at goal. She denies nausea, vomiting or muscle weakness.  Depression with Emotional Eating Behaviors Heather Boone's mood is stable on Wellbutrin and denies  palpitations or insomnia. She feels she is doing better with emotional eating. Heather Boone struggles with emotional eating and using food for comfort to the extent that it is negatively impacting her health. She often snacks when she is not hungry. Heather Boone sometimes feels she is out of control and then feels guilty that she made poor food choices. She has been working on behavior modification techniques to help reduce her emotional eating and has been somewhat successful. She shows no sign of suicidal or homicidal ideations.  Depression screen PHQ 2/9 07/13/2017  Decreased Interest 3  Down, Depressed, Hopeless 1  PHQ - 2 Score 4  Altered sleeping 3  Tired, decreased energy 3  Change in appetite 2  Feeling bad or failure about yourself  0  Trouble concentrating 0  Moving slowly or fidgety/restless 0  Suicidal thoughts 0  PHQ-9 Score 12    ASSESSMENT AND PLAN:  At risk for diabetes mellitus  Other depression - with emotional eating - Plan: buPROPion (WELLBUTRIN SR) 150 MG 12 hr tablet  Insulin resistance - Plan: metFORMIN (GLUCOPHAGE) 500 MG tablet  Vitamin D deficiency - Plan: Vitamin D, Ergocalciferol, (DRISDOL) 1.25 MG (50000 UT) CAPS capsule  PLAN:  Insulin Resistance Heather Boone will continue to work on weight loss, exercise, and decreasing simple carbohydrates in her diet to help decrease the risk of diabetes. We dicussed metformin including benefits and risks. She was informed that eating too many simple carbohydrates or too many calories at one sitting increases the likelihood of GI side effects. Heather Boone agrees to continue taking metformin 500 mg PO TID #90 and we will  refill for 1 month. Heather Boone agrees to follow up with our clinic in 2 weeks as directed to monitor her progress.  Diabetes risk counseling Heather Boone was given extended (15 minutes) diabetes prevention counseling today. She is 38 y.o. female and has risk factors for diabetes including obesity and insulin resistance. We  discussed intensive lifestyle modifications today with an emphasis on weight loss as well as increasing exercise and decreasing simple carbohydrates in her diet.  Vitamin D Deficiency Heather Boone was informed that low vitamin D levels contributes to fatigue and are associated with obesity, breast, and colon cancer. Heather Boone agrees to continue taking prescription Vit D 50,000 IU every 3 days #10 and we will refill for 1 month. She will follow up for routine testing of vitamin D, at least 2-3 times per year. She was informed of the risk of over-replacement of vitamin D and agrees to not increase her dose unless she discusses this with [place] first. We will recheck labs in 1 month. Heather Boone agrees to follow up with our clinic in 2 weeks.  Depression with Emotional Eating Behaviors We discussed behavior modification techniques today to help Heather Boone deal with her emotional eating and depression. Heather Boone agrees to continue taking Wellbutrin SR 150 mg PO BID #60 and we will refill for 1 month. Heather Boone agrees to follow up with our clinic in 2 weeks.  Obesity Heather Boone is currently in the action stage of change. As such, her goal is to continue with weight loss efforts She has agreed to keep a food journal with 1200-1300 calories and 75+ grams of protein daily Heather Boone has been instructed to work up to a goal of 150 minutes of combined cardio and strengthening exercise per week for weight loss and overall health benefits. We discussed the following Behavioral Modification Strategies today: increasing lean protein intake and decreasing simple carbohydrates    Heather Boone has agreed to follow up with our clinic in 2 weeks. She was informed of the importance of frequent follow up visits to maximize her success with intensive lifestyle modifications for her multiple health conditions.  ALLERGIES: Allergies  Allergen Reactions   Aleve [Naproxen Sodium]    Motrin [Ibuprofen]    Oxycodone-Acetaminophen     Tramadol     MEDICATIONS: Current Outpatient Medications on File Prior to Visit  Medication Sig Dispense Refill   Prenatal Vit-Fe Fumarate-FA (PNV PRENATAL PLUS MULTIVITAMIN) 27-1 MG TABS Take 1 tablet by mouth daily. 90 tablet 4   clomiPHENE (CLOMID) 50 MG tablet TAKE ONE TABLET DAILY ON DAYS 3-7 OF CYCLE. 5 tablet 0   No current facility-administered medications on file prior to visit.     PAST MEDICAL HISTORY: Past Medical History:  Diagnosis Date   Back pain    Constipation    Heartburn    Obesity     PAST SURGICAL HISTORY: Past Surgical History:  Procedure Laterality Date   LAPAROSCOPIC GASTRIC BANDING     dec 2011    SOCIAL HISTORY: Social History   Tobacco Use   Smoking status: Never Smoker   Smokeless tobacco: Never Used  Substance Use Topics   Alcohol use: No   Drug use: No    FAMILY HISTORY: Family History  Problem Relation Age of Onset   Hyperlipidemia Mother    Hyperlipidemia Father    Heart disease Father    Cancer Father    Obesity Father     ROS: Review of Systems  Constitutional: Negative for weight loss.  Cardiovascular: Negative for palpitations.  Gastrointestinal: Negative for  nausea and vomiting.  Genitourinary: Negative for frequency.  Musculoskeletal:       Negative muscle weakness  Endo/Heme/Allergies: Negative for polydipsia.       Negative hypoglycemia  Psychiatric/Behavioral: Positive for depression. Negative for suicidal ideas. The patient does not have insomnia.     PHYSICAL EXAM: Pt in no acute distress  RECENT LABS AND TESTS: BMET    Component Value Date/Time   NA 139 12/04/2018 0928   K 4.2 12/04/2018 0928   CL 104 12/04/2018 0928   CO2 21 12/04/2018 0928   GLUCOSE 83 12/04/2018 0928   GLUCOSE 93 01/13/2010 0220   BUN 13 12/04/2018 0928   CREATININE 0.68 12/04/2018 0928   CALCIUM 9.2 12/04/2018 0928   GFRNONAA 114 12/04/2018 0928   GFRAA 131 12/04/2018 0928   Lab Results  Component Value  Date   HGBA1C 5.3 12/04/2018   HGBA1C 5.2 03/04/2018   HGBA1C 5.1 11/01/2017   HGBA1C 5.5 07/13/2017   Lab Results  Component Value Date   INSULIN 14.4 12/04/2018   INSULIN 8.5 03/04/2018   INSULIN 14.2 11/01/2017   INSULIN 11.0 07/13/2017   CBC    Component Value Date/Time   WBC 6.8 03/04/2018 0740   WBC 12.2 (H) 01/13/2010 0205   RBC 4.44 03/04/2018 0740   RBC 5.05 01/13/2010 0205   HGB 11.7 03/04/2018 0740   HCT 37.1 03/04/2018 0740   PLT 383 01/13/2010 0205   MCV 84 03/04/2018 0740   MCH 26.4 (L) 03/04/2018 0740   MCHC 31.5 03/04/2018 0740   MCHC 33.9 01/13/2010 0205   RDW 14.6 03/04/2018 0740   LYMPHSABS 2.4 03/04/2018 0740   MONOABS 0.7 01/13/2010 0205   EOSABS 0.1 03/04/2018 0740   BASOSABS 0.0 03/04/2018 0740   Iron/TIBC/Ferritin/ %Sat No results found for: IRON, TIBC, FERRITIN, IRONPCTSAT Lipid Panel     Component Value Date/Time   CHOL 159 03/04/2018 0740   TRIG 44 03/04/2018 0740   HDL 72 03/04/2018 0740   LDLCALC 78 03/04/2018 0740   Hepatic Function Panel     Component Value Date/Time   PROT 6.5 12/04/2018 0928   ALBUMIN 3.7 12/04/2018 0928   AST 17 12/04/2018 0928   ALT 9 12/04/2018 0928   ALKPHOS 77 12/04/2018 0928   BILITOT 0.3 12/04/2018 0928   BILIDIR 0.2 01/13/2010 0205   IBILI 0.8 01/13/2010 0205      Component Value Date/Time   TSH 4.090 03/04/2018 0740   TSH 1.650 07/13/2017 1030      I, Trixie Dredge, am acting as Location manager for Dennard Nip, MD I have reviewed the above documentation for accuracy and completeness, and I agree with the above. -Dennard Nip, MD

## 2019-07-09 DIAGNOSIS — N970 Female infertility associated with anovulation: Secondary | ICD-10-CM

## 2019-07-11 MED ORDER — CLOMIPHENE CITRATE 50 MG PO TABS: ORAL_TABLET | ORAL | 0 refills | Status: DC

## 2019-07-17 ENCOUNTER — Other Ambulatory Visit: Payer: Self-pay

## 2019-07-17 ENCOUNTER — Telehealth (INDEPENDENT_AMBULATORY_CARE_PROVIDER_SITE_OTHER): Payer: 59 | Admitting: Family Medicine

## 2019-07-17 ENCOUNTER — Encounter (INDEPENDENT_AMBULATORY_CARE_PROVIDER_SITE_OTHER): Payer: Self-pay | Admitting: Family Medicine

## 2019-07-17 DIAGNOSIS — Z6837 Body mass index (BMI) 37.0-37.9, adult: Secondary | ICD-10-CM

## 2019-07-17 DIAGNOSIS — F3289 Other specified depressive episodes: Secondary | ICD-10-CM

## 2019-07-17 DIAGNOSIS — E8881 Metabolic syndrome: Secondary | ICD-10-CM | POA: Diagnosis not present

## 2019-07-17 DIAGNOSIS — E559 Vitamin D deficiency, unspecified: Secondary | ICD-10-CM

## 2019-07-17 MED ORDER — VITAMIN D (ERGOCALCIFEROL) 1.25 MG (50000 UNIT) PO CAPS: 50000.0000 [IU] | ORAL_CAPSULE | ORAL | 0 refills | Status: DC

## 2019-07-17 MED ORDER — METFORMIN HCL 500 MG PO TABS: 500.0000 mg | ORAL_TABLET | Freq: Three times a day (TID) | ORAL | 0 refills | Status: DC

## 2019-07-17 MED ORDER — BUPROPION HCL ER (SR) 200 MG PO TB12: 200.0000 mg | ORAL_TABLET | Freq: Two times a day (BID) | ORAL | 60 refills | Status: DC

## 2019-07-18 ENCOUNTER — Other Ambulatory Visit (INDEPENDENT_AMBULATORY_CARE_PROVIDER_SITE_OTHER): Payer: Self-pay | Admitting: Family Medicine

## 2019-07-18 DIAGNOSIS — F3289 Other specified depressive episodes: Secondary | ICD-10-CM

## 2019-07-21 NOTE — Progress Notes (Signed)
Office: [phone]  /  Fax: [phone] TeleHealth Visit:  Heather Boone has verbally consented to this TeleHealth visit today. The patient is located at work, the provider is located at the [employer] [facility] office. The participants in this visit include the listed provider and patient and any and all parties involved. The visit was conducted today via FaceTime.  HPI:   Chief Complaint: OBESITY Heather Boone is here to discuss her progress with her obesity treatment plan. She is on the keep a food journal with 1200 to 1300 calories and 75+ grams of protein daily plan and is following her eating plan approximately 50 % of the time. She states she is walking 30 minutes 2 times per week. Heather Boone has been journaling on and off, but she feel like she is gaining weight, due to her clothes being tighter. She has been trying different recipes, but none of the recipes have enough protein. We were unable to weigh the patient today for this TeleHealth visit. She feels as if she has gained weight since her last visit. She has lost 17 lbs since starting treatment with [place].  Vitamin D deficiency Heather Boone has a diagnosis of vitamin D deficiency. Heather Boone is stable on vit D, but her last level was not at goal. She denies nausea, vomiting or muscle weakness.  Insulin Resistance Heather Boone has a diagnosis of insulin resistance based on her elevated fasting insulin level >5. Although Heather Boone's blood glucose readings are still under good control, insulin resistance puts her at greater risk of metabolic syndrome and diabetes. She is stable on metformin and polyphagia has decreased. Heather Boone denies nausea, vomiting or hypoglycemia. She is struggling to decrease simple carbohydrates in her diet lately. Heather Boone continues to work on diet and exercise to decrease risk of diabetes.  Depression with emotional eating behaviors Heather Boone is still struggling with emotional eating and comfort eating to the extent  that it is negatively impacting her health. She often snacks when she is not hungry. Heather Boone sometimes feels she is out of control and then feels guilty that she made poor food choices. She feels the Wellbutrin has helped, but not enough. She has been working on behavior modification techniques to help reduce her emotional eating and has been somewhat successful. She shows no sign of suicidal or homicidal ideations.  ASSESSMENT AND PLAN:  Vitamin D deficiency - Plan: Vitamin D, Ergocalciferol, (DRISDOL) 1.25 MG (50000 UT) CAPS capsule  Other depression - with emotional eating - Plan: buPROPion (WELLBUTRIN SR) 200 MG 12 hr tablet  Insulin resistance - Plan: metFORMIN (GLUCOPHAGE) 500 MG tablet  Class 2 severe obesity with serious comorbidity and body mass index (BMI) of 37.0 to 37.9 in adult, unspecified obesity type ([place])  PLAN:  Vitamin D Deficiency Heather Boone was informed that low vitamin D levels contributes to fatigue and are associated with obesity, breast, and colon cancer. She agrees to continue to take prescription Vit D @50 ,000 IU every 3 days #10 with no refills and will follow up for routine testing of vitamin D, at least 2-3 times per year. She was informed of the risk of over-replacement of vitamin D and agrees to not increase her dose unless she discusses this with [place] first. We will check labs at the next visit and Heather Boone will follow up as directed.  Insulin Resistance Heather Boone will continue to work on weight loss, exercise, and decreasing simple carbohydrates in her diet to help decrease the risk of diabetes. We dicussed metformin including benefits and risks. She  was informed that eating too many simple carbohydrates or too many calories at one sitting increases the likelihood of GI side effects. Heather Boone agrees to take metformin 500 mg three times daily #90 with no refills and follow up with [place] as directed to monitor her progress. Heather Boone will get back to diet and we will check  labs at the next visit.  Depression with Emotional Eating Behaviors We discussed behavior modification techniques today to help Heather Boone deal with her emotional eating and depression. She has agreed to increase Wellbutrin SR to 200 mg BID #60 daily with no refills and will follow closely. Heather Boone agreed to follow up as directed.  Obesity Heather Boone is currently in the action stage of change. As such, her goal is to continue with weight loss efforts She has agreed to keep a food journal with 1200 to 1500 calories and 75 grams of protein daily Heather Boone has been instructed to work up to a goal of 150 minutes of combined cardio and strengthening exercise per week for weight loss and overall health benefits. We discussed the following Behavioral Modification Strategies today: keep a strict food journal, increasing lean protein intake, increasing vegetables and work on meal planning and easy cooking plans  Heather Boone has agreed to follow up with our clinic in 2 to 3 weeks. She was informed of the importance of frequent follow up visits to maximize her success with intensive lifestyle modifications for her multiple health conditions.  ALLERGIES: Allergies  Allergen Reactions  . Aleve [Naproxen Sodium]   . Motrin [Ibuprofen]   . Oxycodone-Acetaminophen   . Tramadol     MEDICATIONS: Current Outpatient Medications on File Prior to Visit  Medication Sig Dispense Refill  . clomiPHENE (CLOMID) 50 MG tablet TAKE ONE TABLET DAILY ON DAYS 3-7 OF CYCLE. 5 tablet 0  . Prenatal Vit-Fe Fumarate-FA (PNV PRENATAL PLUS MULTIVITAMIN) 27-1 MG TABS Take 1 tablet by mouth daily. 90 tablet 4   No current facility-administered medications on file prior to visit.     PAST MEDICAL HISTORY: Past Medical History:  Diagnosis Date  . Back pain   . Constipation   . Heartburn   . Obesity     PAST SURGICAL HISTORY: Past Surgical History:  Procedure Laterality Date  . LAPAROSCOPIC GASTRIC BANDING     dec 2011     SOCIAL HISTORY: Social History   Tobacco Use  . Smoking status: Never Smoker  . Smokeless tobacco: Never Used  Substance Use Topics  . Alcohol use: No  . Drug use: No    FAMILY HISTORY: Family History  Problem Relation Age of Onset  . Hyperlipidemia Mother   . Hyperlipidemia Father   . Heart disease Father   . Cancer Father   . Obesity Father     ROS: Review of Systems  Constitutional: Negative for weight loss.  Gastrointestinal: Negative for nausea and vomiting.  Musculoskeletal:       Negative for muscle weakness  Endo/Heme/Allergies:       Positive for polyphagia Negative for hypoglycemia  Psychiatric/Behavioral: Positive for depression. Negative for suicidal ideas.    PHYSICAL EXAM: Pt in no acute distress  RECENT LABS AND TESTS: BMET    Component Value Date/Time   NA 139 12/04/2018 0928   K 4.2 12/04/2018 0928   CL 104 12/04/2018 0928   CO2 21 12/04/2018 0928   GLUCOSE 83 12/04/2018 0928   GLUCOSE 93 01/13/2010 0220   BUN 13 12/04/2018 0928   CREATININE 0.68 12/04/2018 0928   CALCIUM  9.2 12/04/2018 0928   GFRNONAA 114 12/04/2018 0928   GFRAA 131 12/04/2018 0928   Lab Results  Component Value Date   HGBA1C 5.3 12/04/2018   HGBA1C 5.2 03/04/2018   HGBA1C 5.1 11/01/2017   HGBA1C 5.5 07/13/2017   Lab Results  Component Value Date   INSULIN 14.4 12/04/2018   INSULIN 8.5 03/04/2018   INSULIN 14.2 11/01/2017   INSULIN 11.0 07/13/2017   CBC    Component Value Date/Time   WBC 6.8 03/04/2018 0740   WBC 12.2 (H) 01/13/2010 0205   RBC 4.44 03/04/2018 0740   RBC 5.05 01/13/2010 0205   HGB 11.7 03/04/2018 0740   HCT 37.1 03/04/2018 0740   PLT 383 01/13/2010 0205   MCV 84 03/04/2018 0740   MCH 26.4 (L) 03/04/2018 0740   MCHC 31.5 03/04/2018 0740   MCHC 33.9 01/13/2010 0205   RDW 14.6 03/04/2018 0740   LYMPHSABS 2.4 03/04/2018 0740   MONOABS 0.7 01/13/2010 0205   EOSABS 0.1 03/04/2018 0740   BASOSABS 0.0 03/04/2018 0740    Iron/TIBC/Ferritin/ %Sat No results found for: IRON, TIBC, FERRITIN, IRONPCTSAT Lipid Panel     Component Value Date/Time   CHOL 159 03/04/2018 0740   TRIG 44 03/04/2018 0740   HDL 72 03/04/2018 0740   LDLCALC 78 03/04/2018 0740   Hepatic Function Panel     Component Value Date/Time   PROT 6.5 12/04/2018 0928   ALBUMIN 3.7 12/04/2018 0928   AST 17 12/04/2018 0928   ALT 9 12/04/2018 0928   ALKPHOS 77 12/04/2018 0928   BILITOT 0.3 12/04/2018 0928   BILIDIR 0.2 01/13/2010 0205   IBILI 0.8 01/13/2010 0205      Component Value Date/Time   TSH 4.090 03/04/2018 0740   TSH 1.650 07/13/2017 1030     Ref. Range 12/04/2018 09:28  Vitamin D, 25-Hydroxy Latest Ref Range: 30.0 - 100.0 ng/mL 25.0 (L)    I, Doreene Nest, am acting as transcriptionist for Dennard Nip, MD I have reviewed the above documentation for accuracy and completeness, and I agree with the above. -Dennard Nip, MD

## 2019-08-04 ENCOUNTER — Other Ambulatory Visit: Payer: Self-pay

## 2019-08-04 ENCOUNTER — Ambulatory Visit (INDEPENDENT_AMBULATORY_CARE_PROVIDER_SITE_OTHER): Payer: 59 | Admitting: Family Medicine

## 2019-08-05 ENCOUNTER — Ambulatory Visit (INDEPENDENT_AMBULATORY_CARE_PROVIDER_SITE_OTHER): Payer: 59 | Admitting: Obstetrics & Gynecology

## 2019-08-05 ENCOUNTER — Encounter: Payer: Self-pay | Admitting: Obstetrics & Gynecology

## 2019-08-05 VITALS — BP 118/80 | Ht 62.5 in | Wt 221.0 lb

## 2019-08-05 DIAGNOSIS — Z01419 Encounter for gynecological examination (general) (routine) without abnormal findings: Secondary | ICD-10-CM | POA: Diagnosis not present

## 2019-08-05 DIAGNOSIS — Z1151 Encounter for screening for human papillomavirus (HPV): Secondary | ICD-10-CM

## 2019-08-05 DIAGNOSIS — Z6839 Body mass index (BMI) 39.0-39.9, adult: Secondary | ICD-10-CM

## 2019-08-05 DIAGNOSIS — E6609 Other obesity due to excess calories: Secondary | ICD-10-CM

## 2019-08-05 DIAGNOSIS — N898 Other specified noninflammatory disorders of vagina: Secondary | ICD-10-CM

## 2019-08-05 DIAGNOSIS — Z3169 Encounter for other general counseling and advice on procreation: Secondary | ICD-10-CM

## 2019-08-05 MED ORDER — TINIDAZOLE 500 MG PO TABS: 2.0000 g | ORAL_TABLET | Freq: Every day | ORAL | 0 refills | Status: AC

## 2019-08-05 NOTE — Patient Instructions (Signed)
1. Encounter for routine gynecological examination with Papanicolaou smear of cervix Normal gynecologic exam.  Pap test with high-risk HPV done.  Breast exam normal.  2. Encounter for preconception consultation Fiance will consult with Urology.  Low sperm count in the past.  Will stop using Clomid during husband's investigation and treatment as indicated.  3. Vaginal odor Bacterial vaginosis confirmed by wet prep.  Diagnosis discussed with patient and decision made to treat with tinidazole.  Usage reviewed and prescription sent to pharmacy. - WET PREP FOR TRICH, YEAST, CLUE  4. Class 2 obesity due to excess calories without serious comorbidity with body mass index (BMI) of 39.0 to 39.9 in adult Low calorie/carb diet such as [employer].  Aerobic physical activities 5 times a week and weightlifting every 2 days.  Other orders - tinidazole (TINDAMAX) 500 MG tablet; Take 4 tablets (2,000 mg total) by mouth daily for 2 days.  Heather Boone, it was a pleasure seeing you today!  I will inform you of your results as soon as they are available.

## 2019-08-05 NOTE — Progress Notes (Signed)
Heather Boone September 09, 1981 [id number]   History:    38 y.o. G1P0A1 Engaged.  Finishing Nursing in 1 year.  RP:  Established patient presenting for annual gyn exam   HPI: Menses regular normal every month.  No pelvic pain.  C/O vaginal odor.  No pain with IC.  Has been on Clomid.  Fiance had a low Sperm count in the past, will consult with Urology to repeat a Sperm Analysis.  Urine/BMs normal.  Breasts normal.  BMI 39.78.  Not exercising regularly.  Health labs Fam MD.  Past medical history,surgical history, family history and social history were all reviewed and documented in the EPIC chart.  Gynecologic History Patient's last menstrual period was 07/17/2019. Contraception: none Last Pap: 07/2018. Results were: Negative/HPV HR positive.  HPV 16-18-45 negative. Last mammogram: Never Bone Density: Never Colonoscopy: Never  Obstetric History OB History  Gravida Para Term Preterm AB Living  1 0       0  SAB TAB Ectopic Multiple Live Births               # Outcome Date GA Lbr Len/2nd Weight Sex Delivery Anes PTL Lv  1 Gravida              ROS: A ROS was performed and pertinent positives and negatives are included in the history.  GENERAL: No fevers or chills. HEENT: No change in vision, no earache, sore throat or sinus congestion. NECK: No pain or stiffness. CARDIOVASCULAR: No chest pain or pressure. No palpitations. PULMONARY: No shortness of breath, cough or wheeze. GASTROINTESTINAL: No abdominal pain, nausea, vomiting or diarrhea, melena or bright red blood per rectum. GENITOURINARY: No urinary frequency, urgency, hesitancy or dysuria. MUSCULOSKELETAL: No joint or muscle pain, no back pain, no recent trauma. DERMATOLOGIC: No rash, no itching, no lesions. ENDOCRINE: No polyuria, polydipsia, no heat or cold intolerance. No recent change in weight. HEMATOLOGICAL: No anemia or easy bruising or bleeding. NEUROLOGIC: No headache, seizures, numbness, tingling or weakness. PSYCHIATRIC: No  depression, no loss of interest in normal activity or change in sleep pattern.     Exam:   BP 118/80   Ht 5' 2.5" (1.588 m)   Wt 221 lb (100.2 kg)   LMP 07/17/2019   BMI 39.78 kg/m   Body mass index is 39.78 kg/m.  General appearance : Well developed well nourished female. No acute distress HEENT: Eyes: no retinal hemorrhage or exudates,  Neck supple, trachea midline, no carotid bruits, no thyroidmegaly Lungs: Clear to auscultation, no rhonchi or wheezes, or rib retractions  Heart: Regular rate and rhythm, no murmurs or gallops Breast:Examined in sitting and supine position were symmetrical in appearance, no palpable masses or tenderness,  no skin retraction, no nipple inversion, no nipple discharge, no skin discoloration, no axillary or supraclavicular lymphadenopathy Abdomen: no palpable masses or tenderness, no rebound or guarding Extremities: no edema or skin discoloration or tenderness  Pelvic: Vulva: Normal             Vagina: No gross lesions or discharge  Cervix: No gross lesions or discharge.  Pap/HPV HR done.  Uterus  AV, normal size, shape and consistency, non-tender and mobile  Adnexa  Without masses or tenderness  Anus: Normal  Wet prep: Clue cells present   Assessment/Plan:  38 y.o. female for annual exam   1. Encounter for routine gynecological examination with Papanicolaou smear of cervix Normal gynecologic exam.  Pap test with high-risk HPV done.  Breast exam normal.  2.  Encounter for preconception consultation Fiance will consult with Urology.  Low sperm count in the past.  Will stop using Clomid during husband's investigation and treatment as indicated.  3. Vaginal odor Bacterial vaginosis confirmed by wet prep.  Diagnosis discussed with patient and decision made to treat with tinidazole.  Usage reviewed and prescription sent to pharmacy. - WET PREP FOR TRICH, YEAST, CLUE  4. Class 2 obesity due to excess calories without serious comorbidity with body  mass index (BMI) of 39.0 to 39.9 in adult Low calorie/carb diet such as [employer].  Aerobic physical activities 5 times a week and weightlifting every 2 days.  Other orders - tinidazole (TINDAMAX) 500 MG tablet; Take 4 tablets (2,000 mg total) by mouth daily for 2 days.  Counseling on above issues and coordination of care more than 50% for 10 minutes.  [name], 12:14 PM 08/05/2019

## 2019-08-06 LAB — WET PREP FOR TRICH, YEAST, CLUE

## 2019-08-06 LAB — PAP, TP IMAGING W/ HPV RNA, RFLX HPV TYPE 16,18/45: HPV DNA High Risk: NOT DETECTED

## 2019-08-13 ENCOUNTER — Ambulatory Visit (INDEPENDENT_AMBULATORY_CARE_PROVIDER_SITE_OTHER): Payer: 59 | Admitting: Family Medicine

## 2019-08-28 ENCOUNTER — Other Ambulatory Visit: Payer: Self-pay | Admitting: Obstetrics & Gynecology

## 2019-09-05 MED ORDER — METRONIDAZOLE 500 MG PO TABS: 500.0000 mg | ORAL_TABLET | Freq: Two times a day (BID) | ORAL | 0 refills | Status: DC

## 2019-09-05 NOTE — Telephone Encounter (Signed)
ML patient. Saw ML 8/11 and diagnosed with BV.  Treated with Tindazole.

## 2019-09-05 NOTE — Telephone Encounter (Signed)
I would recommend Flagyl 500 mg twice daily x7 days.  Alcohol avoidance.  I think she needs a more extended course if she had a recurrence this fast.

## 2019-09-22 ENCOUNTER — Other Ambulatory Visit: Payer: Self-pay

## 2019-09-23 ENCOUNTER — Encounter: Payer: Self-pay | Admitting: Obstetrics & Gynecology

## 2019-09-23 ENCOUNTER — Ambulatory Visit (INDEPENDENT_AMBULATORY_CARE_PROVIDER_SITE_OTHER): Payer: 59 | Admitting: Obstetrics & Gynecology

## 2019-09-23 VITALS — BP 124/80

## 2019-09-23 DIAGNOSIS — R87610 Atypical squamous cells of undetermined significance on cytologic smear of cervix (ASC-US): Secondary | ICD-10-CM | POA: Diagnosis not present

## 2019-09-23 DIAGNOSIS — N898 Other specified noninflammatory disorders of vagina: Secondary | ICD-10-CM

## 2019-09-23 DIAGNOSIS — R87810 Cervical high risk human papillomavirus (HPV) DNA test positive: Secondary | ICD-10-CM

## 2019-09-23 MED ORDER — TINIDAZOLE 500 MG PO TABS: 2.0000 g | ORAL_TABLET | Freq: Every day | ORAL | 0 refills | Status: AC

## 2019-09-23 NOTE — Progress Notes (Signed)
    Heather Boone [birth year] [id number]        37 y.o.  G1P0A1   RP: ASCUS/HPV HR neg for Colposcopy  HPI: ASCUS/HPV HR neg 08/2019, but 07/2018 HPV HR was positive.  HPV 16-18-45 neg last year.   OB History  Gravida Para Term Preterm AB Living  1 0       0  SAB TAB Ectopic Multiple Live Births               # Outcome Date GA Lbr Len/2nd Weight Sex Delivery Anes PTL Lv  1 Gravida             Past medical history,surgical history, problem list, medications, allergies, family history and social history were all reviewed and documented in the EPIC chart.   Directed ROS with pertinent positives and negatives documented in the history of present illness/assessment and plan.  Exam:  Vitals:   09/23/19 1510  BP: 124/80   General appearance:  Normal  Colposcopy Procedure Note Heather Boone 09/23/2019  Indications:  ASCUS/HPV HR neg, but h/o HPV HR pos  Procedure Details  The risks and benefits of the procedure and Verbal informed consent obtained.  Speculum placed in vagina and excellent visualization of cervix achieved, cervix swabbed x 3 with acetic acid solution.  Findings:  Cervix colposcopy: Physical Exam Genitourinary:       Vaginal colposcopy: Normal  Vulvar colposcopy: Normal  Perirectal colposcopy: Grossly normal  The cervix was sprayed with [place] before performing the cervical biopsies.  Specimens: Cervical Bx 3 and 12 O'Clock  Complications:  None, hemostasis with Silver Nitrate . Plan:  Management per results   Assessment/Plan:  38 y.o. G1P0   1. ASCUS of cervix with negative high risk HPV ASCUS with negative high-risk HPV on Pap test September 2020.  HPV high-risk was positive in 2019.  HPV 16-18-45 were negative.  Colposcopy procedure explained.  Colposcopy findings reviewed with patient.  Management per cervical biopsy results.  Postprocedure precautions discussed.  2. Vaginal odor Increased vaginal discharge with odor  consistent with bacterial vaginosis.  Decision to treat with tinidazole 2 tablets twice a day for 2 days.  Usage reviewed and prescription sent to pharmacy.  3. Cervical high risk HPV (human papillomavirus) test positive High-risk HPV +August 2019.  Other orders - tinidazole (TINDAMAX) 500 MG tablet; Take 4 tablets (2,000 mg total) by mouth daily for 2 days.  Counseling on above issues and coordination of care more than 50% for 15 minutes.  [name], 3:36 PM 09/23/2019

## 2019-09-25 ENCOUNTER — Encounter: Payer: Self-pay | Admitting: Obstetrics & Gynecology

## 2019-09-25 NOTE — Patient Instructions (Signed)
1. ASCUS of cervix with negative high risk HPV ASCUS with negative high-risk HPV on Pap test September 2020.  HPV high-risk was positive in 2019.  HPV 16-18-45 were negative.  Colposcopy procedure explained.  Colposcopy findings reviewed with patient.  Management per cervical biopsy results.  Postprocedure precautions discussed.  2. Vaginal odor Increased vaginal discharge with odor consistent with bacterial vaginosis.  Decision to treat with tinidazole 2 tablets twice a day for 2 days.  Usage reviewed and prescription sent to pharmacy.  3. Cervical high risk HPV (human papillomavirus) test positive High-risk HPV +August 2019.  Other orders - tinidazole (TINDAMAX) 500 MG tablet; Take 4 tablets (2,000 mg total) by mouth daily for 2 days.  Inez, it was a pleasure seeing you today!  I will inform you of your results as soon as they are available.

## 2019-09-26 LAB — TISSUE PATH REPORT

## 2019-09-26 LAB — PATHOLOGY REPORT

## 2019-09-29 ENCOUNTER — Other Ambulatory Visit: Payer: Self-pay

## 2019-09-29 ENCOUNTER — Encounter (INDEPENDENT_AMBULATORY_CARE_PROVIDER_SITE_OTHER): Payer: Self-pay | Admitting: Family Medicine

## 2019-09-29 ENCOUNTER — Ambulatory Visit (INDEPENDENT_AMBULATORY_CARE_PROVIDER_SITE_OTHER): Payer: 59 | Admitting: Family Medicine

## 2019-09-29 VITALS — BP 116/71 | HR 80 | Temp 98.4°F | Ht 63.0 in | Wt 219.0 lb

## 2019-09-29 DIAGNOSIS — Z9189 Other specified personal risk factors, not elsewhere classified: Secondary | ICD-10-CM | POA: Diagnosis not present

## 2019-09-29 DIAGNOSIS — F3289 Other specified depressive episodes: Secondary | ICD-10-CM

## 2019-09-29 DIAGNOSIS — Z6838 Body mass index (BMI) 38.0-38.9, adult: Secondary | ICD-10-CM

## 2019-09-29 DIAGNOSIS — E88819 Insulin resistance, unspecified: Secondary | ICD-10-CM

## 2019-09-29 DIAGNOSIS — R5383 Other fatigue: Secondary | ICD-10-CM

## 2019-09-29 DIAGNOSIS — E8881 Metabolic syndrome: Secondary | ICD-10-CM

## 2019-09-29 DIAGNOSIS — E559 Vitamin D deficiency, unspecified: Secondary | ICD-10-CM | POA: Diagnosis not present

## 2019-09-29 MED ORDER — BUPROPION HCL ER (SR) 200 MG PO TB12: 200.0000 mg | ORAL_TABLET | Freq: Two times a day (BID) | ORAL | 0 refills | Status: DC

## 2019-09-29 MED ORDER — METFORMIN HCL 500 MG PO TABS: 500.0000 mg | ORAL_TABLET | Freq: Three times a day (TID) | ORAL | 0 refills | Status: DC

## 2019-09-29 MED ORDER — VITAMIN D (ERGOCALCIFEROL) 1.25 MG (50000 UNIT) PO CAPS: 50000.0000 [IU] | ORAL_CAPSULE | ORAL | 0 refills | Status: DC

## 2019-09-30 LAB — LIPID PANEL WITH LDL/HDL RATIO
Cholesterol, Total: 146 mg/dL (ref 100–199)
HDL: 65 mg/dL (ref >39)
LDL Chol Calc (NIH): 73 mg/dL (ref 0–99)
LDL/HDL Ratio: 1.1 ratio (ref 0.0–3.2)
Triglycerides: 32 mg/dL (ref 0–149)
VLDL Cholesterol Cal: 8 mg/dL (ref 5–40)

## 2019-09-30 LAB — COMPREHENSIVE METABOLIC PANEL
ALT: 8 IU/L (ref 0–32)
AST: 16 IU/L (ref 0–40)
Albumin/Globulin Ratio: 1.3 (ref 1.2–2.2)
Albumin: 3.8 g/dL (ref 3.8–4.8)
Alkaline Phosphatase: 94 IU/L (ref 39–117)
BUN/Creatinine Ratio: 23 (ref 9–23)
BUN: 15 mg/dL (ref 6–20)
Bilirubin Total: <0.2 mg/dL (ref 0.0–1.2)
CO2: 22 mmol/L (ref 20–29)
Calcium: 9 mg/dL (ref 8.7–10.2)
Chloride: 107 mmol/L — ABNORMAL HIGH (ref 96–106)
Creatinine, Ser: 0.65 mg/dL (ref 0.57–1.00)
GFR calc Af Amer: 133 mL/min/{1.73_m2} (ref >59)
GFR calc non Af Amer: 115 mL/min/{1.73_m2} (ref >59)
Globulin, Total: 2.9 g/dL (ref 1.5–4.5)
Glucose: 86 mg/dL (ref 65–99)
Potassium: 4.4 mmol/L (ref 3.5–5.2)
Sodium: 142 mmol/L (ref 134–144)
Total Protein: 6.7 g/dL (ref 6.0–8.5)

## 2019-09-30 LAB — VITAMIN D 25 HYDROXY (VIT D DEFICIENCY, FRACTURES): Vit D, 25-Hydroxy: 20.5 ng/mL — ABNORMAL LOW (ref 30.0–100.0)

## 2019-09-30 LAB — TSH: TSH: 2.57 u[IU]/mL (ref 0.450–4.500)

## 2019-09-30 LAB — T4, FREE: Free T4: 1.01 ng/dL (ref 0.82–1.77)

## 2019-09-30 LAB — HEMOGLOBIN A1C
Est. average glucose Bld gHb Est-mCnc: 103 mg/dL
Hgb A1c MFr Bld: 5.2 % (ref 4.8–5.6)

## 2019-09-30 LAB — INSULIN, RANDOM: INSULIN: 13.5 u[IU]/mL (ref 2.6–24.9)

## 2019-09-30 LAB — T3: T3, Total: 114 ng/dL (ref 71–180)

## 2019-09-30 NOTE — Progress Notes (Signed)
Office: [phone]  /  Fax: [phone]   HPI:   Chief Complaint: OBESITY Heather Boone is here to discuss her progress with her obesity treatment plan. She is on the keep a food journal with 1200 calories and 25 grams of protein daily plan and she is following her eating plan approximately 25 % of the time. She states she is walking for 45 minutes 2 times per week. Sayumi has been very busy with school and work. She is in nursing school and working full-time. She has not had time to focus on her meal plan. Her weight is 219 lb (99.3 kg) today and has had a weight loss of 5 pounds since her last in-office visit. She has lost 12 lbs since starting treatment with [place].  Other Fatigue Heather Boone reports fatigue. 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  Insulin Resistance Heather Boone has a diagnosis of insulin resistance based on her elevated fasting insulin level >5. Although Heather Boone's blood glucose readings are still under good control, insulin resistance puts her at greater risk of metabolic syndrome and diabetes. She is taking metformin currently and continues to work on diet and exercise to decrease risk of diabetes. Kela admits to polyphagia between meals.  Vitamin D deficiency Heather Boone has a diagnosis of vitamin D deficiency. She is currently taking vitamin D. Her last vitamin D level was at 25 on 12/12/18 and was not at goal. Heather Boone admits fatigue and she denies nausea, vomiting or muscle weakness.  At risk for osteopenia and osteoporosis Heather Boone is at higher risk of osteopenia and osteoporosis due to vitamin D deficiency.   Depression with emotional eating behaviors Heather Boone admits to stress eating. 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 Heather Boone struggles with emotional eating and using food for comfort to the extent that it is negatively impacting her health. She often snacks when she is not hungry. Heather Boone sometimes feels she is  out of control and then feels guilty that she made poor food choices. She has been working on behavior modification techniques to help reduce her emotional eating and has been somewhat successful. She shows no sign of suicidal or homicidal ideations.  ASSESSMENT AND PLAN:  Other fatigue - Plan: Comprehensive metabolic panel, Lipid Panel With LDL/HDL Ratio, T3, T4, free, TSH  Vitamin D deficiency - Plan: Vitamin D, Ergocalciferol, (DRISDOL) 1.25 MG (50000 UT) CAPS capsule, VITAMIN D 25 Hydroxy (Vit-D Deficiency, Fractures)  Insulin resistance - Plan: metFORMIN (GLUCOPHAGE) 500 MG tablet, Hemoglobin A1c, Insulin, random  Other depression - with emotional eating - Plan: buPROPion (WELLBUTRIN SR) 200 MG 12 hr tablet  At risk for osteoporosis  Class 2 severe obesity with serious comorbidity and body mass index (BMI) of 38.0 to 38.9 in adult, unspecified obesity type (HCC)  PLAN:  Other Fatigue Labs will be ordered (thyroid panel, lipid panel) and in the meanwhile Ceazia will continue to work on diet, exercise and weight loss to help with fatigue.   Insulin Resistance Kitiara will continue to work on weight loss, exercise, and decreasing simple carbohydrates in her diet to help decrease the risk of diabetes. We dicussed metformin including benefits and risks. Heather Boone agrees to continue metformin 500 mg TID #90 with no refills and follow up with [place] as directed to monitor her progress.  Vitamin D Deficiency Heather Boone was informed that low vitamin D levels contributes to fatigue and are associated with obesity, breast, and colon cancer. Heather Boone agrees to continue to take prescription Vit  D @50 ,000 IU every 3 days #10 with no refills and she will follow up for routine testing of vitamin D, at least 2-3 times per year. She was informed of the risk of over-replacement of vitamin D and agrees to not increase her dose unless she discusses this with [place] first. Heather Boone agrees to follow up with our  clinic in 2 weeks.  At risk for osteopenia and osteoporosis Heather Boone was given extended  (15 minutes) osteoporosis prevention counseling today. Heather Boone is at risk for osteopenia and osteoporosis due to her vitamin D deficiency. She was encouraged to take her vitamin D and follow her higher calcium diet and increase strengthening exercise to help strengthen her bones and decrease her risk of osteopenia and osteoporosis.  Depression with Emotional Eating Behaviors We discussed behavior modification techniques today to help Avilene deal with her emotional eating and depression. She has agreed to continue Bupropion SR 200 mg BID #60 with no refills and follow up as directed.  Obesity Heather Boone is currently in the action stage of change. As such, her goal is to continue with weight loss efforts She has agreed to keep a food journal with 1200 to 1500 calories and 80 to 90 grams of protein daily Heather Boone will continue walking 45 minutes 2 times per week for weight loss and overall health benefits. We discussed the following Behavioral Modification Strategies today: planning for success, better snacking choices, increasing lean protein intake, decreasing simple carbohydrates, increasing vegetables, decrease eating out and work on meal planning and easy cooking plans  Handouts for [employer] and journaling were sent to patient via [place].  Heather Boone has agreed to follow up with our clinic in 2 weeks. She was informed of the importance of frequent follow up visits to maximize her success with intensive lifestyle modifications for her multiple health conditions.  ALLERGIES: Allergies  Allergen Reactions   Aleve [Naproxen Sodium]    Motrin [Ibuprofen]    Oxycodone-Acetaminophen    Tramadol     MEDICATIONS: Current Outpatient Medications on File Prior to Visit  Medication Sig Dispense Refill   clomiPHENE (CLOMID) 50 MG tablet TAKE ONE TABLET DAILY ON DAYS 3-7 OF CYCLE. 5 tablet 0    Prenatal Vit-Fe Fumarate-FA (PNV PRENATAL PLUS MULTIVITAMIN) 27-1 MG TABS Take 1 tablet by mouth daily. 90 tablet 4   No current facility-administered medications on file prior to visit.     PAST MEDICAL HISTORY: Past Medical History:  Diagnosis Date   Back pain    Constipation    Heartburn    Obesity     PAST SURGICAL HISTORY: Past Surgical History:  Procedure Laterality Date   LAPAROSCOPIC GASTRIC BANDING     dec 2011    SOCIAL HISTORY: Social History   Tobacco Use   Smoking status: Never Smoker   Smokeless tobacco: Never Used  Substance Use Topics   Alcohol use: No   Drug use: No    FAMILY HISTORY: Family History  Problem Relation Age of Onset   Hyperlipidemia Mother    Hyperlipidemia Father    Heart disease Father    Cancer Father    Obesity Father     ROS: Review of Systems  Constitutional: Positive for malaise/fatigue. Negative for weight loss.  Gastrointestinal: Negative for nausea and vomiting.  Musculoskeletal:       Negative for muscle weakness  Endo/Heme/Allergies:       Positive for polyphagia  Psychiatric/Behavioral: Positive for depression. Negative for suicidal ideas.       Positive for  stress    PHYSICAL EXAM: Blood pressure 116/71, pulse 80, temperature 98.4 F (36.9 C), temperature source Oral, height 5\' 3"  (1.6 m), weight 219 lb (99.3 kg), last menstrual period 09/14/2019, SpO2 99 %. Body mass index is 38.79 kg/m. Physical Exam Vitals signs reviewed.  Constitutional:      Appearance: Normal appearance. She is well-developed. She is obese.  Cardiovascular:     Rate and Rhythm: Normal rate.  Pulmonary:     Effort: Pulmonary effort is normal.  Musculoskeletal: Normal range of motion.  Skin:    General: Skin is warm and dry.  Neurological:     Mental Status: She is alert and oriented to person, place, and time.  Psychiatric:        Mood and Affect: Mood normal.        Behavior: Behavior normal.        Thought  Content: Thought content does not include homicidal or suicidal ideation.     RECENT LABS AND TESTS: BMET    Component Value Date/Time   NA 142 09/29/2019 0900   K 4.4 09/29/2019 0900   CL 107 (H) 09/29/2019 0900   CO2 22 09/29/2019 0900   GLUCOSE 86 09/29/2019 0900   GLUCOSE 93 01/13/2010 0220   BUN 15 09/29/2019 0900   CREATININE 0.65 09/29/2019 0900   CALCIUM 9.0 09/29/2019 0900   GFRNONAA 115 09/29/2019 0900   GFRAA 133 09/29/2019 0900   Lab Results  Component Value Date   HGBA1C 5.2 09/29/2019   HGBA1C 5.3 12/04/2018   HGBA1C 5.2 03/04/2018   HGBA1C 5.1 11/01/2017   HGBA1C 5.5 07/13/2017   Lab Results  Component Value Date   INSULIN 13.5 09/29/2019   INSULIN 14.4 12/04/2018   INSULIN 8.5 03/04/2018   INSULIN 14.2 11/01/2017   INSULIN 11.0 07/13/2017   CBC    Component Value Date/Time   WBC 6.8 03/04/2018 0740   WBC 12.2 (H) 01/13/2010 0205   RBC 4.44 03/04/2018 0740   RBC 5.05 01/13/2010 0205   HGB 11.7 03/04/2018 0740   HCT 37.1 03/04/2018 0740   PLT 383 01/13/2010 0205   MCV 84 03/04/2018 0740   MCH 26.4 (L) 03/04/2018 0740   MCHC 31.5 03/04/2018 0740   MCHC 33.9 01/13/2010 0205   RDW 14.6 03/04/2018 0740   LYMPHSABS 2.4 03/04/2018 0740   MONOABS 0.7 01/13/2010 0205   EOSABS 0.1 03/04/2018 0740   BASOSABS 0.0 03/04/2018 0740   Iron/TIBC/Ferritin/ %Sat No results found for: IRON, TIBC, FERRITIN, IRONPCTSAT Lipid Panel     Component Value Date/Time   CHOL 146 09/29/2019 0900   TRIG 32 09/29/2019 0900   HDL 65 09/29/2019 0900   LDLCALC 73 09/29/2019 0900   Hepatic Function Panel     Component Value Date/Time   PROT 6.7 09/29/2019 0900   ALBUMIN 3.8 09/29/2019 0900   AST 16 09/29/2019 0900   ALT 8 09/29/2019 0900   ALKPHOS 94 09/29/2019 0900   BILITOT <0.2 09/29/2019 0900   BILIDIR 0.2 01/13/2010 0205   IBILI 0.8 01/13/2010 0205      Component Value Date/Time   TSH 2.570 09/29/2019 0900   TSH 4.090 03/04/2018 0740   TSH 1.650  07/13/2017 1030     Ref. Range 12/04/2018 09:28  Vitamin D, 25-Hydroxy Latest Ref Range: 30.0 - 100.0 ng/mL 25.0 (L)    OBESITY BEHAVIORAL INTERVENTION VISIT  Today's visit was # 30  Starting weight: 231 lbs Starting date: 07/13/2017 Today's weight : 219 lbs Today's date: 09/29/2019  Total lbs lost to date: 12    09/29/2019  Height 5\' 3"  (1.6 m)  Weight 219 lb (99.3 kg)  BMI (Calculated) 38.8  BLOOD PRESSURE - SYSTOLIC 99991111  BLOOD PRESSURE - DIASTOLIC 71   Body Fat % 123456 %  Total Body Water (lbs) 80 lbs    ASK: We discussed the diagnosis of obesity with Heather Boone today and Heather Boone agreed to give [place] permission to discuss obesity behavioral modification therapy today.  ASSESS: Heather Boone has the diagnosis of obesity and her BMI today is 38.8 Heather Boone is in the action stage of change   ADVISE: Heather Boone was educated on the multiple health risks of obesity as well as the benefit of weight loss to improve her health. She was advised of the need for long term treatment and the importance of lifestyle modifications to improve her current health and to decrease her risk of future health problems.  AGREE: Multiple dietary modification options and treatment options were discussed and  Heather Boone agreed to follow the recommendations documented in the above note.  ARRANGE: Heather Boone was educated on the importance of frequent visits to treat obesity as outlined per CMS and USPSTF guidelines and agreed to schedule her next follow up appointment today.  I, Doreene Nest, am acting as transcriptionist for [employer], FNP-C  I have reviewed the above documentation for accuracy and completeness, and I agree with the above.  - [place] Hanlon, FNP-C.

## 2019-10-01 ENCOUNTER — Encounter (INDEPENDENT_AMBULATORY_CARE_PROVIDER_SITE_OTHER): Payer: Self-pay | Admitting: Family Medicine

## 2019-10-01 DIAGNOSIS — R5383 Other fatigue: Secondary | ICD-10-CM | POA: Insufficient documentation

## 2019-10-13 ENCOUNTER — Ambulatory Visit (INDEPENDENT_AMBULATORY_CARE_PROVIDER_SITE_OTHER): Payer: 59 | Admitting: Family Medicine

## 2019-10-13 ENCOUNTER — Encounter (INDEPENDENT_AMBULATORY_CARE_PROVIDER_SITE_OTHER): Payer: Self-pay | Admitting: Family Medicine

## 2019-10-13 ENCOUNTER — Other Ambulatory Visit: Payer: Self-pay

## 2019-10-13 VITALS — BP 118/74 | HR 75 | Temp 98.8°F | Ht 63.0 in | Wt 223.0 lb

## 2019-10-13 DIAGNOSIS — E559 Vitamin D deficiency, unspecified: Secondary | ICD-10-CM

## 2019-10-13 DIAGNOSIS — Z6839 Body mass index (BMI) 39.0-39.9, adult: Secondary | ICD-10-CM

## 2019-10-13 DIAGNOSIS — Z9189 Other specified personal risk factors, not elsewhere classified: Secondary | ICD-10-CM | POA: Diagnosis not present

## 2019-10-13 DIAGNOSIS — E8881 Metabolic syndrome: Secondary | ICD-10-CM | POA: Diagnosis not present

## 2019-10-13 MED ORDER — VITAMIN D (ERGOCALCIFEROL) 1.25 MG (50000 UNIT) PO CAPS: 50000.0000 [IU] | ORAL_CAPSULE | ORAL | 0 refills | Status: DC

## 2019-10-15 NOTE — Progress Notes (Signed)
Office: [phone]  /  Fax: [phone]   HPI:   Chief Complaint: OBESITY Heather Boone is here to discuss her progress with her obesity treatment plan. She is on the keep a food journal with 1200 to 1800 calories and 75 grams of protein daily plan and she is following her eating plan approximately 75 % of the time. She states she is walking 45 minutes 2 times per week. Heather Boone is journaling consistently. She tends to eat too many calories on the weekends. She does not skip meals. Heather Boone tends to eat out sometimes when she is tired from work. Her weight is 223 lb (101.2 kg) today and has had a weight gain of 4 pounds over a period of 2 weeks since her last visit. She has lost 8 lbs since starting treatment with [place].  Insulin Resistance Heather Boone has a diagnosis of insulin resistance based on her elevated fasting insulin level >5. Although Heather Boone's blood glucose readings are still under good control, insulin resistance puts her at greater risk of metabolic syndrome and diabetes. Heather Boone is on metformin 3 times daily. She continues to work on diet and exercise to decrease risk of diabetes. Heather Boone denies polyphagia.  Vitamin D deficiency Heather Boone has a diagnosis of vitamin D deficiency. She is currently taking vit D. Her last vitamin D level was low at 20.5 and she is not at goal. Heather Boone denies nausea, vomiting or muscle weakness.  At risk for osteopenia and osteoporosis Heather Boone is at higher risk of osteopenia and osteoporosis due to vitamin D deficiency.   ASSESSMENT AND PLAN:  Insulin resistance  Vitamin D deficiency - Plan: Vitamin D, Ergocalciferol, (DRISDOL) 1.25 MG (50000 UT) CAPS capsule  At risk for osteoporosis  Class 2 severe obesity with serious comorbidity and body mass index (BMI) of 39.0 to 39.9 in adult, unspecified obesity type (Heather Boone)  PLAN:  Insulin Resistance Heather Boone will continue to work on weight loss, exercise, and decreasing simple carbohydrates in her  diet to help decrease the risk of diabetes. We dicussed metformin including benefits and risks.  Heather Boone agrees to continue metformin 500 mg three times daily #90 with no refills and follow up with [place] as directed to monitor her progress.  Vitamin D Deficiency Heather Boone was informed that low vitamin D levels contributes to fatigue and are associated with obesity, breast, and colon cancer. Heather Boone agrees to continue to take prescription Vit D @50 ,000 IU every 3 days #10 with no refills and she will follow up for routine testing of vitamin D, at least 2-3 times per year. She was informed of the risk of over-replacement of vitamin D and agrees to not increase her dose unless she discusses this with [place] first. Heather Boone agrees to follow up with our clinic in 2 to 3 weeks.  At risk for osteopenia and osteoporosis Heather Boone was given extended  (15 minutes) osteoporosis prevention counseling today. Heather Boone is at risk for osteopenia and osteoporosis due to her vitamin D deficiency. She was encouraged to take her vitamin D and follow her higher calcium diet and increase strengthening exercise to help strengthen her bones and decrease her risk of osteopenia and osteoporosis.  Obesity Heather Boone is currently in the action stage of change. As such, her goal is to continue with weight loss efforts She has agreed to keep a food journal with 1200 to 1600 calories and 75 grams of protein daily Dotsy will continue walking 45 minutes, 2 times per week for weight loss and overall health benefits. We discussed the following  Behavioral Modification Strategies today: planning for success, keep a strict food journal, increasing lean protein intake, decreasing simple carbohydrates, decrease eating out and work on meal planning and easy cooking plans  Heather Boone has agreed to follow up with our clinic in 2 to 3 weeks. She was informed of the importance of frequent follow up visits to maximize her success with intensive  lifestyle modifications for her multiple health conditions.  ALLERGIES: Allergies  Allergen Reactions  . Aleve [Naproxen Sodium]   . Motrin [Ibuprofen]   . Oxycodone-Acetaminophen   . Tramadol     MEDICATIONS: Current Outpatient Medications on File Prior to Visit  Medication Sig Dispense Refill  . buPROPion (WELLBUTRIN SR) 200 MG 12 hr tablet Take 1 tablet (200 mg total) by mouth 2 (two) times daily. 60 tablet 0  . clomiPHENE (CLOMID) 50 MG tablet TAKE ONE TABLET DAILY ON DAYS 3-7 OF CYCLE. 5 tablet 0  . metFORMIN (GLUCOPHAGE) 500 MG tablet Take 1 tablet (500 mg total) by mouth 3 (three) times daily. 90 tablet 0  . Prenatal Vit-Fe Fumarate-FA (PNV PRENATAL PLUS MULTIVITAMIN) 27-1 MG TABS Take 1 tablet by mouth daily. 90 tablet 4   No current facility-administered medications on file prior to visit.     PAST MEDICAL HISTORY: Past Medical History:  Diagnosis Date  . Back pain   . Constipation   . Heartburn   . Obesity     PAST SURGICAL HISTORY: Past Surgical History:  Procedure Laterality Date  . LAPAROSCOPIC GASTRIC BANDING     dec 2011    SOCIAL HISTORY: Social History   Tobacco Use  . Smoking status: Never Smoker  . Smokeless tobacco: Never Used  Substance Use Topics  . Alcohol use: No  . Drug use: No    FAMILY HISTORY: Family History  Problem Relation Age of Onset  . Hyperlipidemia Mother   . Hyperlipidemia Father   . Heart disease Father   . Cancer Father   . Obesity Father     ROS: Review of Systems  Constitutional: Negative for weight loss.  Gastrointestinal: Negative for nausea and vomiting.  Musculoskeletal:       Negative for muscle weakness  Endo/Heme/Allergies:       Negative for polyphagia    PHYSICAL EXAM: Blood pressure 118/74, pulse 75, temperature 98.8 F (37.1 C), temperature source Oral, height 5\' 3"  (1.6 m), weight 223 lb (101.2 kg), last menstrual period 09/14/2019, SpO2 99 %. Body mass index is 39.5 kg/m. Physical Exam  Vitals signs reviewed.  Constitutional:      Appearance: Normal appearance. She is well-developed. She is obese.  Cardiovascular:     Rate and Rhythm: Normal rate.  Pulmonary:     Effort: Pulmonary effort is normal.  Musculoskeletal: Normal range of motion.  Skin:    General: Skin is warm and dry.  Neurological:     Mental Status: She is alert and oriented to person, place, and time.  Psychiatric:        Mood and Affect: Mood normal.        Behavior: Behavior normal.     RECENT LABS AND TESTS: BMET    Component Value Date/Time   NA 142 09/29/2019 0900   K 4.4 09/29/2019 0900   CL 107 (H) 09/29/2019 0900   CO2 22 09/29/2019 0900   GLUCOSE 86 09/29/2019 0900   GLUCOSE 93 01/13/2010 0220   BUN 15 09/29/2019 0900   CREATININE 0.65 09/29/2019 0900   CALCIUM 9.0 09/29/2019 0900  GFRNONAA 115 09/29/2019 0900   GFRAA 133 09/29/2019 0900   Lab Results  Component Value Date   HGBA1C 5.2 09/29/2019   HGBA1C 5.3 12/04/2018   HGBA1C 5.2 03/04/2018   HGBA1C 5.1 11/01/2017   HGBA1C 5.5 07/13/2017   Lab Results  Component Value Date   INSULIN 13.5 09/29/2019   INSULIN 14.4 12/04/2018   INSULIN 8.5 03/04/2018   INSULIN 14.2 11/01/2017   INSULIN 11.0 07/13/2017   CBC    Component Value Date/Time   WBC 6.8 03/04/2018 0740   WBC 12.2 (H) 01/13/2010 0205   RBC 4.44 03/04/2018 0740   RBC 5.05 01/13/2010 0205   HGB 11.7 03/04/2018 0740   HCT 37.1 03/04/2018 0740   PLT 383 01/13/2010 0205   MCV 84 03/04/2018 0740   MCH 26.4 (L) 03/04/2018 0740   MCHC 31.5 03/04/2018 0740   MCHC 33.9 01/13/2010 0205   RDW 14.6 03/04/2018 0740   LYMPHSABS 2.4 03/04/2018 0740   MONOABS 0.7 01/13/2010 0205   EOSABS 0.1 03/04/2018 0740   BASOSABS 0.0 03/04/2018 0740   Iron/TIBC/Ferritin/ %Sat No results found for: IRON, TIBC, FERRITIN, IRONPCTSAT Lipid Panel     Component Value Date/Time   CHOL 146 09/29/2019 0900   TRIG 32 09/29/2019 0900   HDL 65 09/29/2019 0900   LDLCALC 73  09/29/2019 0900   Hepatic Function Panel     Component Value Date/Time   PROT 6.7 09/29/2019 0900   ALBUMIN 3.8 09/29/2019 0900   AST 16 09/29/2019 0900   ALT 8 09/29/2019 0900   ALKPHOS 94 09/29/2019 0900   BILITOT <0.2 09/29/2019 0900   BILIDIR 0.2 01/13/2010 0205   IBILI 0.8 01/13/2010 0205      Component Value Date/Time   TSH 2.570 09/29/2019 0900   TSH 4.090 03/04/2018 0740   TSH 1.650 07/13/2017 1030     Ref. Range 09/29/2019 09:00  Vitamin D, 25-Hydroxy Latest Ref Range: 30.0 - 100.0 ng/mL 20.5 (L)    OBESITY BEHAVIORAL INTERVENTION VISIT  Today's visit was # 31  Starting weight: 231 lbs Starting date: 07/13/2017 Today's weight : 223 lbs Today's date: 10/13/2019 Total lbs lost to date: 8    10/13/2019  Height 5\' 3"  (1.6 m)  Weight 223 lb (101.2 kg)  BMI (Calculated) 39.51  BLOOD PRESSURE - SYSTOLIC 123456  BLOOD PRESSURE - DIASTOLIC 74   Body Fat % [id number] %  Total Body Water (lbs) 81.4 lbs    ASK: We discussed the diagnosis of obesity with Tacy Learn today and Dianny agreed to give [place] permission to discuss obesity behavioral modification therapy today.  ASSESS: Gennie has the diagnosis of obesity and her BMI today is 39.51 Corea is in the action stage of change   ADVISE: Maryellen was educated on the multiple health risks of obesity as well as the benefit of weight loss to improve her health. She was advised of the need for long term treatment and the importance of lifestyle modifications to improve her current health and to decrease her risk of future health problems.  AGREE: Multiple dietary modification options and treatment options were discussed and  Eleftheria agreed to follow the recommendations documented in the above note.  ARRANGE: Elmina was educated on the importance of frequent visits to treat obesity as outlined per CMS and USPSTF guidelines and agreed to schedule her next follow up appointment today.  Corey Skains, am acting  as Location manager for [employer], FNP-C.  I have reviewed the above documentation for accuracy  and completeness, and I agree with the above.  -  , FNP-C.

## 2019-10-16 ENCOUNTER — Encounter (INDEPENDENT_AMBULATORY_CARE_PROVIDER_SITE_OTHER): Payer: Self-pay | Admitting: Family Medicine

## 2019-11-03 ENCOUNTER — Ambulatory Visit (INDEPENDENT_AMBULATORY_CARE_PROVIDER_SITE_OTHER): Payer: 59 | Admitting: Family Medicine

## 2019-11-03 ENCOUNTER — Other Ambulatory Visit: Payer: Self-pay

## 2019-11-03 ENCOUNTER — Encounter (INDEPENDENT_AMBULATORY_CARE_PROVIDER_SITE_OTHER): Payer: Self-pay | Admitting: Family Medicine

## 2019-11-03 VITALS — BP 108/63 | HR 70 | Temp 98.8°F | Ht 63.0 in | Wt 226.0 lb

## 2019-11-03 DIAGNOSIS — E8881 Metabolic syndrome: Secondary | ICD-10-CM

## 2019-11-03 DIAGNOSIS — Z6841 Body Mass Index (BMI) 40.0 and over, adult: Secondary | ICD-10-CM

## 2019-11-03 DIAGNOSIS — Z9189 Other specified personal risk factors, not elsewhere classified: Secondary | ICD-10-CM | POA: Diagnosis not present

## 2019-11-03 DIAGNOSIS — E559 Vitamin D deficiency, unspecified: Secondary | ICD-10-CM | POA: Diagnosis not present

## 2019-11-03 MED ORDER — VITAMIN D (ERGOCALCIFEROL) 1.25 MG (50000 UNIT) PO CAPS: 50000.0000 [IU] | ORAL_CAPSULE | ORAL | 0 refills | Status: DC

## 2019-11-04 ENCOUNTER — Encounter (INDEPENDENT_AMBULATORY_CARE_PROVIDER_SITE_OTHER): Payer: Self-pay | Admitting: Family Medicine

## 2019-11-04 NOTE — Progress Notes (Signed)
Office: [phone]  /  Fax: [phone]   HPI:   Chief Complaint: OBESITY Heather Boone is here to discuss her progress with her obesity treatment plan. She is on the keep a food journal with 1200 to 1500 calories and 75 grams of protein daily plan and she is following her eating plan approximately 89 % of the time. She states she is walking 30 minutes 2 times per week. Heather Boone is journaling consistently and she is keeping calories around 1200 to 1300 daily. She is getting 75 grams of protein daily. Heather Boone is up 3 pounds of water weight today. Her weight is 226 lb (102.5 kg) today and has had a weight gain of 3 pounds over a period of 3 weeks since her last visit. She has lost 5 lbs since starting treatment with Heather Boone.  Vitamin D deficiency Heather Boone has a diagnosis of vitamin D deficiency. Heather Boone is not at goal. She is consistently low on vitamin D. Heather Boone has been on vitamin D twice weekly for quite a while. She denies nausea, vomiting or muscle weakness.  At risk for osteopenia and osteoporosis Heather Boone is at higher risk of osteopenia and osteoporosis due to vitamin D deficiency.   Insulin Resistance Heather Boone has a diagnosis of insulin resistance based on her elevated fasting insulin level >5. Although Heather Boone's blood glucose readings are still under good control, insulin resistance puts her at greater risk of metabolic syndrome and diabetes. She is on metformin three times a day and she denies diarrhea or nausea. Heather Boone continues to work on diet and exercise to decrease risk of diabetes. Heather Boone denies polyphagia. Lab Results  Component Value Date   HGBA1C 5.2 09/29/2019    ASSESSMENT AND PLAN:  Vitamin D deficiency - Plan: Vitamin D, Ergocalciferol, (DRISDOL) 1.25 MG (50000 UT) CAPS capsule  Insulin resistance  At risk for osteoporosis  Class 3 severe obesity with serious comorbidity and body mass index (BMI) of 40.0 to 44.9 in adult, unspecified obesity type ([place])   PLAN:  Vitamin D Deficiency Heather Boone was informed that low vitamin D levels contributes to fatigue and are associated with obesity, breast, and colon cancer. [name] agrees to continue prescription Vit D @50 ,000 IU every 3 days #10 with no refills and she will follow up for routine testing of vitamin D, at least 2-3 times per year. She was informed of the risk of over-replacement of vitamin D and agrees to not increase her dose unless she discusses this with Heather Boone first. We will recheck vitamin D in 2 months. Heather Boone agrees to follow up with our clinic in 3 weeks.  At risk for osteopenia and osteoporosis Heather Boone was given extended  (15 minutes) osteoporosis prevention counseling today. Heather Boone is at risk for osteopenia and osteoporosis due to her vitamin D deficiency. She was encouraged to take her vitamin D and follow her higher calcium diet and increase strengthening exercise to help strengthen her bones and decrease her risk of osteopenia and osteoporosis.  Insulin Resistance Heather Boone will continue to work on weight loss, exercise, and decreasing simple carbohydrates in her diet to help decrease the risk of diabetes. She was informed that eating too many simple carbohydrates or too many calories at one sitting increases the likelihood of GI side effects. Heather Boone will continue metformin and she will follow up with Heather Boone as directed to monitor her progress.  Obesity Heather Boone is currently in the action stage of change. As such, her goal is to continue with weight loss efforts She has agreed to keep a  food journal with 1200 to 1500 calories and 75 grams of protein daily Heather Boone will continue walking for 30 minutes, 2 times per week for weight loss and overall health benefits. We discussed the following Behavioral Modification Strategies today: planning for success, keep a strict food journal and decreasing sodium intake  Heather Boone has agreed to follow up with our clinic in 3 weeks. She was informed  of the importance of frequent follow up visits to maximize her success with intensive lifestyle modifications for her multiple health conditions.  ALLERGIES: Allergies  Allergen Reactions  . Aleve [Naproxen Sodium]   . Motrin [Ibuprofen]   . Oxycodone-Acetaminophen   . Tramadol     MEDICATIONS: Current Outpatient Medications on File Prior to Visit  Medication Sig Dispense Refill  . buPROPion (WELLBUTRIN SR) 200 MG 12 hr tablet Take 1 tablet (200 mg total) by mouth 2 (two) times daily. 60 tablet 0  . clomiPHENE (CLOMID) 50 MG tablet TAKE ONE TABLET DAILY ON DAYS 3-7 OF CYCLE. 5 tablet 0  . metFORMIN (GLUCOPHAGE) 500 MG tablet Take 1 tablet (500 mg total) by mouth 3 (three) times daily. 90 tablet 0  . Prenatal Vit-Fe Fumarate-FA (PNV PRENATAL PLUS MULTIVITAMIN) 27-1 MG TABS Take 1 tablet by mouth daily. 90 tablet 4   No current facility-administered medications on file prior to visit.     PAST MEDICAL HISTORY: Past Medical History:  Diagnosis Date  . Back pain   . Constipation   . Heartburn   . Obesity     PAST SURGICAL HISTORY: Past Surgical History:  Procedure Laterality Date  . LAPAROSCOPIC GASTRIC BANDING     dec 2011    SOCIAL HISTORY: Social History   Tobacco Use  . Smoking status: Never Smoker  . Smokeless tobacco: Never Used  Substance Use Topics  . Alcohol use: No  . Drug use: No    FAMILY HISTORY: Family History  Problem Relation Age of Onset  . Hyperlipidemia Mother   . Hyperlipidemia Father   . Heart disease Father   . Cancer Father   . Obesity Father     ROS: Review of Systems  Constitutional: Negative for weight loss.  Gastrointestinal: Negative for diarrhea, nausea and vomiting.  Musculoskeletal:       Negative for muscle weakness  Endo/Heme/Allergies:       Negative for polyphagia    PHYSICAL EXAM: Blood pressure 108/63, pulse 70, temperature 98.8 F (37.1 C), temperature source Oral, height 5\' 3"  (1.6 m), weight 226 lb (102.5 kg),  last menstrual period 10/29/2019, SpO2 99 %. Body mass index is 40.03 kg/m. Physical Exam Vitals signs reviewed.  Constitutional:      Appearance: Normal appearance. She is well-developed. She is obese.  Cardiovascular:     Rate and Rhythm: Normal rate.  Pulmonary:     Effort: Pulmonary effort is normal.  Musculoskeletal: Normal range of motion.  Skin:    General: Skin is warm and dry.  Neurological:     Mental Status: She is alert and oriented to person, place, and time.  Psychiatric:        Mood and Affect: Mood normal.        Behavior: Behavior normal.     RECENT LABS AND TESTS: BMET    Component Value Date/Time   NA 142 09/29/2019 0900   K 4.4 09/29/2019 0900   CL 107 (H) 09/29/2019 0900   CO2 22 09/29/2019 0900   GLUCOSE 86 09/29/2019 0900   GLUCOSE 93 01/13/2010 0220  BUN 15 09/29/2019 0900   CREATININE 0.65 09/29/2019 0900   CALCIUM 9.0 09/29/2019 0900   GFRNONAA 115 09/29/2019 0900   GFRAA 133 09/29/2019 0900   Lab Results  Component Value Date   HGBA1C 5.2 09/29/2019   HGBA1C 5.3 12/04/2018   HGBA1C 5.2 03/04/2018   HGBA1C 5.1 11/01/2017   HGBA1C 5.5 07/13/2017   Lab Results  Component Value Date   INSULIN 13.5 09/29/2019   INSULIN 14.4 12/04/2018   INSULIN 8.5 03/04/2018   INSULIN 14.2 11/01/2017   INSULIN 11.0 07/13/2017   CBC    Component Value Date/Time   WBC 6.8 03/04/2018 0740   WBC 12.2 (H) 01/13/2010 0205   RBC 4.44 03/04/2018 0740   RBC 5.05 01/13/2010 0205   HGB 11.7 03/04/2018 0740   HCT 37.1 03/04/2018 0740   PLT 383 01/13/2010 0205   MCV 84 03/04/2018 0740   MCH 26.4 (L) 03/04/2018 0740   MCHC 31.5 03/04/2018 0740   MCHC 33.9 01/13/2010 0205   RDW 14.6 03/04/2018 0740   LYMPHSABS 2.4 03/04/2018 0740   MONOABS 0.7 01/13/2010 0205   EOSABS 0.1 03/04/2018 0740   BASOSABS 0.0 03/04/2018 0740   Iron/TIBC/Ferritin/ %Sat No results found for: IRON, TIBC, FERRITIN, IRONPCTSAT Lipid Panel     Component Value Date/Time   CHOL  146 09/29/2019 0900   TRIG 32 09/29/2019 0900   HDL 65 09/29/2019 0900   LDLCALC 73 09/29/2019 0900   Hepatic Function Panel     Component Value Date/Time   PROT 6.7 09/29/2019 0900   ALBUMIN 3.8 09/29/2019 0900   AST 16 09/29/2019 0900   ALT 8 09/29/2019 0900   ALKPHOS 94 09/29/2019 0900   BILITOT <0.2 09/29/2019 0900   BILIDIR 0.2 01/13/2010 0205   IBILI 0.8 01/13/2010 0205      Component Value Date/Time   TSH 2.570 09/29/2019 0900   TSH 4.090 03/04/2018 0740   TSH 1.650 07/13/2017 1030     Ref. Range 09/29/2019 09:00  Vitamin D, 25-Hydroxy Latest Ref Range: 30.0 - 100.0 ng/mL 20.5 (L)    OBESITY BEHAVIORAL INTERVENTION VISIT  Today's visit was # 32  Starting weight: 231 lbs Starting date: 07/13/2017 Today's weight : 226 lbs Today's date: 11/03/2019 Total lbs lost to date: 5    11/03/2019  Height 5\' 3"  (1.6 m)  Weight 226 lb (102.5 kg)  BMI (Calculated) 40.04  BLOOD PRESSURE - SYSTOLIC [id number]  BLOOD PRESSURE - DIASTOLIC 63   Body Fat % [id number] %  Total Body Water (lbs) 84.6 lbs    ASK: We discussed the diagnosis of obesity with Heather Boone today and Heather Boone agreed to give Heather Boone permission to discuss obesity behavioral modification therapy today.  ASSESS: Bernardette has the diagnosis of obesity and her BMI today is 40.04 Heather Boone is in the action stage of change   ADVISE: Laylin was educated on the multiple health risks of obesity as well as the benefit of weight loss to improve her health. She was advised of the need for long term treatment and the importance of lifestyle modifications to improve her current health and to decrease her risk of future health problems.  AGREE: Multiple dietary modification options and treatment options were discussed and  Heather Boone agreed to follow the recommendations documented in the above note.  ARRANGE: Heather Boone was educated on the importance of frequent visits to treat obesity as outlined per CMS and USPSTF guidelines and  agreed to schedule her next follow up appointment today.  Heather Boone Blanks  Valere Dross, am acting as Location manager for [employer], FNP-C.  I have reviewed the above documentation for accuracy and completeness, and I agree with the above.  -  , FNP-C.

## 2019-11-24 ENCOUNTER — Encounter (INDEPENDENT_AMBULATORY_CARE_PROVIDER_SITE_OTHER): Payer: Self-pay

## 2019-11-24 ENCOUNTER — Ambulatory Visit (INDEPENDENT_AMBULATORY_CARE_PROVIDER_SITE_OTHER): Payer: 59 | Admitting: Family Medicine

## 2020-01-19 ENCOUNTER — Other Ambulatory Visit: Payer: Self-pay

## 2020-01-19 ENCOUNTER — Ambulatory Visit (INDEPENDENT_AMBULATORY_CARE_PROVIDER_SITE_OTHER): Payer: 59 | Admitting: Family Medicine

## 2020-01-19 ENCOUNTER — Encounter (INDEPENDENT_AMBULATORY_CARE_PROVIDER_SITE_OTHER): Payer: Self-pay | Admitting: Family Medicine

## 2020-01-19 VITALS — BP 121/72 | HR 77 | Temp 98.1°F | Ht 63.0 in | Wt 231.0 lb

## 2020-01-19 DIAGNOSIS — Z9189 Other specified personal risk factors, not elsewhere classified: Secondary | ICD-10-CM

## 2020-01-19 DIAGNOSIS — F3289 Other specified depressive episodes: Secondary | ICD-10-CM | POA: Diagnosis not present

## 2020-01-19 DIAGNOSIS — E8881 Metabolic syndrome: Secondary | ICD-10-CM

## 2020-01-19 DIAGNOSIS — E559 Vitamin D deficiency, unspecified: Secondary | ICD-10-CM

## 2020-01-19 DIAGNOSIS — Z6841 Body Mass Index (BMI) 40.0 and over, adult: Secondary | ICD-10-CM

## 2020-01-19 MED ORDER — VITAMIN D (ERGOCALCIFEROL) 1.25 MG (50000 UNIT) PO CAPS: 50000.0000 [IU] | ORAL_CAPSULE | ORAL | 0 refills | Status: DC

## 2020-01-19 MED ORDER — METFORMIN HCL 500 MG PO TABS: 500.0000 mg | ORAL_TABLET | Freq: Three times a day (TID) | ORAL | 0 refills | Status: DC

## 2020-01-19 MED ORDER — BUPROPION HCL ER (SR) 200 MG PO TB12: 200.0000 mg | ORAL_TABLET | Freq: Two times a day (BID) | ORAL | 0 refills | Status: DC

## 2020-01-20 NOTE — Progress Notes (Signed)
Chief Complaint:   OBESITY Heather Boone is here to discuss her progress with her obesity treatment plan along with follow-up of her obesity related diagnoses. Heather Boone is on keeping a food journal of 1200 calories and 75 grams of protein daily plan and states she is following her eating plan approximately 85% of the time. Heather Boone states she is walking 40 minutes 2 times per week.  Today's visit was #: 3 Starting weight: 219 lbs Starting date: 09/29/2019 Today's weight: 231 lbs Today's date: 01/19/2020 Total lbs lost to date: 0 Total lbs lost since last in-office visit: 0  Interim History: Heather Boone has not followed up for more than ten weeks and has gained 5 lbs. She started back on the plan after Christmas. She is averaging 75 to 80 grams of protein daily.  Subjective:   Vitamin D deficiency - Plan: Vitamin D, Ergocalciferol, (DRISDOL) 1.25 MG (50000 UNIT) CAPS capsule Heather Boone's Vitamin D level was 20.5 on 09/29/19 and was not at goal. She is currently taking prescription vit D every 3 days. She admits fatigue.  Insulin resistance - Plan: metFORMIN (GLUCOPHAGE) 500 MG tablet Heather Boone has a diagnosis of insulin resistance based on her elevated fasting insulin level >5. She is on metformin three times daily. She continues to work on diet and exercise to decrease her risk of diabetes. Heather Boone denies polyphagia. She has no diarrhea or nausea.  Lab Results  Component Value Date   INSULIN 13.5 09/29/2019   INSULIN 14.4 12/04/2018   INSULIN 8.5 03/04/2018   INSULIN 14.2 11/01/2017   INSULIN 11.0 07/13/2017   Lab Results  Component Value Date   HGBA1C 5.2 09/29/2019   Other depression - with emotional eating - Plan: buPROPion (WELLBUTRIN SR) 200 MG 12 hr tablet  She denies emotional eating at this time. She has been working on behavior modification techniques to help reduce her emotional eating and has been somewhat successful. Her mood is stable. She shows no sign of  suicidal or homicidal ideations.  At risk for osteoporosis Heather Boone is at higher risk of osteopenia and osteoporosis due to Vitamin D deficiency.   Assessment/Plan:   Vitamin D deficiency - Plan: Vitamin D, Ergocalciferol, (DRISDOL) 1.25 MG (50000 UNIT) CAPS capsule Low Vitamin D level contributes to fatigue and are associated with obesity, breast, and colon cancer. Heather Boone agrees to continue to take prescription Vitamin D @50 ,000 IU every 3 days #10 with no refills and she will follow-up for routine testing of Vitamin D, at least 2-3 times per year to avoid over-replacement.  Insulin resistance - Plan: metFORMIN (GLUCOPHAGE) 500 MG tablet Heather Boone will continue to work on weight loss, exercise, and decreasing simple carbohydrates to help decrease the risk of diabetes. Heather Boone agreed to continue metformin 500 mg 3 times daily #90 with no refills and follow-up with [place] as directed to closely monitor her progress.  Other depression - with emotional eating - Plan: buPROPion (WELLBUTRIN SR) 200 MG 12 hr tablet Behavior modification techniques were discussed today to help Heather Boone deal with her emotional/non-hunger eating behaviors. Heather Boone agrees to continue Bupropion 200 mg three times daily #60 with no refills. Orders and follow up as documented in patient record.   At risk for osteoporosis Heather Boone was given approximately 15 minutes of osteoporosis prevention counseling today. Heather Boone is at risk for osteopenia and osteoporosis due to her Vitamin D deficiency. She was encouraged to take her Vitamin D and follow her higher calcium diet and increase strengthening exercise to help strengthen her  bones and decrease her risk of osteopenia and osteoporosis.  Repetitive spaced learning was employed today to elicit superior memory formation and behavioral change.  Class 3 severe obesity with serious comorbidity and body mass index (BMI) of 40.0 to 44.9 in adult, unspecified obesity type Heather Boone) Heather Boone  is currently in the action stage of change. As such, her goal is to continue with weight loss efforts. She has agreed to keeping a food journal and adhering to recommended goals of 1200 to 1300 calories and 80 grams of protein daily.   Exercise goals: Heather Boone will continue walking for 40 minutes, 2 times per week.  Behavioral modification strategies: increasing lean protein intake and planning for success.  Heather Boone has agreed to follow-up with our clinic in 2 weeks. She was informed of the importance of frequent follow-up visits to maximize her success with intensive lifestyle modifications for her multiple health conditions.   Objective:   Blood pressure 121/72, pulse 77, temperature 98.1 F (36.7 C), temperature source Oral, height 5\' 3"  (1.6 m), weight 231 lb (104.8 kg), SpO2 98 %. Body mass index is 40.92 kg/m.  General: Cooperative, alert, well developed, in no acute distress. HEENT: Conjunctivae and lids unremarkable. Cardiovascular: Regular rhythm.  Lungs: Normal work of breathing. Neurologic: No focal deficits.   Lab Results  Component Value Date   CREATININE 0.65 09/29/2019   BUN 15 09/29/2019   NA 142 09/29/2019   K 4.4 09/29/2019   CL 107 (H) 09/29/2019   CO2 22 09/29/2019   Lab Results  Component Value Date   ALT 8 09/29/2019   AST 16 09/29/2019   ALKPHOS 94 09/29/2019   BILITOT <0.2 09/29/2019   Lab Results  Component Value Date   HGBA1C 5.2 09/29/2019   HGBA1C 5.3 12/04/2018   HGBA1C 5.2 03/04/2018   HGBA1C 5.1 11/01/2017   HGBA1C 5.5 07/13/2017   Lab Results  Component Value Date   INSULIN 13.5 09/29/2019   INSULIN 14.4 12/04/2018   INSULIN 8.5 03/04/2018   INSULIN 14.2 11/01/2017   INSULIN 11.0 07/13/2017   Lab Results  Component Value Date   TSH 2.570 09/29/2019   Lab Results  Component Value Date   CHOL 146 09/29/2019   HDL 65 09/29/2019   LDLCALC 73 09/29/2019   TRIG 32 09/29/2019   Lab Results  Component Value Date   WBC 6.8  03/04/2018   HGB 11.7 03/04/2018   HCT 37.1 03/04/2018   MCV 84 03/04/2018   PLT 383 01/13/2010   No results found for: IRON, TIBC, FERRITIN   Ref. Range 09/29/2019 09:00  Vitamin D, 25-Hydroxy Latest Ref Range: 30.0 - 100.0 ng/mL 20.5 (L)    Attestation Statements:   Reviewed by clinician on day of visit: allergies, medications, problem list, medical history, surgical history, family history, social history, and previous encounter notes.  Corey Skains, am acting as Location manager for [employer], FNP-C.  I have reviewed the above documentation for accuracy and completeness, and I agree with the above. -  Martena Emanuele [employer], FNP-C

## 2020-01-21 ENCOUNTER — Encounter (INDEPENDENT_AMBULATORY_CARE_PROVIDER_SITE_OTHER): Payer: Self-pay | Admitting: Family Medicine

## 2020-02-03 ENCOUNTER — Encounter (INDEPENDENT_AMBULATORY_CARE_PROVIDER_SITE_OTHER): Payer: Self-pay | Admitting: Family Medicine

## 2020-02-03 ENCOUNTER — Ambulatory Visit (INDEPENDENT_AMBULATORY_CARE_PROVIDER_SITE_OTHER): Payer: 59 | Admitting: Family Medicine

## 2020-02-03 ENCOUNTER — Other Ambulatory Visit: Payer: Self-pay

## 2020-02-03 VITALS — BP 123/77 | HR 77 | Temp 98.2°F | Ht 63.0 in | Wt 231.0 lb

## 2020-02-03 DIAGNOSIS — F3289 Other specified depressive episodes: Secondary | ICD-10-CM

## 2020-02-03 DIAGNOSIS — E559 Vitamin D deficiency, unspecified: Secondary | ICD-10-CM

## 2020-02-03 DIAGNOSIS — Z6841 Body Mass Index (BMI) 40.0 and over, adult: Secondary | ICD-10-CM

## 2020-02-03 MED ORDER — VITAMIN D (ERGOCALCIFEROL) 1.25 MG (50000 UNIT) PO CAPS: 50000.0000 [IU] | ORAL_CAPSULE | ORAL | 0 refills | Status: DC

## 2020-02-04 ENCOUNTER — Encounter (INDEPENDENT_AMBULATORY_CARE_PROVIDER_SITE_OTHER): Payer: Self-pay | Admitting: Family Medicine

## 2020-02-04 NOTE — Progress Notes (Signed)
Chief Complaint:   OBESITY Heather Boone is here to discuss her progress with her obesity treatment plan along with follow-up of her obesity related diagnoses. Heather Boone is on the keeping a food journal of 1200 to 1300 calories and 75 grams of protein daily plan and states she is following her eating plan approximately 80 to 85% of the time. Heather Boone states she is walking steps 45 minutes 2 times per week.  Today's visit was #: 49 Starting weight: 219 lbs Starting date: 09/29/2019 Today's weight: 231 lbs Today's date: 02/03/2020 Total lbs lost to date: 0 Total lbs lost since last in-office visit: 0  Interim History: Heather Boone has met her protein and calorie goals most days. She denies excessive hunger. She is journaling consistently.  Subjective:   Vitamin D deficiency Heather Boone's Vitamin D level was 20.5 on 09/29/19. She is currently taking prescription vit D. She denies nausea, vomiting or muscle weakness.  Other depression, with emotional eating  Heather Boone struggles with emotional eating and she feels that Bupropion helps with this, however she does report cravings on the weekends. She has been working on behavior modification techniques to help reduce her emotional eating and has been somewhat successful. She shows no sign of suicidal or homicidal ideations.  Assessment/Plan:   Vitamin D deficiency Low Vitamin D level contributes to fatigue and are associated with obesity, breast, and colon cancer. Heather Boone agrees to continue to take prescription Vitamin D @50 ,000 IU every week #4 with no refills and we will check vitamin D level at the next visit. She will follow-up for routine testing of Vitamin D, at least 2-3 times per year to avoid over-replacement.  Other depression, with emotional eating  Heather Boone will continue Bupropion. We discussed strategies to decrease emotional eating. Orders and follow up as documented in patient record.   Class 3 severe obesity with serious  comorbidity and body mass index (BMI) of 40.0 to 44.9 in adult, unspecified obesity type Heather Boone) Heather Boone is currently in the action stage of change. As such, her goal is to continue with weight loss efforts. She has agreed to keeping a food journal and adhering to recommended goals of 1200 to 1300 calories and 80 grams of protein daily.   Exercise goals: Heather Boone will continue her current exercise regimen.  Behavioral modification strategies: increasing lean protein intake, planning for success and keeping a strict food journal.  Heather Boone has agreed to follow-up with our clinic in 2 weeks fasting. She was informed of the importance of frequent follow-up visits to maximize her success with intensive lifestyle modifications for her multiple health conditions.   Objective:   Blood pressure 123/77, pulse 77, temperature 98.2 F (36.8 C), temperature source Oral, height 5' 3"  (1.6 m), weight 231 lb (104.8 kg), last menstrual period 01/27/2020, SpO2 98 %. Body mass index is 40.92 kg/m.  General: Cooperative, alert, well developed, in no acute distress. HEENT: Conjunctivae and lids unremarkable. Cardiovascular: Regular rhythm.  Lungs: Normal work of breathing. Neurologic: No focal deficits.   Lab Results  Component Value Date   CREATININE 0.65 09/29/2019   BUN 15 09/29/2019   NA 142 09/29/2019   K 4.4 09/29/2019   CL 107 (H) 09/29/2019   CO2 22 09/29/2019   Lab Results  Component Value Date   ALT 8 09/29/2019   AST 16 09/29/2019   ALKPHOS 94 09/29/2019   BILITOT <0.2 09/29/2019   Lab Results  Component Value Date   HGBA1C 5.2 09/29/2019   HGBA1C 5.3 12/04/2018  HGBA1C 5.2 03/04/2018   HGBA1C 5.1 11/01/2017   HGBA1C 5.5 07/13/2017   Lab Results  Component Value Date   INSULIN 13.5 09/29/2019   INSULIN 14.4 12/04/2018   INSULIN 8.5 03/04/2018   INSULIN 14.2 11/01/2017   INSULIN 11.0 07/13/2017   Lab Results  Component Value Date   TSH 2.570 09/29/2019   Lab Results    Component Value Date   CHOL 146 09/29/2019   HDL 65 09/29/2019   LDLCALC 73 09/29/2019   TRIG 32 09/29/2019   Lab Results  Component Value Date   WBC 6.8 03/04/2018   HGB 11.7 03/04/2018   HCT 37.1 03/04/2018   MCV 84 03/04/2018   PLT 383 01/13/2010   No results found for: IRON, TIBC, FERRITIN   Ref. Range 09/29/2019 09:00  Vitamin D, 25-Hydroxy Latest Ref Range: 30.0 - 100.0 ng/mL 20.5 (L)    Attestation Statements:   Reviewed by clinician on day of visit: allergies, medications, problem list, medical history, surgical history, family history, social history, and previous encounter notes.  Corey Skains, am acting as Location manager for [employer], FNP-C.  I have reviewed the above documentation for accuracy and completeness, and I agree with the above. -  Amery Vandenbos [employer], FNP-C

## 2020-02-18 ENCOUNTER — Ambulatory Visit (INDEPENDENT_AMBULATORY_CARE_PROVIDER_SITE_OTHER): Payer: 59 | Admitting: Family Medicine

## 2020-03-10 ENCOUNTER — Ambulatory Visit (INDEPENDENT_AMBULATORY_CARE_PROVIDER_SITE_OTHER): Payer: 59 | Admitting: Family Medicine

## 2020-03-10 ENCOUNTER — Other Ambulatory Visit: Payer: Self-pay

## 2020-03-10 ENCOUNTER — Encounter (INDEPENDENT_AMBULATORY_CARE_PROVIDER_SITE_OTHER): Payer: Self-pay | Admitting: Family Medicine

## 2020-03-10 ENCOUNTER — Other Ambulatory Visit (INDEPENDENT_AMBULATORY_CARE_PROVIDER_SITE_OTHER): Payer: Self-pay | Admitting: Family Medicine

## 2020-03-10 VITALS — BP 105/61 | HR 76 | Temp 97.9°F | Ht 63.0 in | Wt 232.0 lb

## 2020-03-10 DIAGNOSIS — E8881 Metabolic syndrome: Secondary | ICD-10-CM

## 2020-03-10 DIAGNOSIS — F3289 Other specified depressive episodes: Secondary | ICD-10-CM | POA: Diagnosis not present

## 2020-03-10 DIAGNOSIS — E559 Vitamin D deficiency, unspecified: Secondary | ICD-10-CM | POA: Diagnosis not present

## 2020-03-10 DIAGNOSIS — Z6841 Body Mass Index (BMI) 40.0 and over, adult: Secondary | ICD-10-CM

## 2020-03-10 DIAGNOSIS — Z9189 Other specified personal risk factors, not elsewhere classified: Secondary | ICD-10-CM | POA: Diagnosis not present

## 2020-03-10 MED ORDER — VITAMIN D (ERGOCALCIFEROL) 1.25 MG (50000 UNIT) PO CAPS: 50000.0000 [IU] | ORAL_CAPSULE | ORAL | 0 refills | Status: DC

## 2020-03-10 MED ORDER — METFORMIN HCL 500 MG PO TABS: 500.0000 mg | ORAL_TABLET | Freq: Three times a day (TID) | ORAL | 0 refills | Status: DC

## 2020-03-10 MED ORDER — VICTOZA 18 MG/3ML ~~LOC~~ SOPN: 0.6000 mg | PEN_INJECTOR | Freq: Every day | SUBCUTANEOUS | 0 refills | Status: DC

## 2020-03-10 MED ORDER — INSULIN PEN NEEDLE 32G X 4 MM MISC: 0 refills | Status: DC

## 2020-03-10 MED ORDER — BUPROPION HCL ER (SR) 200 MG PO TB12: 200.0000 mg | ORAL_TABLET | Freq: Every day | ORAL | 0 refills | Status: DC

## 2020-03-10 NOTE — Progress Notes (Signed)
Chief Complaint:   Heather Boone is here to discuss her progress with her Heather treatment plan along with follow-up of her Heather related diagnoses. Heather Boone is keeping a food journal and adhering to recommended goals of 1200-1300 calories and 80 grams of protein and states she is following her eating plan approximately 75% of the time. Heather Boone states she is walking 30 minutes 3 times per week.  Today's visit was #: 31 Starting weight: 231 lbs Starting date: 07/13/2017 Today's weight: 232 lbs Today's date: 03/10/2020 Total lbs lost to date: 0 Total lbs lost since last in-office visit: 0  Interim History: Heather Boone is journaling most days (5 out of 7). She is meeting protein goals consistently, but occasionally goes over on calories.  Subjective:   Other depression, with emotional eating. Heather Boone is struggling with emotional eating and using food for comfort to the extent that it is negatively impacting her health. She has been working on behavior modification techniques to help reduce her emotional eating and has been somewhat successful. She shows no sign of suicidal or homicidal ideations. Heather Boone states symptoms are fairly well controlled on bupropion.  Insulin resistance. Heather Boone has a diagnosis of insulin resistance based on her elevated fasting insulin level >5. She continues to work on diet and exercise to decrease her risk of diabetes. She reports polyphagia after dinner. She is on metformin.  Lab Results  Component Value Date   INSULIN 13.5 09/29/2019   INSULIN 14.4 12/04/2018   INSULIN 8.5 03/04/2018   INSULIN 14.2 11/01/2017   INSULIN 11.0 07/13/2017   Lab Results  Component Value Date   HGBA1C 5.2 09/29/2019   Vitamin D deficiency. Last Vitamin D was low at 20.5 on 09/29/2019. Heather Boone is on prescription Vitamin D.  At risk for adverse drug interaction. Heather Boone is at risk of side effects from Victoza.  Assessment/Plan:   Other depression,  with emotional eating. Behavior modification techniques were discussed today to help Heather Boone deal with her emotional/non-hunger eating behaviors.  Orders and follow up as documented in patient record. Heather Boone was given a refill on her buPROPion (WELLBUTRIN SR) 200 MG 12 hr tablet QD #30 with 0 refills.  Insulin resistance. Heather Boone will continue to work on weight loss, exercise, and decreasing simple carbohydrates to help decrease the risk of diabetes. Heather Boone agreed to follow-up with [place] as directed to closely monitor her progress. She was given a new prescription for liraglutide (VICTOZA) 18 MG/3ML SOPN 0.6 mg daily (to take 0.3 for 4 days and then increase to 0.6) #1 pen with 0 refills and given a prescription for Nano Insulin Pen Needle 32G X 4 MM MISC #100 with 0 refills. She was given a refill on her metFORMIN (GLUCOPHAGE) 500 MG tablet TID #90 with 0 refills. Insulin, random, Hemoglobin A1c, Comprehensive metabolic panel labs ordered today.  Vitamin D deficiency. Low Vitamin D level contributes to fatigue and are associated with Heather, breast, and colon cancer. She was given a refill on her Vitamin D, Ergocalciferol, (DRISDOL) 1.25 MG (50000 UNIT) CAPS capsule every 3 days #10 with 0 refills and VITAMIN D 25 Hydroxy (Vit-D Deficiency, Fractures) level ordered today.  At risk for adverse drug interaction. Heather Boone was given approximately 15 minutes of drug side effect counseling today.  We discussed side effect possibility and risk versus benefits. Heather Boone agreed to the medication and will contact this office if these side effects are intolerable.  Repetitive spaced learning was employed today to elicit superior memory formation and  behavioral change.  Class 3 severe Heather with serious comorbidity and body mass index (BMI) of 40.0 to 44.9 in adult, unspecified Heather type [facility])  Heather Boone is currently in the action stage of change. As such, her goal is to continue with weight loss efforts.  She has agreed to keeping a food journal and adhering to recommended goals of 1200-1300 calories and 80 grams of protein daily.   Exercise goals: Heather Boone will continue her current exercise regimen.  Behavioral modification strategies: decreasing simple carbohydrates, planning for success and keeping a strict food journal.  Heather Boone has agreed to follow-up with our clinic in 3 weeks. She was informed of the importance of frequent follow-up visits to maximize her success with intensive lifestyle modifications for her multiple health conditions.   Heather Boone was informed we would discuss her lab results at her next visit unless there is a critical issue that needs to be addressed sooner. Heather Boone agreed to keep her next visit at the agreed upon time to discuss these results.  Objective:   Blood pressure 105/61, pulse 76, temperature 97.9 F (36.6 C), temperature source Oral, height 5\' 3"  (1.6 m), weight 232 lb (105.2 kg), last menstrual period 02/14/2020, SpO2 100 %. Body mass index is 41.1 kg/m.  General: Cooperative, alert, well developed, in no acute distress. HEENT: Conjunctivae and lids unremarkable. Cardiovascular: Regular rhythm.  Lungs: Normal work of breathing. Neurologic: No focal deficits.   Lab Results  Component Value Date   CREATININE 0.65 09/29/2019   BUN 15 09/29/2019   NA 142 09/29/2019   K 4.4 09/29/2019   CL 107 (H) 09/29/2019   CO2 22 09/29/2019   Lab Results  Component Value Date   ALT 8 09/29/2019   AST 16 09/29/2019   ALKPHOS 94 09/29/2019   BILITOT <0.2 09/29/2019   Lab Results  Component Value Date   HGBA1C 5.2 09/29/2019   HGBA1C 5.3 12/04/2018   HGBA1C 5.2 03/04/2018   HGBA1C 5.1 11/01/2017   HGBA1C 5.5 07/13/2017   Lab Results  Component Value Date   INSULIN 13.5 09/29/2019   INSULIN 14.4 12/04/2018   INSULIN 8.5 03/04/2018   INSULIN 14.2 11/01/2017   INSULIN 11.0 07/13/2017   Lab Results  Component Value Date   TSH 2.570 09/29/2019    Lab Results  Component Value Date   CHOL 146 09/29/2019   HDL 65 09/29/2019   LDLCALC 73 09/29/2019   TRIG 32 09/29/2019   Lab Results  Component Value Date   WBC 6.8 03/04/2018   HGB 11.7 03/04/2018   HCT 37.1 03/04/2018   MCV 84 03/04/2018   PLT 383 01/13/2010   No results found for: IRON, TIBC, FERRITIN  Attestation Statements:   Reviewed by clinician on day of visit: allergies, medications, problem list, medical history, surgical history, family history, social history, and previous encounter notes.  IMichaelene Song, am acting as Location manager for [employer], FNP   I have reviewed the above documentation for accuracy and completeness, and I agree with the above. -  Georgianne Fick, FNP

## 2020-03-11 ENCOUNTER — Encounter (INDEPENDENT_AMBULATORY_CARE_PROVIDER_SITE_OTHER): Payer: Self-pay | Admitting: Family Medicine

## 2020-03-11 DIAGNOSIS — N970 Female infertility associated with anovulation: Secondary | ICD-10-CM

## 2020-03-11 LAB — COMPREHENSIVE METABOLIC PANEL
ALT: 7 IU/L (ref 0–32)
AST: 14 IU/L (ref 0–40)
Albumin/Globulin Ratio: 1.4 (ref 1.2–2.2)
Albumin: 3.7 g/dL — ABNORMAL LOW (ref 3.8–4.8)
Alkaline Phosphatase: 81 IU/L (ref 39–117)
BUN/Creatinine Ratio: 17 (ref 9–23)
BUN: 12 mg/dL (ref 6–20)
Bilirubin Total: 0.3 mg/dL (ref 0.0–1.2)
CO2: 22 mmol/L (ref 20–29)
Calcium: 9 mg/dL (ref 8.7–10.2)
Chloride: 105 mmol/L (ref 96–106)
Creatinine, Ser: 0.69 mg/dL (ref 0.57–1.00)
GFR calc Af Amer: 130 mL/min/{1.73_m2} (ref >59)
GFR calc non Af Amer: 112 mL/min/{1.73_m2} (ref >59)
Globulin, Total: 2.7 g/dL (ref 1.5–4.5)
Glucose: 88 mg/dL (ref 65–99)
Potassium: 4.3 mmol/L (ref 3.5–5.2)
Sodium: 140 mmol/L (ref 134–144)
Total Protein: 6.4 g/dL (ref 6.0–8.5)

## 2020-03-11 LAB — INSULIN, RANDOM: INSULIN: 15.6 u[IU]/mL (ref 2.6–24.9)

## 2020-03-11 LAB — VITAMIN D 25 HYDROXY (VIT D DEFICIENCY, FRACTURES): Vit D, 25-Hydroxy: 22.4 ng/mL — ABNORMAL LOW (ref 30.0–100.0)

## 2020-03-11 LAB — HEMOGLOBIN A1C
Est. average glucose Bld gHb Est-mCnc: 103 mg/dL
Hgb A1c MFr Bld: 5.2 % (ref 4.8–5.6)

## 2020-03-12 MED ORDER — CLOMIPHENE CITRATE 50 MG PO TABS: ORAL_TABLET | ORAL | 0 refills | Status: DC

## 2020-03-31 ENCOUNTER — Ambulatory Visit (INDEPENDENT_AMBULATORY_CARE_PROVIDER_SITE_OTHER): Payer: 59 | Admitting: Family Medicine

## 2020-03-31 ENCOUNTER — Encounter (INDEPENDENT_AMBULATORY_CARE_PROVIDER_SITE_OTHER): Payer: Self-pay | Admitting: Family Medicine

## 2020-03-31 ENCOUNTER — Other Ambulatory Visit: Payer: Self-pay

## 2020-03-31 VITALS — BP 106/67 | HR 86 | Temp 98.7°F | Ht 63.0 in | Wt 231.0 lb

## 2020-03-31 DIAGNOSIS — F3289 Other specified depressive episodes: Secondary | ICD-10-CM | POA: Diagnosis not present

## 2020-03-31 DIAGNOSIS — Z9189 Other specified personal risk factors, not elsewhere classified: Secondary | ICD-10-CM | POA: Diagnosis not present

## 2020-03-31 DIAGNOSIS — Z6841 Body Mass Index (BMI) 40.0 and over, adult: Secondary | ICD-10-CM

## 2020-03-31 DIAGNOSIS — E8881 Metabolic syndrome: Secondary | ICD-10-CM

## 2020-03-31 DIAGNOSIS — K219 Gastro-esophageal reflux disease without esophagitis: Secondary | ICD-10-CM | POA: Diagnosis not present

## 2020-03-31 DIAGNOSIS — E559 Vitamin D deficiency, unspecified: Secondary | ICD-10-CM

## 2020-03-31 MED ORDER — BUPROPION HCL ER (SR) 200 MG PO TB12: 200.0000 mg | ORAL_TABLET | Freq: Every day | ORAL | 0 refills | Status: DC

## 2020-03-31 MED ORDER — METFORMIN HCL 500 MG PO TABS: 500.0000 mg | ORAL_TABLET | Freq: Three times a day (TID) | ORAL | 0 refills | Status: DC

## 2020-03-31 MED ORDER — VITAMIN D3 1.25 MG (50000 UT) PO CAPS: 1.0000 | ORAL_CAPSULE | ORAL | 0 refills | Status: DC

## 2020-03-31 NOTE — Progress Notes (Signed)
Chief Complaint:   OBESITY Heather Boone is here to discuss her progress with her obesity treatment plan along with follow-up of her obesity related diagnoses. Heather Boone is on keeping a food journal and adhering to recommended goals of 1200-1300 calories and 80 grams of protein daily and states she is following her eating plan approximately 90% of the time. Heather Boone states she is walking for 45 minutes 2 times per week.  Today's visit was #: 30 Starting weight: 231 lbs Starting date: 07/13/2017 Today's weight: 231 lbs Today's date: 03/31/2020 Total lbs lost to date: 0 Total lbs lost since last in-office visit: 1  Interim History: Heather Boone is journaling daily and she reports eating healthier. She is meeting her protein goals consistently. She is quite busy with a full-time job and she is a full-time Presenter, broadcasting.   Subjective:   1. Insulin resistance Heather Boone started Victoza 0.6 mg/day at her last visit. She had one incident of nausea but none after. She notices increased satiety.  2. Gastroesophageal reflux disease, unspecified whether esophagitis present Heather Boone is status post gastric band in 2011. She has not had a band adjustment in a while. She has had severe reflux for 2 months. She notes that she has tried making changes in her diet as far as timing and avoiding trigger foods without success.  3. Vitamin D deficiency Heather Boone's Vit D level has increased very little on weekly Vit D2 over the past 3 months.  4. Other depression, with emotional eating  Heather Boone denies emotional eating.  5. At risk for osteoporosis Heather Boone is at higher risk of osteopenia and osteoporosis due to Vitamin D deficiency.   Assessment/Plan:   1. Insulin resistance Heather Boone will continue to work on weight loss, exercise, and decreasing simple carbohydrates to help decrease the risk of diabetes. Heather Boone agreed to continue Victoza at 0.6 mg daily and we will refill metformin for 1 month. Heather Boone  agreed to follow-up with [place] as directed to closely monitor her progress.  - metFORMIN (GLUCOPHAGE) 500 MG tablet; Take 1 tablet (500 mg total) by mouth 3 (three) times daily.  Dispense: 90 tablet; Refill: 0  2. Gastroesophageal reflux disease, unspecified whether esophagitis present GERD may be related to Heather Boone's gastric band placement or possibly esophageal dilatation above the band. I advised her to call [facility] to have her band evaluated.   3. Vitamin D deficiency Low Vitamin D level contributes to fatigue and are associated with obesity, breast, and colon cancer. Heather Boone agreed to discontinue ergocalciferol, and start cholecalciferol 50,000 IU every 3 days with no refills. She will follow-up for routine testing of Vitamin D, at least 2-3 times per year to avoid over-replacement.   - Cholecalciferol (VITAMIN D3) 1.25 MG (50000 UT) CAPS; Take 1 Dose by mouth once a week.  Dispense: 4 capsule; Refill: 0  4. Other depression, with emotional eating  Behavior modification techniques were discussed today to help Heather Boone deal with her emotional/non-hunger eating behaviors. We will refill bupropion SR for 1 month. Orders and follow up as documented in patient record.   - buPROPion (WELLBUTRIN SR) 200 MG 12 hr tablet; Take 1 tablet (200 mg total) by mouth daily.  Dispense: 30 tablet; Refill: 0  5. At risk for osteoporosis Heather Boone was given approximately 15 minutes of osteoporosis prevention counseling today. Heather Boone is at risk for osteopenia and osteoporosis due to her Vitamin D deficiency. She was encouraged to take her Vitamin D and follow her higher calcium diet and increase strengthening exercise  to help strengthen her bones and decrease her risk of osteopenia and osteoporosis.  Repetitive spaced learning was employed today to elicit superior memory formation and behavioral change.  6. Class 3 severe obesity with serious comorbidity and body mass index (BMI) of 40.0 to  44.9 in adult, unspecified obesity type (HCC) Heather Boone is currently in the action stage of change. As such, her goal is to continue with weight loss efforts. She has agreed to keeping a food journal and adhering to recommended goals of 1200-1300 calories and 80 grams of protein daily.   Exercise goals: As is.  Behavioral modification strategies: increasing lean protein intake, planning for success and keeping a strict food journal.  Heather Boone has agreed to follow-up with our clinic in 3 weeks. She was informed of the importance of frequent follow-up visits to maximize her success with intensive lifestyle modifications for her multiple health conditions.   Objective:   Blood pressure 106/67, pulse 86, temperature 98.7 F (37.1 C), temperature source Oral, height 5\' 3"  (1.6 m), weight 231 lb (104.8 kg), last menstrual period 03/11/2020, SpO2 98 %. Body mass index is 40.92 kg/m.  General: Cooperative, alert, well developed, in no acute distress. HEENT: Conjunctivae and lids unremarkable. Cardiovascular: Regular rhythm.  Lungs: Normal work of breathing. Neurologic: No focal deficits.   Lab Results  Component Value Date   CREATININE 0.69 03/10/2020   BUN 12 03/10/2020   NA 140 03/10/2020   K 4.3 03/10/2020   CL 105 03/10/2020   CO2 22 03/10/2020   Lab Results  Component Value Date   ALT 7 03/10/2020   AST 14 03/10/2020   ALKPHOS 81 03/10/2020   BILITOT 0.3 03/10/2020   Lab Results  Component Value Date   HGBA1C 5.2 03/10/2020   HGBA1C 5.2 09/29/2019   HGBA1C 5.3 12/04/2018   HGBA1C 5.2 03/04/2018   HGBA1C 5.1 11/01/2017   Lab Results  Component Value Date   INSULIN 15.6 03/10/2020   INSULIN 13.5 09/29/2019   INSULIN 14.4 12/04/2018   INSULIN 8.5 03/04/2018   INSULIN 14.2 11/01/2017   Lab Results  Component Value Date   TSH 2.570 09/29/2019   Lab Results  Component Value Date   CHOL 146 09/29/2019   HDL 65 09/29/2019   LDLCALC 73 09/29/2019   TRIG 32 09/29/2019    Lab Results  Component Value Date   WBC 6.8 03/04/2018   HGB 11.7 03/04/2018   HCT 37.1 03/04/2018   MCV 84 03/04/2018   PLT 383 01/13/2010   No results found for: IRON, TIBC, FERRITIN  Attestation Statements:   Reviewed by clinician on day of visit: allergies, medications, problem list, medical history, surgical history, family history, social history, and previous encounter notes.   Wilhemena Durie, am acting as Location manager for [employer], FNP-C.  I have reviewed the above documentation for accuracy and completeness, and I agree with the above. -  Georgianne Fick, FNP

## 2020-04-21 ENCOUNTER — Ambulatory Visit (INDEPENDENT_AMBULATORY_CARE_PROVIDER_SITE_OTHER): Payer: 59 | Admitting: Family Medicine

## 2020-04-22 ENCOUNTER — Encounter (INDEPENDENT_AMBULATORY_CARE_PROVIDER_SITE_OTHER): Payer: Self-pay | Admitting: Family Medicine

## 2020-04-22 ENCOUNTER — Other Ambulatory Visit: Payer: Self-pay

## 2020-04-22 ENCOUNTER — Ambulatory Visit (INDEPENDENT_AMBULATORY_CARE_PROVIDER_SITE_OTHER): Payer: 59 | Admitting: Family Medicine

## 2020-04-22 VITALS — BP 109/72 | HR 70 | Temp 98.2°F | Ht 63.0 in | Wt 227.0 lb

## 2020-04-22 DIAGNOSIS — Z6841 Body Mass Index (BMI) 40.0 and over, adult: Secondary | ICD-10-CM | POA: Diagnosis not present

## 2020-04-22 DIAGNOSIS — Z9884 Bariatric surgery status: Secondary | ICD-10-CM | POA: Diagnosis not present

## 2020-04-26 ENCOUNTER — Encounter (INDEPENDENT_AMBULATORY_CARE_PROVIDER_SITE_OTHER): Payer: Self-pay | Admitting: Family Medicine

## 2020-04-26 NOTE — Progress Notes (Signed)
Chief Complaint:   OBESITY Heather Boone is here to discuss her progress with her obesity treatment plan along with follow-up of her obesity related diagnoses. Heather Boone is on keeping a food journal and adhering to recommended goals of 1200-1300 calories and 80 grams of protein daily and states she is following her eating plan approximately 85% of the time. Heather Boone states she is walking for 30 minutes 2 times per week.  Today's visit was #: 37 Starting weight: 231 lbs Starting date: 07/13/2017 Today's weight: 227 lbs Today's date: 04/22/2020 Total lbs lost to date: 4 Total lbs lost since last in-office visit: 4  Interim History: Heather Boone is journaling daily and meeting her protein goals. She notes her water intake has been good. She plans to start a spin class at a gym.  Subjective:   1. Status post gastric banding Heather Boone recently saw her bariatric surgeon recently and they removed some fluid from her band. Her band was very tight and she was having heartburn. Her surgeon suggested conversion to duodenal switch.  Assessment/Plan:   1. Status post gastric banding Heather Boone will continue to see her bariatric surgeon as directed.  2. Class 3 severe obesity with serious comorbidity and body mass index (BMI) of 40.0 to 44.9 in adult, unspecified obesity type (HCC) Heather Boone is currently in the action stage of change. As such, her goal is to continue with weight loss efforts. She has agreed to keeping a food journal and adhering to recommended goals of 1200-1300 calories and 80 grams of protein daily.   Exercise goals: Heather Boone is to walk 1 time per week, start spin class 2 times per week, and add arm exercises 2 times per week.  Behavioral modification strategies: keeping a strict food journal.  Heather Boone has agreed to follow-up with our clinic in 2 to 3 weeks. She was informed of the importance of frequent follow-up visits to maximize her success with intensive lifestyle modifications for  her multiple health conditions.   Objective:   Blood pressure 109/72, pulse 70, temperature 98.2 F (36.8 C), temperature source Oral, height 5\' 3"  (1.6 m), weight 227 lb (103 kg), last menstrual period 04/12/2020, SpO2 97 %. Body mass index is 40.21 kg/m.  General: Cooperative, alert, well developed, in no acute distress. HEENT: Conjunctivae and lids unremarkable. Cardiovascular: Regular rhythm.  Lungs: Normal work of breathing. Neurologic: No focal deficits.   Lab Results  Component Value Date   CREATININE 0.69 03/10/2020   BUN 12 03/10/2020   NA 140 03/10/2020   K 4.3 03/10/2020   CL 105 03/10/2020   CO2 22 03/10/2020   Lab Results  Component Value Date   ALT 7 03/10/2020   AST 14 03/10/2020   ALKPHOS 81 03/10/2020   BILITOT 0.3 03/10/2020   Lab Results  Component Value Date   HGBA1C 5.2 03/10/2020   HGBA1C 5.2 09/29/2019   HGBA1C 5.3 12/04/2018   HGBA1C 5.2 03/04/2018   HGBA1C 5.1 11/01/2017   Lab Results  Component Value Date   INSULIN 15.6 03/10/2020   INSULIN 13.5 09/29/2019   INSULIN 14.4 12/04/2018   INSULIN 8.5 03/04/2018   INSULIN 14.2 11/01/2017   Lab Results  Component Value Date   TSH 2.570 09/29/2019   Lab Results  Component Value Date   CHOL 146 09/29/2019   HDL 65 09/29/2019   LDLCALC 73 09/29/2019   TRIG 32 09/29/2019   Lab Results  Component Value Date   WBC 6.8 03/04/2018   HGB 11.7 03/04/2018  HCT 37.1 03/04/2018   MCV 84 03/04/2018   PLT 383 01/13/2010   No results found for: IRON, TIBC, FERRITIN  Attestation Statements:   Reviewed by clinician on day of visit: allergies, medications, problem list, medical history, surgical history, family history, social history, and previous encounter notes.   Trude Mcburney, am acting as Energy manager for [employer], FNP-C.  I have reviewed the above documentation for accuracy and completeness, and I agree with the above. -  Jesse Sans, FNP

## 2020-05-11 ENCOUNTER — Ambulatory Visit (INDEPENDENT_AMBULATORY_CARE_PROVIDER_SITE_OTHER): Payer: 59 | Admitting: Family Medicine

## 2020-05-11 ENCOUNTER — Encounter (INDEPENDENT_AMBULATORY_CARE_PROVIDER_SITE_OTHER): Payer: Self-pay | Admitting: Family Medicine

## 2020-05-11 ENCOUNTER — Other Ambulatory Visit: Payer: Self-pay

## 2020-05-11 VITALS — BP 119/81 | HR 83 | Temp 98.3°F | Ht 63.0 in | Wt 227.0 lb

## 2020-05-11 DIAGNOSIS — F3289 Other specified depressive episodes: Secondary | ICD-10-CM | POA: Diagnosis not present

## 2020-05-11 DIAGNOSIS — E8881 Metabolic syndrome: Secondary | ICD-10-CM

## 2020-05-11 DIAGNOSIS — E559 Vitamin D deficiency, unspecified: Secondary | ICD-10-CM

## 2020-05-11 DIAGNOSIS — Z6841 Body Mass Index (BMI) 40.0 and over, adult: Secondary | ICD-10-CM

## 2020-05-11 DIAGNOSIS — Z9189 Other specified personal risk factors, not elsewhere classified: Secondary | ICD-10-CM

## 2020-05-11 MED ORDER — VITAMIN D3 1.25 MG (50000 UT) PO CAPS: 1.0000 | ORAL_CAPSULE | ORAL | 0 refills | Status: DC

## 2020-05-11 MED ORDER — BUPROPION HCL ER (SR) 200 MG PO TB12: 200.0000 mg | ORAL_TABLET | Freq: Two times a day (BID) | ORAL | 0 refills | Status: DC

## 2020-05-11 NOTE — Progress Notes (Signed)
Chief Complaint:   OBESITY Heather Boone is here to discuss her progress with her obesity treatment plan along with follow-up of her obesity related diagnoses. Heather Boone is on keeping a food journal and adhering to recommended goals of 1200-1300 calories and 80 grams of protein daily and states she is following her eating plan approximately 85% of the time. Heather Boone states she is walking, doing situps, and arm lifts for 40 minutes 2 times per week.  Today's visit was #: 52 Starting weight: 231 lbs Starting date: 07/13/2017 Today's weight: 227 lbs Today's date: 05/11/2020 Total lbs lost to date: 4 Total lbs lost since last in-office visit: 0  Interim History: Heather Boone is journaling every other day.  She is meeting her calorie and protein goals, and she notes her water intake is adequate. She is status post gastric banding and has had some eructation and GERD recently. She had some fluid removed from her band. She has very little portion restriction wit the band. She reports she may eventually have a Roux-N-Y gastric bypass after she has kids.   Subjective:   1. Vitamin D deficiency Heather Boone's last Vit D level was low at 22.4. She was switched to D3 because D2 was not increasing her level.  2. Insulin resistance Heather Boone has a diagnosis of insulin resistance based on her elevated fasting insulin level >5. She continues to work on diet and exercise to decrease her risk of diabetes. Heather Boone's appetite is controlled on 1.2 mg of Victoza.  Lab Results  Component Value Date   INSULIN 15.6 03/10/2020   INSULIN 13.5 09/29/2019   INSULIN 14.4 12/04/2018   INSULIN 8.5 03/04/2018   INSULIN 14.2 11/01/2017   Lab Results  Component Value Date   HGBA1C 5.2 03/10/2020    3. Other depression, with emotional eating  Heather Boone is on bupropion. She feels food cravings have worseened.  4. At risk for side effects of medication Heather Boone is at risk of side effects from increased dose of bupropion.   Assessment/Plan:   1. Vitamin D deficiency Low Vitamin D level contributes to fatigue and are associated with obesity, breast, and colon cancer. We will refill Vit D3 50,000 IU once weekly for 1 month. Jamariana will follow-up for routine testing of Vitamin D, at least 2-3 times per year to avoid over-replacement.  - Cholecalciferol (VITAMIN D3) 1.25 MG (50000 UT) CAPS; Take 1 Dose by mouth once a week.  Dispense: 4 capsule; Refill: 0  2. Insulin resistance Heather Boone will continue to work on weight loss, exercise, and decreasing simple carbohydrates to help decrease the risk of diabetes. Heather Boone agreed to continue Victoza at 1.2 mg daily. Heather Boone agreed to follow-up with [place] as directed to closely monitor her progress.  3. Other depression, with emotional eating  Behavior modification techniques were discussed today to help Heather Boone deal with her emotional/non-hunger eating behaviors. Heather Boone agreed to increase bupropion to 200 mg BID with no refills. Orders and follow up as documented in patient record.   - buPROPion (WELLBUTRIN SR) 200 MG 12 hr tablet; Take 1 tablet (200 mg total) by mouth 2 (two) times daily.  Dispense: 60 tablet; Refill: 0  4. At risk for side effects of medication Issabelle was given approximately 15 minutes of drug side effect counseling today.  We discussed side effect possibility and risk versus benefits. Heather Boone agreed to the medication and will contact this office if these side effects are intolerable.  Repetitive spaced learning was employed today to elicit superior memory formation and  behavioral change.  5. Class 3 severe obesity with serious comorbidity and body mass index (BMI) of 40.0 to 44.9 in adult, unspecified obesity type (HCC) Heather Boone is currently in the action stage of change. As such, her goal is to continue with weight loss efforts. She has agreed to keeping a food journal and adhering to recommended goals of 1200-1300 calories and 80 grams of  protein daily.   Heather Boone is to journal 5 days out of 7 days per week.  Exercise goals: As is.  Behavioral modification strategies: keeping a strict food journal.  Heather Boone has agreed to follow-up with our clinic in 2 to 3 weeks. She was informed of the importance of frequent follow-up visits to maximize her success with intensive lifestyle modifications for her multiple health conditions.   Objective:   Blood pressure 119/81, pulse 83, temperature 98.3 F (36.8 C), temperature source Oral, height 5\' 3"  (1.6 m), weight 227 lb (103 kg), last menstrual period 04/12/2020, SpO2 98 %. Body mass index is 40.21 kg/m.  General: Cooperative, alert, well developed, in no acute distress. HEENT: Conjunctivae and lids unremarkable. Cardiovascular: Regular rhythm.  Lungs: Normal work of breathing. Neurologic: No focal deficits.   Lab Results  Component Value Date   CREATININE 0.69 03/10/2020   BUN 12 03/10/2020   NA 140 03/10/2020   K 4.3 03/10/2020   CL 105 03/10/2020   CO2 22 03/10/2020   Lab Results  Component Value Date   ALT 7 03/10/2020   AST 14 03/10/2020   ALKPHOS 81 03/10/2020   BILITOT 0.3 03/10/2020   Lab Results  Component Value Date   HGBA1C 5.2 03/10/2020   HGBA1C 5.2 09/29/2019   HGBA1C 5.3 12/04/2018   HGBA1C 5.2 03/04/2018   HGBA1C 5.1 11/01/2017   Lab Results  Component Value Date   INSULIN 15.6 03/10/2020   INSULIN 13.5 09/29/2019   INSULIN 14.4 12/04/2018   INSULIN 8.5 03/04/2018   INSULIN 14.2 11/01/2017   Lab Results  Component Value Date   TSH 2.570 09/29/2019   Lab Results  Component Value Date   CHOL 146 09/29/2019   HDL 65 09/29/2019   LDLCALC 73 09/29/2019   TRIG 32 09/29/2019   Lab Results  Component Value Date   WBC 6.8 03/04/2018   HGB 11.7 03/04/2018   HCT 37.1 03/04/2018   MCV 84 03/04/2018   PLT 383 01/13/2010   No results found for: IRON, TIBC, FERRITIN  Attestation Statements:   Reviewed by clinician on day of visit:  allergies, medications, problem list, medical history, surgical history, family history, social history, and previous encounter notes.   Wilhemena Durie, am acting as Location manager for [employer], FNP-C.  I have reviewed the above documentation for accuracy and completeness, and I agree with the above. -  Georgianne Fick, FNP

## 2020-05-19 ENCOUNTER — Encounter (INDEPENDENT_AMBULATORY_CARE_PROVIDER_SITE_OTHER): Payer: Self-pay

## 2020-06-01 ENCOUNTER — Ambulatory Visit (INDEPENDENT_AMBULATORY_CARE_PROVIDER_SITE_OTHER): Payer: 59 | Admitting: Adult Health

## 2020-06-07 ENCOUNTER — Ambulatory Visit (INDEPENDENT_AMBULATORY_CARE_PROVIDER_SITE_OTHER): Payer: 59 | Admitting: Family Medicine

## 2020-06-07 ENCOUNTER — Other Ambulatory Visit: Payer: Self-pay

## 2020-06-07 ENCOUNTER — Encounter (INDEPENDENT_AMBULATORY_CARE_PROVIDER_SITE_OTHER): Payer: Self-pay | Admitting: Family Medicine

## 2020-06-07 VITALS — BP 103/50 | HR 69 | Temp 98.1°F | Ht 63.0 in | Wt 238.0 lb

## 2020-06-07 DIAGNOSIS — E8881 Metabolic syndrome: Secondary | ICD-10-CM

## 2020-06-07 DIAGNOSIS — Z6841 Body Mass Index (BMI) 40.0 and over, adult: Secondary | ICD-10-CM

## 2020-06-07 DIAGNOSIS — E559 Vitamin D deficiency, unspecified: Secondary | ICD-10-CM | POA: Diagnosis not present

## 2020-06-07 DIAGNOSIS — Z9189 Other specified personal risk factors, not elsewhere classified: Secondary | ICD-10-CM | POA: Diagnosis not present

## 2020-06-07 MED ORDER — VITAMIN D3 1.25 MG (50000 UT) PO CAPS: 1.0000 | ORAL_CAPSULE | ORAL | 0 refills | Status: DC

## 2020-06-07 NOTE — Progress Notes (Signed)
Chief Complaint:   OBESITY Heather Boone is here to discuss her progress with her obesity treatment plan along with follow-up of her obesity related diagnoses. Heather Boone is keeping a food journal and adhering to recommended goals of 1200-1300 calories and 85 grams of protein and states she is following her eating plan approximately 75% of the time. Heather Boone states she is walking/swimming 30-60 minutes 2 times per week.  Today's visit was #: 99 Starting weight: 231 lbs Starting date: 07/13/2017 Today's weight: 238 lbs Today's date: 06/07/2020 Total lbs lost to date: 0 Total lbs lost since last in-office visit: 0  Interim History: Heather Boone has been skipping breakfast at times. She is up >5 lbs of water per the bioimpedance scale. She is up 11 lbs today and is unsure why she has gained weight other than she is having her period. She is journaling daily and reports protein and calories are equal. She does admit to drinking sweet tea and apple juice, but says she is journaling the calories.  Subjective:   Insulin resistance. Heather Boone has a diagnosis of insulin resistance based on her elevated fasting insulin level >5. She continues to work on diet and exercise to decrease her risk of diabetes. Heather Boone denies polyphagia. She is on Victoza 1.2 mg daily. No nausea or constipation.  Lab Results  Component Value Date   INSULIN 15.6 03/10/2020   INSULIN 13.5 09/29/2019   INSULIN 14.4 12/04/2018   INSULIN 8.5 03/04/2018   INSULIN 14.2 11/01/2017   Lab Results  Component Value Date   HGBA1C 5.2 03/10/2020   Vitamin D deficiency. Last Vitamin D was low at 22.4 on 03/10/2020. Heather Boone is on prescription Vitamin D supplementation. She endorses fatigue.  At risk for deficient intake of food. The patient is at a higher than average risk of deficient intake of food due to meal skipping.  Assessment/Plan:   Insulin resistance. Heather Boone will continue to work on weight loss, exercise, and  decreasing simple carbohydrates to help decrease the risk of diabetes. Heather Boone agreed to follow-up with [place] as directed to closely monitor her progress. She will continue Victoza as directed.  Vitamin D deficiency. Low Vitamin D level contributes to fatigue and are associated with obesity, breast, and colon cancer. She was given a refill on her Cholecalciferol (VITAMIN D3) 1.25 MG (50000 UT) CAPS every week #4 with 0 refills and will follow-up for routine testing of Vitamin D, at least 2-3 times per year to avoid over-replacement.   At risk for deficient intake of food. Heather Boone was given approximately 15 minutes of deficit intake of food prevention counseling today. Heather Boone is at risk for eating too few calories based on current food recall. She was encouraged to focus on meeting caloric and protein goals according to her recommended meal plan.   Class 3 severe obesity with serious comorbidity and body mass index (BMI) of 40.0 to 44.9 in adult, unspecified obesity type (Heather Boone).  Heather Boone is currently in the action stage of change. As such, her goal is to continue with weight loss efforts. She has agreed to keeping a food journal and adhering to recommended goals of 1200-1300 calories and 80 grams of protein daily. She will journal all calories and cut out olive oil.   Exercise goals: Heather Boone will continue her current exercise regimen.  Behavioral modification strategies: decreasing simple carbohydrates, decreasing liquid calories and no skipping meals. Discussed importance of journaling all calories- oils, drinks, condiments, etc.   Heather Boone has agreed to follow-up with  our clinic in 2 weeks. She was informed of the importance of frequent follow-up visits to maximize her success with intensive lifestyle modifications for her multiple health conditions.   Objective:   Blood pressure (!) 103/50, pulse 69, temperature 98.1 F (36.7 C), temperature source Oral, height 5\' 3"  (1.6 m), weight 238 lb  (108 kg), SpO2 100 %. Body mass index is 42.16 kg/m.  General: Cooperative, alert, well developed, in no acute distress. HEENT: Conjunctivae and lids unremarkable. Cardiovascular: Regular rhythm.  Lungs: Normal work of breathing. Neurologic: No focal deficits.   Lab Results  Component Value Date   CREATININE 0.69 03/10/2020   BUN 12 03/10/2020   NA 140 03/10/2020   K 4.3 03/10/2020   CL 105 03/10/2020   CO2 22 03/10/2020   Lab Results  Component Value Date   ALT 7 03/10/2020   AST 14 03/10/2020   ALKPHOS 81 03/10/2020   BILITOT 0.3 03/10/2020   Lab Results  Component Value Date   HGBA1C 5.2 03/10/2020   HGBA1C 5.2 09/29/2019   HGBA1C 5.3 12/04/2018   HGBA1C 5.2 03/04/2018   HGBA1C 5.1 11/01/2017   Lab Results  Component Value Date   INSULIN 15.6 03/10/2020   INSULIN 13.5 09/29/2019   INSULIN 14.4 12/04/2018   INSULIN 8.5 03/04/2018   INSULIN 14.2 11/01/2017   Lab Results  Component Value Date   TSH 2.570 09/29/2019   Lab Results  Component Value Date   CHOL 146 09/29/2019   HDL 65 09/29/2019   LDLCALC 73 09/29/2019   TRIG 32 09/29/2019   Lab Results  Component Value Date   WBC 6.8 03/04/2018   HGB 11.7 03/04/2018   HCT 37.1 03/04/2018   MCV 84 03/04/2018   PLT 383 01/13/2010   No results found for: IRON, TIBC, FERRITIN  Attestation Statements:   Reviewed by clinician on day of visit: allergies, medications, problem list, medical history, surgical history, family history, social history, and previous encounter notes.  IMichaelene Song, am acting as Location manager for [employer], FNP   I have reviewed the above documentation for accuracy and completeness, and I agree with the above. - Georgianne Fick, FNP

## 2020-06-22 ENCOUNTER — Ambulatory Visit (INDEPENDENT_AMBULATORY_CARE_PROVIDER_SITE_OTHER): Payer: 59 | Admitting: Family Medicine

## 2020-06-22 ENCOUNTER — Other Ambulatory Visit: Payer: Self-pay

## 2020-06-22 ENCOUNTER — Encounter (INDEPENDENT_AMBULATORY_CARE_PROVIDER_SITE_OTHER): Payer: Self-pay | Admitting: Family Medicine

## 2020-06-22 VITALS — BP 123/94 | HR 65 | Temp 98.3°F | Ht 63.0 in | Wt 239.0 lb

## 2020-06-22 DIAGNOSIS — Z6841 Body Mass Index (BMI) 40.0 and over, adult: Secondary | ICD-10-CM

## 2020-06-22 DIAGNOSIS — F3289 Other specified depressive episodes: Secondary | ICD-10-CM | POA: Diagnosis not present

## 2020-06-22 DIAGNOSIS — Z9189 Other specified personal risk factors, not elsewhere classified: Secondary | ICD-10-CM | POA: Diagnosis not present

## 2020-06-22 DIAGNOSIS — E8881 Metabolic syndrome: Secondary | ICD-10-CM

## 2020-06-22 MED ORDER — VICTOZA 18 MG/3ML ~~LOC~~ SOPN: 1.2000 mg | PEN_INJECTOR | Freq: Every day | SUBCUTANEOUS | 0 refills | Status: DC

## 2020-06-23 ENCOUNTER — Encounter (INDEPENDENT_AMBULATORY_CARE_PROVIDER_SITE_OTHER): Payer: Self-pay | Admitting: Family Medicine

## 2020-06-23 NOTE — Progress Notes (Signed)
Chief Complaint:   OBESITY Heather Boone is here to discuss her progress with her obesity treatment plan along with follow-up of her obesity related diagnoses. Heather Boone is on keeping a food journal and adhering to recommended goals of 1200-1300 calories and 80 grams of protein daily and states she is following her eating plan approximately 75% of the time. Heather Boone states she is walking and swimming for 30-120 minutes 3 times per week.  Today's visit was #: 71 Starting weight: 231 lbs Starting date: 07/13/2017 Today's weight: 239 lbs Today's date: 06/22/2020 Total lbs lost to date: 0 Total lbs lost since last in-office visit: 0  Interim History: Heather Boone notes significant hunger and cravings at night. She is meeting her protein goals.  Subjective:   1. Insulin resistance Heather Boone has a diagnosis of insulin resistance based on her elevated fasting insulin level >5. She notes polyphagia at night. She is on Victoza 0.6 mg daily. She continues to work on diet and exercise to decrease her risk of diabetes.  Lab Results  Component Value Date   INSULIN 15.6 03/10/2020   INSULIN 13.5 09/29/2019   INSULIN 14.4 12/04/2018   INSULIN 8.5 03/04/2018   INSULIN 14.2 11/01/2017   Lab Results  Component Value Date   HGBA1C 5.2 03/10/2020   2. Other depression, with emotional eating  Heather Boone notes cravings at night. She is only taking bupropion daily rather than BID as prescribed.  3. At risk for diabetes mellitus Heather Boone is at higher than average risk for developing diabetes due to her obesity.   Assessment/Plan:   1. Insulin resistance Heather Boone will continue to work on weight loss, exercise, and decreasing simple carbohydrates to help decrease the risk of diabetes. Heather Boone agreed to increase Victoza to 1.2 mg daily and we will refill for 1 month. Heather Boone agreed to follow-up with [place] as directed to closely monitor her progress.  - liraglutide (VICTOZA) 18 MG/3ML SOPN; Inject 0.2 mLs  (1.2 mg total) into the skin daily.  Dispense: 2 pen; Refill: 0  2. Other depression, with emotional eating  Behavior modification techniques were discussed today to help Nieve deal with her emotional/non-hunger eating behaviors. Heather Boone is to take her medications as prescribed. Orders and follow up as documented in patient record.   3. At risk for diabetes mellitus Heather Boone was given approximately 15 minutes of diabetes education and counseling today. We discussed intensive lifestyle modifications today with an emphasis on weight loss as well as increasing exercise and decreasing simple carbohydrates in her diet. We also reviewed medication options with an emphasis on risk versus benefit of those discussed.   Repetitive spaced learning was employed today to elicit superior memory formation and behavioral change.  4. Class 3 severe obesity with serious comorbidity and body mass index (BMI) of 40.0 to 44.9 in adult, unspecified obesity type (HCC) Heather Boone is currently in the action stage of change. As such, her goal is to continue with weight loss efforts. She has agreed to keeping a food journal and adhering to recommended goals of 1200-1300 calories and 80 grams of protein daily.   Handout given today via MyChart: Recipes.  Exercise goals: As is.  Behavioral modification strategies: increasing lean protein intake, decreasing simple carbohydrates and better snacking choices.  Heather Boone has agreed to follow-up with our clinic in 4 weeks. She was informed of the importance of frequent follow-up visits to maximize her success with intensive lifestyle modifications for her multiple health conditions.   Objective:   Blood pressure (!) 123/94,  pulse 65, temperature 98.3 F (36.8 C), temperature source Oral, height 5\' 3"  (1.6 m), weight 239 lb (108.4 kg), SpO2 100 %. Body mass index is 42.34 kg/m.  General: Cooperative, alert, well developed, in no acute distress. HEENT: Conjunctivae and  lids unremarkable. Cardiovascular: Regular rhythm.  Lungs: Normal work of breathing. Neurologic: No focal deficits.   Lab Results  Component Value Date   CREATININE 0.69 03/10/2020   BUN 12 03/10/2020   NA 140 03/10/2020   K 4.3 03/10/2020   CL 105 03/10/2020   CO2 22 03/10/2020   Lab Results  Component Value Date   ALT 7 03/10/2020   AST 14 03/10/2020   ALKPHOS 81 03/10/2020   BILITOT 0.3 03/10/2020   Lab Results  Component Value Date   HGBA1C 5.2 03/10/2020   HGBA1C 5.2 09/29/2019   HGBA1C 5.3 12/04/2018   HGBA1C 5.2 03/04/2018   HGBA1C 5.1 11/01/2017   Lab Results  Component Value Date   INSULIN 15.6 03/10/2020   INSULIN 13.5 09/29/2019   INSULIN 14.4 12/04/2018   INSULIN 8.5 03/04/2018   INSULIN 14.2 11/01/2017   Lab Results  Component Value Date   TSH 2.570 09/29/2019   Lab Results  Component Value Date   CHOL 146 09/29/2019   HDL 65 09/29/2019   LDLCALC 73 09/29/2019   TRIG 32 09/29/2019   Lab Results  Component Value Date   WBC 6.8 03/04/2018   HGB 11.7 03/04/2018   HCT 37.1 03/04/2018   MCV 84 03/04/2018   PLT 383 01/13/2010   No results found for: IRON, TIBC, FERRITIN  Attestation Statements:   Reviewed by clinician on day of visit: allergies, medications, problem list, medical history, surgical history, family history, social history, and previous encounter notes.   Wilhemena Durie, am acting as Location manager for [employer], FNP-C.  I have reviewed the above documentation for accuracy and completeness, and I agree with the above. -  Georgianne Fick, FNP

## 2020-07-11 ENCOUNTER — Other Ambulatory Visit (INDEPENDENT_AMBULATORY_CARE_PROVIDER_SITE_OTHER): Payer: Self-pay | Admitting: Family Medicine

## 2020-07-11 DIAGNOSIS — E559 Vitamin D deficiency, unspecified: Secondary | ICD-10-CM

## 2020-07-12 ENCOUNTER — Encounter (INDEPENDENT_AMBULATORY_CARE_PROVIDER_SITE_OTHER): Payer: Self-pay

## 2020-07-20 ENCOUNTER — Ambulatory Visit (INDEPENDENT_AMBULATORY_CARE_PROVIDER_SITE_OTHER): Payer: 59 | Admitting: Family Medicine

## 2020-08-05 ENCOUNTER — Encounter: Payer: Self-pay | Admitting: Obstetrics & Gynecology

## 2020-08-05 ENCOUNTER — Ambulatory Visit (INDEPENDENT_AMBULATORY_CARE_PROVIDER_SITE_OTHER): Payer: 59 | Admitting: Obstetrics & Gynecology

## 2020-08-05 ENCOUNTER — Other Ambulatory Visit: Payer: Self-pay

## 2020-08-05 VITALS — BP 128/86 | Ht 62.5 in | Wt 250.6 lb

## 2020-08-05 DIAGNOSIS — N979 Female infertility, unspecified: Secondary | ICD-10-CM

## 2020-08-05 DIAGNOSIS — Z01419 Encounter for gynecological examination (general) (routine) without abnormal findings: Secondary | ICD-10-CM | POA: Diagnosis not present

## 2020-08-05 DIAGNOSIS — Z6841 Body Mass Index (BMI) 40.0 and over, adult: Secondary | ICD-10-CM | POA: Diagnosis not present

## 2020-08-05 DIAGNOSIS — N970 Female infertility associated with anovulation: Secondary | ICD-10-CM | POA: Diagnosis not present

## 2020-08-05 MED ORDER — PNV PRENATAL PLUS MULTIVITAMIN 27-1 MG PO TABS: 1.0000 | ORAL_TABLET | Freq: Every day | ORAL | 4 refills | Status: DC

## 2020-08-05 MED ORDER — CLOMIPHENE CITRATE 50 MG PO TABS: ORAL_TABLET | ORAL | 0 refills | Status: DC

## 2020-08-05 NOTE — Progress Notes (Signed)
Heather Boone Aug 22, 1981 [id number]   History:    39 y.o. G1P0A1 Engaged.  Finishing Nursing in 1 year.  RP:  Established patient presenting for annual gyn exam   HPI: Menses regular normal every month.  No pelvic pain.  No pain with IC.  Has been on Clomid before, but not taken x many months.  Fiance had a low Sperm count, seen by Urology and now on Clomid.  Attempting conception with no success, would like to try again on Clomid.  Urine/BMs normal.  Breasts normal.  BMI 45.1. Not exercising regularly.  Health labs Fam MD.   Past medical history,surgical history, family history and social history were all reviewed and documented in the EPIC chart.  Gynecologic History Patient's last menstrual period was 07/30/2020.   Obstetric History OB History  Gravida Para Term Preterm AB Living  1 0       0  SAB TAB Ectopic Multiple Live Births               # Outcome Date GA Lbr Len/2nd Weight Sex Delivery Anes PTL Lv  1 Gravida              ROS: A ROS was performed and pertinent positives and negatives are included in the history.  GENERAL: No fevers or chills. HEENT: No change in vision, no earache, sore throat or sinus congestion. NECK: No pain or stiffness. CARDIOVASCULAR: No chest pain or pressure. No palpitations. PULMONARY: No shortness of breath, cough or wheeze. GASTROINTESTINAL: No abdominal pain, nausea, vomiting or diarrhea, melena or bright red blood per rectum. GENITOURINARY: No urinary frequency, urgency, hesitancy or dysuria. MUSCULOSKELETAL: No joint or muscle pain, no back pain, no recent trauma. DERMATOLOGIC: No rash, no itching, no lesions. ENDOCRINE: No polyuria, polydipsia, no heat or cold intolerance. No recent change in weight. HEMATOLOGICAL: No anemia or easy bruising or bleeding. NEUROLOGIC: No headache, seizures, numbness, tingling or weakness. PSYCHIATRIC: No depression, no loss of interest in normal activity or change in sleep pattern.     Exam:   BP  128/86   Ht 5' 2.5" (1.588 m)   Wt 250 lb 9.6 oz (113.7 kg)   LMP 07/30/2020   BMI 45.10 kg/m   Body mass index is 45.1 kg/m.  General appearance : Well developed well nourished female. No acute distress HEENT: Eyes: no retinal hemorrhage or exudates,  Neck supple, trachea midline, no carotid bruits, no thyroidmegaly Lungs: Clear to auscultation, no rhonchi or wheezes, or rib retractions  Heart: Regular rate and rhythm, no murmurs or gallops Breast:Examined in sitting and supine position were symmetrical in appearance, no palpable masses or tenderness,  no skin retraction, no nipple inversion, no nipple discharge, no skin discoloration, no axillary or supraclavicular lymphadenopathy Abdomen: no palpable masses or tenderness, no rebound or guarding Extremities: no edema or skin discoloration or tenderness  Pelvic: Vulva: Normal             Vagina: No gross lesions or discharge  Cervix: No gross lesions or discharge.  Pap reflex done.  Uterus AV, normal size, shape and consistency, non-tender and mobile  Adnexa  Without masses or tenderness  Anus: Normal   Assessment/Plan:  39 y.o. female for annual exam   1. Encounter for routine gynecological examination with Papanicolaou smear of cervix Normal gynecologic exam.  Pap reflex done.  Breast exam normal.  Health labs with family physician.  2. Primary female infertility Primary infertility with probably both female and female factors.  Husband with low sperm count on Clomid.  Patient with oligo ovulation, would like to start back on Clomid.  Usage, benefits and risks reviewed.  Clomid 50 mg/tab 1 tablet per mouth daily from day 3 to day 7 of the cycle.  Prescription sent to pharmacy.  Prenatal vitamins represcribed.  Importance of weight loss discussed.  3. Class 3 severe obesity due to excess calories without serious comorbidity with body mass index (BMI) of 45.0 to 49.9 in adult [facility]) Low calorie/carb diet recommended.  Aerobic  activities 5 times a week and light weightlifting every 2 days.  4. Anovulatory - clomiPHENE (CLOMID) 50 MG tablet; TAKE ONE TABLET DAILY ON DAYS 3-7 OF CYCLE.  Other orders - Prenatal Vit-Fe Fumarate-FA (PNV PRENATAL PLUS MULTIVITAMIN) 27-1 MG TABS; Take 1 tablet by mouth daily.  [name], 10:58 AM 08/05/2020

## 2020-08-05 NOTE — Addendum Note (Signed)
Addended by: Thurnell Garbe A on: 08/05/2020 02:25 PM   Modules accepted: Orders

## 2020-08-11 LAB — PAP IG W/ RFLX HPV ASCU

## 2020-08-11 LAB — HUMAN PAPILLOMAVIRUS, HIGH RISK: HPV DNA High Risk: NOT DETECTED

## 2020-08-12 ENCOUNTER — Other Ambulatory Visit: Payer: Self-pay

## 2020-08-12 MED ORDER — TINIDAZOLE 500 MG PO TABS
ORAL_TABLET | ORAL | 0 refills | Status: DC
Start: 2020-08-12 — End: 2020-08-24

## 2020-08-24 ENCOUNTER — Other Ambulatory Visit: Payer: Self-pay

## 2020-08-24 MED ORDER — TINIDAZOLE 500 MG PO TABS: ORAL_TABLET | ORAL | 1 refills | Status: DC

## 2020-09-30 NOTE — Telephone Encounter (Signed)
I will send [facility] referral request.  Ok to send Rx for Clomid as she requested?

## 2020-10-01 ENCOUNTER — Other Ambulatory Visit: Payer: Self-pay

## 2020-10-01 ENCOUNTER — Telehealth: Payer: Self-pay | Admitting: *Deleted

## 2020-10-01 DIAGNOSIS — N970 Female infertility associated with anovulation: Secondary | ICD-10-CM

## 2020-10-01 MED ORDER — CLOMIPHENE CITRATE 50 MG PO TABS: ORAL_TABLET | ORAL | 0 refills | Status: DC

## 2020-10-01 NOTE — Telephone Encounter (Signed)
-----   Message from Ramond Craver, [place] sent at 09/30/2020  2:05 PM EDT ----- Regarding: referral to [facility] Per Dr. Marguerita Merles "Please refer to Fertility specialist.  Will need evaluation for Ovarian Hyperstimulation of Egg donor."  Patient knows and agreed. Thanks

## 2020-10-01 NOTE — Telephone Encounter (Signed)
Office notes faxed to [facility] they will call to schedule.

## 2020-10-18 ENCOUNTER — Ambulatory Visit (INDEPENDENT_AMBULATORY_CARE_PROVIDER_SITE_OTHER): Payer: 59 | Admitting: Family Medicine

## 2020-10-18 ENCOUNTER — Encounter (INDEPENDENT_AMBULATORY_CARE_PROVIDER_SITE_OTHER): Payer: Self-pay | Admitting: Family Medicine

## 2020-10-18 ENCOUNTER — Other Ambulatory Visit: Payer: Self-pay

## 2020-10-18 VITALS — BP 95/62 | HR 79 | Temp 97.7°F | Ht 63.0 in | Wt 253.0 lb

## 2020-10-18 DIAGNOSIS — E88819 Insulin resistance, unspecified: Secondary | ICD-10-CM

## 2020-10-18 DIAGNOSIS — F3289 Other specified depressive episodes: Secondary | ICD-10-CM | POA: Diagnosis not present

## 2020-10-18 DIAGNOSIS — E559 Vitamin D deficiency, unspecified: Secondary | ICD-10-CM | POA: Diagnosis not present

## 2020-10-18 DIAGNOSIS — Z6841 Body Mass Index (BMI) 40.0 and over, adult: Secondary | ICD-10-CM

## 2020-10-18 DIAGNOSIS — E8881 Metabolic syndrome: Secondary | ICD-10-CM

## 2020-10-18 DIAGNOSIS — Z9189 Other specified personal risk factors, not elsewhere classified: Secondary | ICD-10-CM | POA: Diagnosis not present

## 2020-10-18 MED ORDER — VITAMIN D3 1.25 MG (50000 UT) PO CAPS: 1.0000 | ORAL_CAPSULE | ORAL | 0 refills | Status: DC

## 2020-10-18 MED ORDER — BUPROPION HCL ER (SR) 200 MG PO TB12: 200.0000 mg | ORAL_TABLET | Freq: Two times a day (BID) | ORAL | 0 refills | Status: DC

## 2020-10-18 MED ORDER — OZEMPIC (0.25 OR 0.5 MG/DOSE) 2 MG/1.5ML ~~LOC~~ SOPN: 0.2500 mg | PEN_INJECTOR | SUBCUTANEOUS | 0 refills | Status: DC

## 2020-10-19 NOTE — Progress Notes (Signed)
Chief Complaint:   OBESITY Heather Boone is here to discuss her progress with her obesity treatment plan along with follow-up of her obesity related diagnoses. Heather Boone is keeping a food journal and adhering to recommended goals of 1200-1300 calories and 80 grams of protein and states she is following her eating plan approximately 60% of the time. Heather Boone states she is walking 30 minutes 3 times per week.  Today's visit was #: 94 Starting weight: 231 lbs Starting date: 07/13/2017 Today's weight: 253 lbs Today's date: 10/18/2020 Total lbs lost to date: 0 Total lbs lost since last in-office visit: 0  Interim History: Heather Boone has not had an OV since June 22, 2020. She is up 14 lbs today. She has been off plan due caring for her mother who had a stroke. 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 She reports she has been off plan the last 3 months but she has tried to make good choices. She would like to try to have a baby using IVF and needs to lose down to a 40 BMI (225 lbs) to be eligible.   Subjective:   Other depression, with emotional eating. Heather Boone notes stress eating over the summer - she has had many stressors. She has been off bupropion.  Insulin resistance. Heather Boone notes polyphagia. She has been off of Victoza; she is interested in [place].  Lab Results  Component Value Date   INSULIN 15.6 03/10/2020   INSULIN 13.5 09/29/2019   INSULIN 14.4 12/04/2018   INSULIN 8.5 03/04/2018   INSULIN 14.2 11/01/2017   Lab Results  Component Value Date   HGBA1C 5.2 03/10/2020   Vitamin D deficiency. Vitamin D level low at 22.4 on 03/10/2020. Heather Boone has been off of prescription Vitamin D.   Ref. Range 03/10/2020 11:46  Vitamin D, 25-Hydroxy Latest Ref Range: 30.0 - 100.0 ng/mL 22.4 (L)   At risk for side effect of medication. Heather Boone is at risk of side effects from starting Ozempic.  Assessment/Plan:   Other depression, with emotional eating. B Refill was  given for buPROPion (WELLBUTRIN SR) 200 MG 12 hr tablet BID. Heather Boone will start 200 mg daily for one week and then take BID.  Insulin resistance. New prescription was given for Semaglutide,0.25 or 0.5MG /DOS, (OZEMPIC, 0.25 OR 0.5 MG/DOSE,) 2 MG/1.5ML SOPN 0.25 mg weekly #1 pen with 0 refills. We will do prior authorization for [facility] after maxing out dose of Ozempic.  Vitamin D deficiency. . She was given a refill on her Cholecalciferol (VITAMIN D3) 1.25 MG (50000 UT) CAPS every week #4 with 0 refills and will follow-up for routine testing of Vitamin D, at least 2-3 times per year to avoid over-replacement.   At risk for side effect of medication. Heather Boone was given approximately 15 minutes of drug side effect counseling today due to start of Ozempic and possibility of nausea/constipation.  We discussed side effect possibility and risk versus benefits. Heather Boone agreed to the medication and will contact this office if these side effects are intolerable.  Repetitive spaced learning was employed today to elicit superior memory formation and behavioral change.  Class 3 severe obesity with serious comorbidity and body mass index (BMI) of 45.0 to 49.9 in adult, unspecified obesity type (Heather Boone).  Heather Boone is currently in the action stage of change. As such, her goal is to continue with weight loss efforts. She has agreed to the Category 2 Plan with lunch and breakfast options.   Exercise goals: Heather Boone will continue  her current exercise regimen.   Behavioral modification strategies: increasing lean protein intake, decreasing simple carbohydrates and meal planning and cooking strategies.  Heather Boone has agreed to follow-up with our clinic in 2 weeks.   Objective:   Blood pressure 95/62, pulse 79, temperature 97.7 F (36.5 C), height 5\' 3"  (1.6 m), weight 253 lb (114.8 kg), SpO2 98 %. Body mass index is 44.82 kg/m.  General: Cooperative, alert, well developed, in no acute distress. HEENT:  Conjunctivae and lids unremarkable. Cardiovascular: Regular rhythm.  Lungs: Normal work of breathing. Neurologic: No focal deficits.   Lab Results  Component Value Date   CREATININE 0.69 03/10/2020   BUN 12 03/10/2020   NA 140 03/10/2020   K 4.3 03/10/2020   CL 105 03/10/2020   CO2 22 03/10/2020   Lab Results  Component Value Date   ALT 7 03/10/2020   AST 14 03/10/2020   ALKPHOS 81 03/10/2020   BILITOT 0.3 03/10/2020   Lab Results  Component Value Date   HGBA1C 5.2 03/10/2020   HGBA1C 5.2 09/29/2019   HGBA1C 5.3 12/04/2018   HGBA1C 5.2 03/04/2018   HGBA1C 5.1 11/01/2017   Lab Results  Component Value Date   INSULIN 15.6 03/10/2020   INSULIN 13.5 09/29/2019   INSULIN 14.4 12/04/2018   INSULIN 8.5 03/04/2018   INSULIN 14.2 11/01/2017   Lab Results  Component Value Date   TSH 2.570 09/29/2019   Lab Results  Component Value Date   CHOL 146 09/29/2019   HDL 65 09/29/2019   LDLCALC 73 09/29/2019   TRIG 32 09/29/2019   Lab Results  Component Value Date   WBC 6.8 03/04/2018   HGB 11.7 03/04/2018   HCT 37.1 03/04/2018   MCV 84 03/04/2018   PLT 383 01/13/2010   No results found for: IRON, TIBC, FERRITIN  Attestation Statements:   Reviewed by clinician on day of visit: allergies, medications, problem list, medical history, surgical history, family history, social history, and previous encounter notes.  Heather Boone, am acting as Location manager for [employer], FNP-C   I have reviewed the above documentation for accuracy and completeness, and I agree with the above. -  Georgianne Fick, FNP

## 2020-10-20 ENCOUNTER — Encounter (INDEPENDENT_AMBULATORY_CARE_PROVIDER_SITE_OTHER): Payer: Self-pay | Admitting: Family Medicine

## 2020-10-21 ENCOUNTER — Encounter (INDEPENDENT_AMBULATORY_CARE_PROVIDER_SITE_OTHER): Payer: Self-pay | Admitting: Family Medicine

## 2020-10-21 ENCOUNTER — Other Ambulatory Visit (INDEPENDENT_AMBULATORY_CARE_PROVIDER_SITE_OTHER): Payer: Self-pay | Admitting: Family Medicine

## 2020-10-21 DIAGNOSIS — E88819 Insulin resistance, unspecified: Secondary | ICD-10-CM

## 2020-10-21 DIAGNOSIS — E8881 Metabolic syndrome: Secondary | ICD-10-CM

## 2020-10-21 MED ORDER — INSULIN PEN NEEDLE 32G X 4 MM MISC: 0 refills | Status: DC

## 2020-10-21 MED ORDER — VICTOZA 18 MG/3ML ~~LOC~~ SOPN: 0.6000 mg | PEN_INJECTOR | Freq: Every day | SUBCUTANEOUS | 0 refills | Status: DC

## 2020-10-25 NOTE — Telephone Encounter (Signed)
Pt was denied on 10/21/2020. She wants guidance on what dosage to take.

## 2020-10-27 ENCOUNTER — Encounter (INDEPENDENT_AMBULATORY_CARE_PROVIDER_SITE_OTHER): Payer: Self-pay

## 2020-11-01 ENCOUNTER — Ambulatory Visit (INDEPENDENT_AMBULATORY_CARE_PROVIDER_SITE_OTHER): Payer: 59 | Admitting: Family Medicine

## 2020-11-04 ENCOUNTER — Other Ambulatory Visit: Payer: Self-pay

## 2020-11-04 ENCOUNTER — Ambulatory Visit (INDEPENDENT_AMBULATORY_CARE_PROVIDER_SITE_OTHER): Payer: 59 | Admitting: Family Medicine

## 2020-11-04 ENCOUNTER — Encounter (INDEPENDENT_AMBULATORY_CARE_PROVIDER_SITE_OTHER): Payer: Self-pay | Admitting: Family Medicine

## 2020-11-04 VITALS — BP 113/71 | HR 72 | Temp 97.9°F | Ht 63.0 in | Wt 256.0 lb

## 2020-11-04 DIAGNOSIS — Z6841 Body Mass Index (BMI) 40.0 and over, adult: Secondary | ICD-10-CM | POA: Diagnosis not present

## 2020-11-04 DIAGNOSIS — F3289 Other specified depressive episodes: Secondary | ICD-10-CM

## 2020-11-04 DIAGNOSIS — E8881 Metabolic syndrome: Secondary | ICD-10-CM

## 2020-11-04 NOTE — Progress Notes (Addendum)
Chief Complaint:   OBESITY Heather Boone is here to discuss her progress with her obesity treatment plan along with follow-up of her obesity related diagnoses. Heather Boone is on the Category 2 Plan with breakfast and lunch options and states she is following her eating plan approximately 85% of the time. Heather Boone states she is walking for 30 minutes 3 times per week.  Today's visit was #: 70 Starting weight: 231 lbs Starting date: 07/13/2017 Today's weight: 256 lbs Today's date: 11/04/2020 Total lbs lost to date: 0 Total lbs lost since last in-office visit: 0  Interim History: Heather Boone notes some stress eating recently. She notes she is now better situated to following the plan because has gotten the food she needs to stick to plan. She needs to lose to 40 BMI to undergo IVF.   Subjective:   1. Insulin resistance  We have not been able to get insurance coverage for Victoza, Saxenda, or Ozempic. She has some Victoza left from when it was covered.   Lab Results  Component Value Date   INSULIN 15.6 03/10/2020   INSULIN 13.5 09/29/2019   INSULIN 14.4 12/04/2018   INSULIN 8.5 03/04/2018   INSULIN 14.2 11/01/2017   Lab Results  Component Value Date   HGBA1C 5.2 03/10/2020   2. Other depression, with emotional eating  Heather Boone is trying to get IVF but needs to lose weight. She notes increased stress eating. She is on bupropion and feels it helps with her mood overall.  Assessment/Plan:   1. Insulin resistance We will try to get Ozempic covered again. If we cannot, I may send her to dr. Owens Shark to possibly start phentermine.   2. Other depression, with emotional eating   Heather Boone will continue bupropion, and she will keep foods out of the house that she stress eats.   3. Class 3 severe obesity with serious comorbidity and body mass index (BMI) of 45.0 to 49.9 in adult, unspecified obesity type (HCC) Heather Boone is currently in the action stage of change. As such, her goal is to continue  with weight loss efforts. She has agreed to the Category 2 Plan.   Exercise goals: As is.  Behavioral modification strategies: decreasing simple carbohydrates.  Heather Boone has agreed to follow-up with our clinic in 3 weeks.   Objective:   Blood pressure 113/71, pulse 72, temperature 97.9 F (36.6 C), temperature source Oral, height 5\' 3"  (1.6 m), weight 256 lb (116.1 kg), SpO2 100 %. Body mass index is 45.35 kg/m.  General: Cooperative, alert, well developed, in no acute distress. HEENT: Conjunctivae and lids unremarkable. Cardiovascular: Regular rhythm.  Lungs: Normal work of breathing. Neurologic: No focal deficits.   Lab Results  Component Value Date   CREATININE 0.69 03/10/2020   BUN 12 03/10/2020   NA 140 03/10/2020   K 4.3 03/10/2020   CL 105 03/10/2020   CO2 22 03/10/2020   Lab Results  Component Value Date   ALT 7 03/10/2020   AST 14 03/10/2020   ALKPHOS 81 03/10/2020   BILITOT 0.3 03/10/2020   Lab Results  Component Value Date   HGBA1C 5.2 03/10/2020   HGBA1C 5.2 09/29/2019   HGBA1C 5.3 12/04/2018   HGBA1C 5.2 03/04/2018   HGBA1C 5.1 11/01/2017   Lab Results  Component Value Date   INSULIN 15.6 03/10/2020   INSULIN 13.5 09/29/2019   INSULIN 14.4 12/04/2018   INSULIN 8.5 03/04/2018   INSULIN 14.2 11/01/2017   Lab Results  Component Value Date   TSH 2.570  09/29/2019   Lab Results  Component Value Date   CHOL 146 09/29/2019   HDL 65 09/29/2019   LDLCALC 73 09/29/2019   TRIG 32 09/29/2019   Lab Results  Component Value Date   WBC 6.8 03/04/2018   HGB 11.7 03/04/2018   HCT 37.1 03/04/2018   MCV 84 03/04/2018   PLT 383 01/13/2010   No results found for: IRON, TIBC, FERRITIN  Attestation Statements:   Reviewed by clinician on day of visit: allergies, medications, problem list, medical history, surgical history, family history, social history, and previous encounter notes.   Wilhemena Durie, am acting as Location manager for [employer], FNP-C.  I have reviewed the above documentation for accuracy and completeness, and I agree with the above. -  Georgianne Fick, FNP

## 2020-11-08 ENCOUNTER — Encounter (INDEPENDENT_AMBULATORY_CARE_PROVIDER_SITE_OTHER): Payer: Self-pay

## 2020-11-08 ENCOUNTER — Encounter (INDEPENDENT_AMBULATORY_CARE_PROVIDER_SITE_OTHER): Payer: Self-pay | Admitting: Family Medicine

## 2020-11-27 ENCOUNTER — Encounter (INDEPENDENT_AMBULATORY_CARE_PROVIDER_SITE_OTHER): Payer: Self-pay | Admitting: Family Medicine

## 2020-11-29 ENCOUNTER — Ambulatory Visit (INDEPENDENT_AMBULATORY_CARE_PROVIDER_SITE_OTHER): Payer: 59 | Admitting: Family Medicine

## 2021-01-31 ENCOUNTER — Ambulatory Visit (INDEPENDENT_AMBULATORY_CARE_PROVIDER_SITE_OTHER): Payer: 59 | Admitting: Family Medicine

## 2021-01-31 ENCOUNTER — Other Ambulatory Visit: Payer: Self-pay

## 2021-01-31 ENCOUNTER — Other Ambulatory Visit (INDEPENDENT_AMBULATORY_CARE_PROVIDER_SITE_OTHER): Payer: Self-pay | Admitting: Family Medicine

## 2021-01-31 ENCOUNTER — Encounter (INDEPENDENT_AMBULATORY_CARE_PROVIDER_SITE_OTHER): Payer: Self-pay | Admitting: Family Medicine

## 2021-01-31 ENCOUNTER — Telehealth (INDEPENDENT_AMBULATORY_CARE_PROVIDER_SITE_OTHER): Payer: Self-pay

## 2021-01-31 VITALS — BP 110/73 | HR 74 | Temp 97.4°F | Ht 63.0 in | Wt 260.0 lb

## 2021-01-31 DIAGNOSIS — Z9189 Other specified personal risk factors, not elsewhere classified: Secondary | ICD-10-CM

## 2021-01-31 DIAGNOSIS — E559 Vitamin D deficiency, unspecified: Secondary | ICD-10-CM

## 2021-01-31 DIAGNOSIS — Z6841 Body Mass Index (BMI) 40.0 and over, adult: Secondary | ICD-10-CM

## 2021-01-31 DIAGNOSIS — E8881 Metabolic syndrome: Secondary | ICD-10-CM

## 2021-01-31 DIAGNOSIS — F3289 Other specified depressive episodes: Secondary | ICD-10-CM

## 2021-01-31 MED ORDER — VITAMIN D3 1.25 MG (50000 UT) PO CAPS: 1.0000 | ORAL_CAPSULE | ORAL | 0 refills | Status: DC

## 2021-01-31 MED ORDER — BUPROPION HCL ER (SR) 200 MG PO TB12: 200.0000 mg | ORAL_TABLET | Freq: Two times a day (BID) | ORAL | 0 refills | Status: DC

## 2021-01-31 MED ORDER — BD PEN NEEDLE NANO 2ND GEN 32G X 4 MM MISC: 0 refills | Status: DC

## 2021-01-31 MED ORDER — SAXENDA 18 MG/3ML ~~LOC~~ SOPN: 3.0000 mg | PEN_INJECTOR | Freq: Every day | SUBCUTANEOUS | 0 refills | Status: DC

## 2021-01-31 MED ORDER — METFORMIN HCL 500 MG PO TABS: 500.0000 mg | ORAL_TABLET | Freq: Three times a day (TID) | ORAL | 0 refills | Status: DC

## 2021-01-31 NOTE — Progress Notes (Signed)
Chief Complaint:   OBESITY Heather Boone is here to discuss her progress with her obesity treatment plan along with follow-up of her obesity related diagnoses. Heather Boone is on the Category 2 Plan and states she is following her eating plan approximately 85% of the time. Heather Boone states she is walking for 30-40 minutes 4 times per week.  Today's visit was #: 55 Starting weight: 231 lbs Starting date: 07/13/2017 Today's weight: 260 lbs Today's date: 01/31/2021 Total lbs lost to date: 0 Total lbs lost since last in-office visit: 0  Interim History: Heather Boone has not had an office visit since November 2021. Her mother has stage IV lung cancer. She is in nursing school and working full time. She is up 4 lbs since November 2021.  Subjective:   1. Other depression, with emotional eating  Heather Boone has been off of bupropion for a few months. Her mood is stable despite life stressors.  2. Vitamin D deficiency Heather Boone's last Vit D level was low at 22.4 on 03/10/2020. She has been off of Vit D supplementation.  3. Insulin resistance Heather Boone's last A1c was 5.2 and fasting insulin was high at 15.6. She has a consult scheduled for IVF next month. She continues to work on diet and exercise to decrease her risk of diabetes.  Lab Results  Component Value Date   INSULIN 15.6 03/10/2020   INSULIN 13.5 09/29/2019   INSULIN 14.4 12/04/2018   INSULIN 8.5 03/04/2018   INSULIN 14.2 11/01/2017   Lab Results  Component Value Date   HGBA1C 5.2 03/10/2020   4. At risk for adverse drug reaction Heather Boone is at risk for drug side effects due to starting Saxenda.  Assessment/Plan:   1. Other depression, with emotional eating   We will refill bupropion for 1 month. Orders and follow up as documented in patient record.   - buPROPion (WELLBUTRIN SR) 200 MG 12 hr tablet; Take 1 tablet (200 mg total) by mouth 2 (two) times daily.  Dispense: 60 tablet; Refill: 0  2. Vitamin D deficiency  We will refill  prescription Vitamin D for 1 month, and we will recheck labs in 2 months. Heather Boone will follow-up for routine testing of Vitamin D, at least 2-3 times per year to avoid over-replacement.  - Cholecalciferol (VITAMIN D3) 1.25 MG (50000 UT) CAPS; Take 1 Dose by mouth once a week.  Dispense: 4 capsule; Refill: 0  3. Insulin resistance . We will refill metformin for 1 month. Heather Boone agreed to follow-up with [place] as directed to closely monitor her progress.  - metFORMIN (GLUCOPHAGE) 500 MG tablet; Take 1 tablet (500 mg total) by mouth 3 (three) times daily.  Dispense: 90 tablet; Refill: 0  4. At risk for adverse drug reaction Heather Boone was given approximately 15 minutes of drug side effect counseling today.  We discussed side effect possibility and risk versus benefits. Heather Boone agreed to the medication and will contact this office if these side effects are intolerable.  Repetitive spaced learning was employed today to elicit superior memory formation and behavioral change.  5. Class 3 severe obesity with serious comorbidity and body mass index (BMI) of 45.0 to 49.9 in adult, unspecified obesity type (HCC) Heather Boone is currently in the action stage of change. As such, her goal is to continue with weight loss efforts. She has agreed to following a lower carbohydrate, vegetable and lean protein rich diet plan.   We discussed various medication options to help Heather Boone with her weight loss efforts and we both agreed  to start Saxenda 3.0 mg daily and nano needles #100 with no refills. She may take 0.6 mg and my increase after 1 week to 1.2 mg daily if no nausea.  - Liraglutide -Weight Management (SAXENDA) 18 MG/3ML SOPN; Inject 3 mg into the skin daily.  Dispense: 15 mL; Refill: 0 - Insulin Pen Needle (BD PEN NEEDLE NANO 2ND GEN) 32G X 4 MM MISC; Use 1 needle daily to inject Saxenda.  Dispense: 100 each; Refill: 0  Exercise goals: No exercise has been prescribed at this time.  Behavioral modification  strategies: increasing lean protein intake, decreasing simple carbohydrates and meal planning and cooking strategies.  Heather Boone has agreed to follow-up with our clinic in 2 weeks.   Objective:   Blood pressure 110/73, pulse 74, temperature (!) 97.4 F (36.3 C), height 5\' 3"  (1.6 m), weight 260 lb (117.9 kg), SpO2 97 %. Body mass index is 46.06 kg/m.  General: Cooperative, alert, well developed, in no acute distress. HEENT: Conjunctivae and lids unremarkable. Cardiovascular: Regular rhythm.  Lungs: Normal work of breathing. Neurologic: No focal deficits.   Lab Results  Component Value Date   CREATININE 0.69 03/10/2020   BUN 12 03/10/2020   NA 140 03/10/2020   K 4.3 03/10/2020   CL 105 03/10/2020   CO2 22 03/10/2020   Lab Results  Component Value Date   ALT 7 03/10/2020   AST 14 03/10/2020   ALKPHOS 81 03/10/2020   BILITOT 0.3 03/10/2020   Lab Results  Component Value Date   HGBA1C 5.2 03/10/2020   HGBA1C 5.2 09/29/2019   HGBA1C 5.3 12/04/2018   HGBA1C 5.2 03/04/2018   HGBA1C 5.1 11/01/2017   Lab Results  Component Value Date   INSULIN 15.6 03/10/2020   INSULIN 13.5 09/29/2019   INSULIN 14.4 12/04/2018   INSULIN 8.5 03/04/2018   INSULIN 14.2 11/01/2017   Lab Results  Component Value Date   TSH 2.570 09/29/2019   Lab Results  Component Value Date   CHOL 146 09/29/2019   HDL 65 09/29/2019   LDLCALC 73 09/29/2019   TRIG 32 09/29/2019   Lab Results  Component Value Date   WBC 6.8 03/04/2018   HGB 11.7 03/04/2018   HCT 37.1 03/04/2018   MCV 84 03/04/2018   PLT 383 01/13/2010   No results found for: IRON, TIBC, FERRITIN  Attestation Statements:   Reviewed by clinician on day of visit: allergies, medications, problem list, medical history, surgical history, family history, social history, and previous encounter notes.   Wilhemena Durie, am acting as Location manager for [employer], FNP-C.  I have reviewed the above documentation for accuracy and  completeness, and I agree with the above. -  Georgianne Fick, FNP

## 2021-01-31 NOTE — Telephone Encounter (Signed)
PA has been initiated via [email or web address] for Saxenda.  Tasnim Sanpedro (Key: [id number]) Saxenda 18MG Fayne Mediate pen-injectors   Form: Charity fundraiser PA Form (2017 NCPDP) Determination: Wait for Determination Please wait for [employer] NCPDP 2017 to return a determination.

## 2021-02-01 ENCOUNTER — Encounter (INDEPENDENT_AMBULATORY_CARE_PROVIDER_SITE_OTHER): Payer: Self-pay | Admitting: Family Medicine

## 2021-02-02 ENCOUNTER — Encounter (INDEPENDENT_AMBULATORY_CARE_PROVIDER_SITE_OTHER): Payer: Self-pay | Admitting: Family Medicine

## 2021-02-09 ENCOUNTER — Telehealth (INDEPENDENT_AMBULATORY_CARE_PROVIDER_SITE_OTHER): Payer: Self-pay | Admitting: Family Medicine

## 2021-02-09 NOTE — Telephone Encounter (Signed)
Pt was contacted on 02/02/21 with instructions. No response to date. See below  Hi [name],   According to this form you will need to have tried and failed 2 formulary products. Please reach out to your insurance company to find out what is on their formulary and let us know.    Thank you, Christan LPN  Last read by Tacy Learn at 6:32 PM on 02/02/2021.

## 2021-02-09 NOTE — Telephone Encounter (Signed)
Last OV with Dawn 

## 2021-02-09 NOTE — Telephone Encounter (Signed)
Cover my meds called for pa on Saxenda Ref# [id number] call [phone]

## 2021-02-10 ENCOUNTER — Encounter (INDEPENDENT_AMBULATORY_CARE_PROVIDER_SITE_OTHER): Payer: Self-pay | Admitting: Family Medicine

## 2021-02-10 DIAGNOSIS — Z3169 Encounter for other general counseling and advice on procreation: Secondary | ICD-10-CM | POA: Diagnosis not present

## 2021-02-14 ENCOUNTER — Ambulatory Visit (INDEPENDENT_AMBULATORY_CARE_PROVIDER_SITE_OTHER): Payer: 59 | Admitting: Family Medicine

## 2021-02-14 ENCOUNTER — Encounter (INDEPENDENT_AMBULATORY_CARE_PROVIDER_SITE_OTHER): Payer: Self-pay | Admitting: Family Medicine

## 2021-02-14 ENCOUNTER — Other Ambulatory Visit: Payer: Self-pay

## 2021-02-14 VITALS — BP 110/71 | HR 83 | Temp 98.0°F | Ht 63.0 in | Wt 263.0 lb

## 2021-02-14 DIAGNOSIS — F3289 Other specified depressive episodes: Secondary | ICD-10-CM

## 2021-02-14 DIAGNOSIS — Z9189 Other specified personal risk factors, not elsewhere classified: Secondary | ICD-10-CM | POA: Diagnosis not present

## 2021-02-14 DIAGNOSIS — E8881 Metabolic syndrome: Secondary | ICD-10-CM | POA: Diagnosis not present

## 2021-02-14 DIAGNOSIS — Z6841 Body Mass Index (BMI) 40.0 and over, adult: Secondary | ICD-10-CM

## 2021-02-14 DIAGNOSIS — E559 Vitamin D deficiency, unspecified: Secondary | ICD-10-CM | POA: Diagnosis not present

## 2021-02-14 MED ORDER — METFORMIN HCL 500 MG PO TABS: 500.0000 mg | ORAL_TABLET | Freq: Three times a day (TID) | ORAL | 0 refills | Status: DC

## 2021-02-14 MED ORDER — VITAMIN D3 1.25 MG (50000 UT) PO CAPS: 1.0000 | ORAL_CAPSULE | ORAL | 0 refills | Status: DC

## 2021-02-14 MED ORDER — BUPROPION HCL ER (SR) 200 MG PO TB12: 200.0000 mg | ORAL_TABLET | Freq: Two times a day (BID) | ORAL | 0 refills | Status: DC

## 2021-02-22 NOTE — Progress Notes (Signed)
Chief Complaint:   OBESITY Heather Boone is here to discuss her progress with her obesity treatment plan along with follow-up of her obesity related diagnoses.   Today's visit was #: 12 Starting weight: 231 lbs Starting date: 07/13/2017 Today's weight: 263 lbs Today's date: 02/14/2021 Total lbs lost to date: +32 lbs Body mass index is 46.59 kg/m.   Interim History:  Heather Boone says she got off track over the last couple of weeks.  She has been skipping breakfast and lunch and not eating them as snacks later in the day.  This is her first office visit with me.  She was previously seen by Heather Bathe, FNP.  Plan:  No skipping meals.  Handouts on mindful eating and strategies to deal with emotional eating reviewed with her today.  Current Meal Plan: following a lower carbohydrate, vegetable and lean protein rich diet plan for 75% of the time.  Current Exercise Plan: Walking for 30 minutes 7 times per week.  Assessment/Plan:   Medications Discontinued During This Encounter  Medication Reason  . buPROPion (WELLBUTRIN SR) 200 MG 12 hr tablet Reorder  . Cholecalciferol (VITAMIN D3) 1.25 MG (50000 UT) CAPS Reorder  . metFORMIN (GLUCOPHAGE) 500 MG tablet Reorder     Meds ordered this encounter  Medications  . buPROPion (WELLBUTRIN SR) 200 MG 12 hr tablet    Sig: Take 1 tablet (200 mg total) by mouth 2 (two) times daily.    Dispense:  60 tablet    Refill:  0  . Cholecalciferol (VITAMIN D3) 1.25 MG (50000 UT) CAPS    Sig: Take 1 Dose by mouth once a week.    Dispense:  4 capsule    Refill:  0  . metFORMIN (GLUCOPHAGE) 500 MG tablet    Sig: Take 1 tablet (500 mg total) by mouth 3 (three) times daily.    Dispense:  90 tablet    Refill:  0     1. Insulin resistance Not at goal. Goal is HgbA1c < 5.7, fasting insulin closer to 5.  Medication: metformin 500 mg three times daily.  Heather Boone was not approved by her insurance and she did not start it.  She had Victoza in the past.  Insulin  level 15.6.  Plan:  She will continue to focus on protein-rich, low simple carbohydrate foods. We reviewed the importance of hydration, regular exercise for stress reduction, and restorative sleep.  She will call her insurance / check on formulary for GLP-1 coverage and let Heather Boone know at her next visit.  Wrote down information for her to ask her insurance.  Continue metformin at current dose.  Will refill today.  Lab Results  Component Value Date   HGBA1C 5.2 03/10/2020   Lab Results  Component Value Date   INSULIN 15.6 03/10/2020   INSULIN 13.5 09/29/2019   INSULIN 14.4 12/04/2018   INSULIN 8.5 03/04/2018   INSULIN 14.2 11/01/2017   - Refill metFORMIN (GLUCOPHAGE) 500 MG tablet; Take 1 tablet (500 mg total) by mouth 3 (three) times daily.  Dispense: 90 tablet; Refill: 0  2. Vitamin D deficiency Not at goal. Current vitamin D is 22.4, tested on 03/10/2020. Optimal goal > 50 ng/dL. She is taking vitamin D 50,000 IU weekly.  Plan: Continue to take prescription Vitamin D @50 ,000 IU every week as prescribed.  Follow-up for routine testing of Vitamin D, at least 2-3 times per year to avoid over-replacement.  - Refill Cholecalciferol (VITAMIN D3) 1.25 MG (50000 UT) CAPS; Take 1 Dose  by mouth once a week.  Dispense: 4 capsule; Refill: 0  3. Other depression, with emotional eating  Not at goal. Medication: Wellbutrin 200 mg twice daily.  Plan:  Continue Wellbutrin.  Will refill today, as per below.  Behavior modification techniques were discussed today to help deal with emotional/non-hunger eating behaviors.  - Refill buPROPion (WELLBUTRIN SR) 200 MG 12 hr tablet; Take 1 tablet (200 mg total) by mouth 2 (two) times daily.  Dispense: 60 tablet; Refill: 0  4. At risk for impaired metabolic function Due to Heather Boone's current state of health and medical condition(s), she is at a significantly higher risk for impaired metabolic function.  This places the patient at a much greater risk to  subsequently develop cardiopulmonary conditions that can negatively affect the patient's quality of life.  At least 9 minutes was spent on counseling Heather Boone about these concerns today, and I stressed the importance of reversing these risks factors.  The initial goal is to lose at least 5-10% of starting weight to help reduce risk factors.  Counseling:  Intensive lifestyle modifications discussed with Heather Boone as the most appropriate first line treatment.  she will continue to work on diet, exercise, and weight loss efforts.  We will continue to reassess these conditions on a fairly regular basis in an attempt to decrease the patient's overall morbidity and mortality.  5. Class 3 severe obesity with serious comorbidity and body mass index (BMI) of 45.0 to 49.9 in adult, unspecified obesity type ([place])  Course: Heather Boone is currently in the action stage of change. As such, her goal is to continue with weight loss efforts.   Nutrition goals: She has agreed to following a lower carbohydrate, vegetable and lean protein rich diet plan.   Exercise goals: As is.  Behavioral modification strategies: increasing lean protein intake, decreasing simple carbohydrates, increasing water intake, no skipping meals and keeping healthy foods in the home.  Heather Boone has agreed to follow-up with our clinic in 2 weeks with Heather Boone, Heather Boone. She was informed of the importance of frequent follow-up visits to maximize her success with intensive lifestyle modifications for her multiple health conditions.   Objective:   Blood pressure 110/71, pulse 83, temperature 98 F (36.7 C), height 5\' 3"  (1.6 m), weight 263 lb (119.3 kg), SpO2 98 %. Body mass index is 46.59 kg/m.  General: Cooperative, alert, well developed, in no acute distress. HEENT: Conjunctivae and lids unremarkable. Cardiovascular: Regular rhythm.  Lungs: Normal work of breathing. Neurologic: No focal deficits.   Lab Results  Component Value Date    CREATININE 0.69 03/10/2020   BUN 12 03/10/2020   NA 140 03/10/2020   K 4.3 03/10/2020   CL 105 03/10/2020   CO2 22 03/10/2020   Lab Results  Component Value Date   ALT 7 03/10/2020   AST 14 03/10/2020   ALKPHOS 81 03/10/2020   BILITOT 0.3 03/10/2020   Lab Results  Component Value Date   HGBA1C 5.2 03/10/2020   HGBA1C 5.2 09/29/2019   HGBA1C 5.3 12/04/2018   HGBA1C 5.2 03/04/2018   HGBA1C 5.1 11/01/2017   Lab Results  Component Value Date   INSULIN 15.6 03/10/2020   INSULIN 13.5 09/29/2019   INSULIN 14.4 12/04/2018   INSULIN 8.5 03/04/2018   INSULIN 14.2 11/01/2017   Lab Results  Component Value Date   TSH 2.570 09/29/2019   Lab Results  Component Value Date   CHOL 146 09/29/2019   HDL 65 09/29/2019   LDLCALC 73 09/29/2019   TRIG  32 09/29/2019   Lab Results  Component Value Date   WBC 6.8 03/04/2018   HGB 11.7 03/04/2018   HCT 37.1 03/04/2018   MCV 84 03/04/2018   PLT 383 01/13/2010   Attestation Statements:   Reviewed by clinician on day of visit: allergies, medications, problem list, medical history, surgical history, family history, social history, and previous encounter notes.  I, Water quality scientist, CMA, am acting as Location manager for [employer], DO.  I have reviewed the above documentation for accuracy and completeness, and I agree with the above. Marjory Sneddon, D.O.  The [place] was signed into law in 2016 which includes the topic of electronic health records.  This provides immediate access to information in MyChart.  This includes consultation notes, operative notes, office notes, lab results and pathology reports.  If you have any questions about what you read please let us know at your next visit so we can discuss your concerns and take corrective action if need be.  We are right here with you.

## 2021-02-28 NOTE — Telephone Encounter (Signed)
fyi

## 2021-03-01 DIAGNOSIS — F4322 Adjustment disorder with anxiety: Secondary | ICD-10-CM | POA: Diagnosis not present

## 2021-03-03 ENCOUNTER — Telehealth (INDEPENDENT_AMBULATORY_CARE_PROVIDER_SITE_OTHER): Payer: 59 | Admitting: Family Medicine

## 2021-03-03 ENCOUNTER — Encounter (INDEPENDENT_AMBULATORY_CARE_PROVIDER_SITE_OTHER): Payer: Self-pay | Admitting: Family Medicine

## 2021-03-03 ENCOUNTER — Other Ambulatory Visit: Payer: Self-pay

## 2021-03-03 DIAGNOSIS — E88819 Insulin resistance, unspecified: Secondary | ICD-10-CM

## 2021-03-03 DIAGNOSIS — Z6841 Body Mass Index (BMI) 40.0 and over, adult: Secondary | ICD-10-CM | POA: Diagnosis not present

## 2021-03-03 DIAGNOSIS — R632 Polyphagia: Secondary | ICD-10-CM | POA: Diagnosis not present

## 2021-03-03 DIAGNOSIS — E8881 Metabolic syndrome: Secondary | ICD-10-CM | POA: Diagnosis not present

## 2021-03-03 MED ORDER — WEGOVY 0.25 MG/0.5ML ~~LOC~~ SOAJ: 0.2500 mg | SUBCUTANEOUS | 0 refills | Status: DC

## 2021-03-08 ENCOUNTER — Encounter (INDEPENDENT_AMBULATORY_CARE_PROVIDER_SITE_OTHER): Payer: Self-pay | Admitting: Family Medicine

## 2021-03-08 DIAGNOSIS — R7303 Prediabetes: Secondary | ICD-10-CM

## 2021-03-08 DIAGNOSIS — R632 Polyphagia: Secondary | ICD-10-CM | POA: Insufficient documentation

## 2021-03-08 MED ORDER — OZEMPIC (0.25 OR 0.5 MG/DOSE) 2 MG/1.5ML ~~LOC~~ SOPN
0.5000 mg | PEN_INJECTOR | SUBCUTANEOUS | 0 refills | Status: DC
Start: 2021-03-08 — End: 2021-04-11

## 2021-03-08 NOTE — Telephone Encounter (Signed)
Please advise 

## 2021-03-08 NOTE — Progress Notes (Addendum)
TeleHealth Visit:  Due to the COVID-19 pandemic, this visit was completed with telemedicine (audio/video) technology to reduce patient and provider exposure as well as to preserve personal protective equipment.   Steele has verbally consented to this TeleHealth visit. The patient is located at home, the provider is located at the [facility] office. The participants in this visit include the listed provider and patient. The visit was conducted today via telephone (20 minute call).  Amyla was unable to use realtime audiovisual technology today and the telehealth visit was conducted via telephone.  Chief Complaint: OBESITY Danielly is here to discuss her progress with her obesity treatment plan along with follow-up of her obesity related diagnoses. Desiraye is on following a lower carbohydrate, vegetable and lean protein rich diet plan and states she is following her eating plan approximately 65% of the time. Karishma states she is walking for 30-45 minutes 3 times per week.  Today's visit was #: 77 Starting weight: 231 lbs Starting date: 07/13/2017  Interim History: Dannon has changed fertility doctors and this doctor is willing to go forward with IVF despite BMI > 40.   She has been trying to eat less carbs but she is snacking on fruit.  We have tried to get anti-obesity medications covered for her but have been unsuccessful.  She now has new insurances ([place]).    Subjective:   1. Polyphagia On metformin three times daily.  Notes polyphagia.  2. Insulin resistance Last fasting insulin was elevated at 15.6.  Lab Results  Component Value Date   INSULIN 15.6 03/10/2020   INSULIN 13.5 09/29/2019   INSULIN 14.4 12/04/2018   INSULIN 8.5 03/04/2018   INSULIN 14.2 11/01/2017   Lab Results  Component Value Date   HGBA1C 5.2 03/10/2020   Assessment/Plan:   1. Polyphagia New prescription for Wegovy 0.25 mg subcutaneously weekly sent to pharmacy  today.  - Start Semaglutide-Weight Management (WEGOVY) 0.25 MG/0.5ML SOAJ; Inject 0.25 mg into the skin once a week.  Dispense: 2 mL; Refill: 0  2. Insulin resistance Continue metformin.  3. Class 3 severe obesity with serious comorbidity and body mass index (BMI) of 45.0 to 49.9 in adult, unspecified obesity type [facility])  New prescription for Wegovy 0.25 mg weekly sent to pharmacy today, as per below.  - Start Semaglutide-Weight Management (WEGOVY) 0.25 MG/0.5ML SOAJ; Inject 0.25 mg into the skin once a week.  Dispense: 2 mL; Refill: 0  Shatarra is currently in the action stage of change. As such, her goal is to continue with weight loss efforts. She has agreed to the Category 2 Plan.   Exercise goals: As is.  Behavioral modification strategies: increasing lean protein intake and decreasing simple carbohydrates.  Alicha has agreed to follow-up with our clinic in 2 weeks.   Objective:   VITALS: Per patient if applicable, see vitals. GENERAL: Alert and in no acute distress. CARDIOPULMONARY: No increased WOB. Speaking in clear sentences.  PSYCH: Pleasant and cooperative. Speech normal rate and rhythm. Affect is appropriate. Insight and judgement are appropriate. Attention is focused, linear, and appropriate.  NEURO: Oriented as arrived to appointment on time with no prompting.   Lab Results  Component Value Date   CREATININE 0.69 03/10/2020   BUN 12 03/10/2020   NA 140 03/10/2020   K 4.3 03/10/2020   CL 105 03/10/2020   CO2 22 03/10/2020   Lab Results  Component Value Date   ALT 7 03/10/2020   AST 14 03/10/2020  ALKPHOS 81 03/10/2020   BILITOT 0.3 03/10/2020   Lab Results  Component Value Date   HGBA1C 5.2 03/10/2020   HGBA1C 5.2 09/29/2019   HGBA1C 5.3 12/04/2018   HGBA1C 5.2 03/04/2018   HGBA1C 5.1 11/01/2017   Lab Results  Component Value Date   INSULIN 15.6 03/10/2020   INSULIN 13.5 09/29/2019   INSULIN 14.4 12/04/2018   INSULIN 8.5 03/04/2018   INSULIN  14.2 11/01/2017   Lab Results  Component Value Date   TSH 2.570 09/29/2019   Lab Results  Component Value Date   CHOL 146 09/29/2019   HDL 65 09/29/2019   LDLCALC 73 09/29/2019   TRIG 32 09/29/2019   Lab Results  Component Value Date   WBC 6.8 03/04/2018   HGB 11.7 03/04/2018   HCT 37.1 03/04/2018   MCV 84 03/04/2018   PLT 383 01/13/2010   Attestation Statements:   Reviewed by clinician on day of visit: allergies, medications, problem list, medical history, surgical history, family history, social history, and previous encounter notes.  I, Water quality scientist, CMA, am acting as Location manager for [employer], [place].  I have reviewed the above documentation for accuracy and completeness, and I agree with the above. - Georgianne Fick, FNP

## 2021-03-09 DIAGNOSIS — F4322 Adjustment disorder with anxiety: Secondary | ICD-10-CM | POA: Diagnosis not present

## 2021-03-14 DIAGNOSIS — Z3141 Encounter for fertility testing: Secondary | ICD-10-CM | POA: Diagnosis not present

## 2021-03-14 DIAGNOSIS — Z31430 Encounter of female for testing for genetic disease carrier status for procreative management: Secondary | ICD-10-CM | POA: Diagnosis not present

## 2021-03-16 ENCOUNTER — Encounter (INDEPENDENT_AMBULATORY_CARE_PROVIDER_SITE_OTHER): Payer: Self-pay | Admitting: Family Medicine

## 2021-03-16 ENCOUNTER — Encounter (INDEPENDENT_AMBULATORY_CARE_PROVIDER_SITE_OTHER): Payer: Self-pay

## 2021-03-17 ENCOUNTER — Ambulatory Visit (INDEPENDENT_AMBULATORY_CARE_PROVIDER_SITE_OTHER): Payer: 59 | Admitting: Family Medicine

## 2021-03-17 ENCOUNTER — Telehealth (INDEPENDENT_AMBULATORY_CARE_PROVIDER_SITE_OTHER): Payer: Self-pay | Admitting: Family Medicine

## 2021-03-17 ENCOUNTER — Other Ambulatory Visit: Payer: Self-pay

## 2021-03-17 ENCOUNTER — Telehealth (INDEPENDENT_AMBULATORY_CARE_PROVIDER_SITE_OTHER): Payer: 59 | Admitting: Family Medicine

## 2021-03-17 ENCOUNTER — Encounter (INDEPENDENT_AMBULATORY_CARE_PROVIDER_SITE_OTHER): Payer: Self-pay | Admitting: Family Medicine

## 2021-03-29 ENCOUNTER — Other Ambulatory Visit (INDEPENDENT_AMBULATORY_CARE_PROVIDER_SITE_OTHER): Payer: Self-pay | Admitting: Family Medicine

## 2021-03-29 DIAGNOSIS — E559 Vitamin D deficiency, unspecified: Secondary | ICD-10-CM

## 2021-03-29 NOTE — Telephone Encounter (Signed)
Pt last seen by Dawn Whitmire, FNP.  

## 2021-03-31 ENCOUNTER — Other Ambulatory Visit (INDEPENDENT_AMBULATORY_CARE_PROVIDER_SITE_OTHER): Payer: Self-pay | Admitting: Adult Health

## 2021-03-31 ENCOUNTER — Other Ambulatory Visit (INDEPENDENT_AMBULATORY_CARE_PROVIDER_SITE_OTHER): Payer: Self-pay | Admitting: Family Medicine

## 2021-03-31 DIAGNOSIS — E8881 Metabolic syndrome: Secondary | ICD-10-CM

## 2021-03-31 MED ORDER — METFORMIN HCL 500 MG PO TABS: 500.0000 mg | ORAL_TABLET | Freq: Three times a day (TID) | ORAL | 0 refills | Status: DC

## 2021-03-31 NOTE — Telephone Encounter (Signed)
Refill request

## 2021-04-04 ENCOUNTER — Ambulatory Visit (INDEPENDENT_AMBULATORY_CARE_PROVIDER_SITE_OTHER): Payer: 59 | Admitting: Family Medicine

## 2021-04-11 ENCOUNTER — Other Ambulatory Visit: Payer: Self-pay

## 2021-04-11 ENCOUNTER — Encounter (INDEPENDENT_AMBULATORY_CARE_PROVIDER_SITE_OTHER): Payer: Self-pay | Admitting: Family Medicine

## 2021-04-11 ENCOUNTER — Ambulatory Visit (INDEPENDENT_AMBULATORY_CARE_PROVIDER_SITE_OTHER): Payer: 59 | Admitting: Family Medicine

## 2021-04-11 VITALS — BP 133/75 | HR 68 | Temp 98.2°F | Ht 63.0 in | Wt 262.0 lb

## 2021-04-11 DIAGNOSIS — Z9189 Other specified personal risk factors, not elsewhere classified: Secondary | ICD-10-CM | POA: Diagnosis not present

## 2021-04-11 DIAGNOSIS — R7303 Prediabetes: Secondary | ICD-10-CM

## 2021-04-11 DIAGNOSIS — E8881 Metabolic syndrome: Secondary | ICD-10-CM

## 2021-04-11 DIAGNOSIS — E559 Vitamin D deficiency, unspecified: Secondary | ICD-10-CM

## 2021-04-11 DIAGNOSIS — Z6841 Body Mass Index (BMI) 40.0 and over, adult: Secondary | ICD-10-CM | POA: Diagnosis not present

## 2021-04-11 MED ORDER — OZEMPIC (0.25 OR 0.5 MG/DOSE) 2 MG/1.5ML ~~LOC~~ SOPN: 0.5000 mg | PEN_INJECTOR | SUBCUTANEOUS | 0 refills | Status: DC

## 2021-04-11 MED ORDER — VITAMIN D3 1.25 MG (50000 UT) PO CAPS
1.0000 | ORAL_CAPSULE | ORAL | 0 refills | Status: DC
Start: 2021-04-11 — End: 2021-05-03

## 2021-04-12 NOTE — Progress Notes (Signed)
Chief Complaint:   OBESITY Heather Boone is here to discuss her progress with her obesity treatment plan along with follow-up of her obesity related diagnoses. Heather Boone is on the Category 2 Plan and states she is following her eating plan approximately 65% of the time. Heather Boone states she is walking 30-60 minutes 5 times per week.  Today's visit was #: 49 Starting weight: 231 lbs Starting date: 07/13/2017 Today's weight: 262 lbs Today's date: 04/11/2021 Total lbs lost to date: 0 Total lbs lost since last in-office visit: 1  Interim History: Heather Boone has been trying to follow category 2. She tends to snack too much. She will be starting IVF in 3 months and wants to lose as much weight as possible before then to increase her chance of success.  Subjective:   1. Insulin resistance Heather Boone is on Ozempic 0.25 mg. She has not noticed any appetite suppression or side effects.  2. Vitamin D deficiency Heather Boone's Vit D level is very low at 17. She is on weekly prescription Vit D.  3. At risk for side effect of medication Heather Boone is at risk for side effect of medication due to increasing dose of Ozempic.   Assessment/Plan:   1. Insulin resistance  Increase dose of Ozempic to 0.5 mg weekly, as prescribed below.  - Semaglutide,0.25 or 0.5MG /DOS, (OZEMPIC, 0.25 OR 0.5 MG/DOSE,) 2 MG/1.5ML SOPN; Inject 0.5 mg into the skin once a week.  Dispense: 1.5 mL; Refill: 0  2. Vitamin D deficiency  She agrees to continue to take prescription Vitamin D @50 ,000 IU every 3 days and will follow-up for routine testing of Vitamin D, at least 2-3 times per year to avoid over-replacement.  - Cholecalciferol (VITAMIN D3) 1.25 MG (50000 UT) CAPS; Take 1 Dose by mouth every 3 (three) days.  Dispense: 30 capsule; Refill: 0  3. At risk for side effect of medication Heather Boone was given approximately 15 minutes of drug side effect counseling today.  We discussed side effect possibility and risk versus benefits.  Heather Boone agreed to the medication and will contact this office if these side effects are intolerable.  Repetitive spaced learning was employed today to elicit superior memory formation and behavioral change.  4. Obesity: Current BMI 46 Heather Boone is currently in the action stage of change. As such, her goal is to continue with weight loss efforts. She has agreed to following a lower carbohydrate, vegetable and lean protein rich diet plan.   Exercise goals: As is  Behavioral modification strategies: increasing lean protein intake and decreasing simple carbohydrates.  Heather Boone has agreed to follow-up with our clinic in 3-4 weeks.  Objective:   Blood pressure 133/75, pulse 68, temperature 98.2 F (36.8 C), height 5\' 3"  (1.6 m), weight 262 lb (118.8 kg), SpO2 99 %. Body mass index is 46.41 kg/m.  General: Cooperative, alert, well developed, in no acute distress. HEENT: Conjunctivae and lids unremarkable. Cardiovascular: Regular rhythm.  Lungs: Normal work of breathing. Neurologic: No focal deficits.   Lab Results  Component Value Date   CREATININE 0.69 03/10/2020   BUN 12 03/10/2020   NA 140 03/10/2020   K 4.3 03/10/2020   CL 105 03/10/2020   CO2 22 03/10/2020   Lab Results  Component Value Date   ALT 7 03/10/2020   AST 14 03/10/2020   ALKPHOS 81 03/10/2020   BILITOT 0.3 03/10/2020   Lab Results  Component Value Date   HGBA1C 5.2 03/10/2020   HGBA1C 5.2 09/29/2019   HGBA1C 5.3 12/04/2018  HGBA1C 5.2 03/04/2018   HGBA1C 5.1 11/01/2017   Lab Results  Component Value Date   INSULIN 15.6 03/10/2020   INSULIN 13.5 09/29/2019   INSULIN 14.4 12/04/2018   INSULIN 8.5 03/04/2018   INSULIN 14.2 11/01/2017   Lab Results  Component Value Date   TSH 2.570 09/29/2019   Lab Results  Component Value Date   CHOL 146 09/29/2019   HDL 65 09/29/2019   LDLCALC 73 09/29/2019   TRIG 32 09/29/2019   Lab Results  Component Value Date   WBC 6.8 03/04/2018   HGB 11.7 03/04/2018    HCT 37.1 03/04/2018   MCV 84 03/04/2018   PLT 383 01/13/2010    Attestation Statements:   Reviewed by clinician on day of visit: allergies, medications, problem list, medical history, surgical history, family history, social history, and previous encounter notes.  Coral Ceo, am acting as Location manager for [employer], [place].  I have reviewed the above documentation for accuracy and completeness, and I agree with the above. -  Georgianne Fick, FNP

## 2021-04-13 ENCOUNTER — Encounter (INDEPENDENT_AMBULATORY_CARE_PROVIDER_SITE_OTHER): Payer: Self-pay | Admitting: Family Medicine

## 2021-04-20 DIAGNOSIS — F4322 Adjustment disorder with anxiety: Secondary | ICD-10-CM | POA: Diagnosis not present

## 2021-04-28 DIAGNOSIS — F4322 Adjustment disorder with anxiety: Secondary | ICD-10-CM | POA: Diagnosis not present

## 2021-05-03 ENCOUNTER — Telehealth (INDEPENDENT_AMBULATORY_CARE_PROVIDER_SITE_OTHER): Payer: 59 | Admitting: Family Medicine

## 2021-05-03 ENCOUNTER — Encounter (INDEPENDENT_AMBULATORY_CARE_PROVIDER_SITE_OTHER): Payer: Self-pay | Admitting: Family Medicine

## 2021-05-03 ENCOUNTER — Other Ambulatory Visit: Payer: Self-pay

## 2021-05-03 DIAGNOSIS — E88819 Insulin resistance, unspecified: Secondary | ICD-10-CM

## 2021-05-03 DIAGNOSIS — F4322 Adjustment disorder with anxiety: Secondary | ICD-10-CM | POA: Diagnosis not present

## 2021-05-03 DIAGNOSIS — Z6841 Body Mass Index (BMI) 40.0 and over, adult: Secondary | ICD-10-CM | POA: Diagnosis not present

## 2021-05-03 DIAGNOSIS — E8881 Metabolic syndrome: Secondary | ICD-10-CM | POA: Diagnosis not present

## 2021-05-03 DIAGNOSIS — E559 Vitamin D deficiency, unspecified: Secondary | ICD-10-CM | POA: Diagnosis not present

## 2021-05-03 MED ORDER — VITAMIN D3 1.25 MG (50000 UT) PO CAPS: 1.0000 | ORAL_CAPSULE | ORAL | 0 refills | Status: DC

## 2021-05-03 MED ORDER — OZEMPIC (1 MG/DOSE) 4 MG/3ML ~~LOC~~ SOPN
1.0000 mg | PEN_INJECTOR | SUBCUTANEOUS | 0 refills | Status: DC
Start: 2021-05-03 — End: 2022-01-04

## 2021-05-04 DIAGNOSIS — Z0289 Encounter for other administrative examinations: Secondary | ICD-10-CM | POA: Diagnosis not present

## 2021-05-04 NOTE — Progress Notes (Signed)
TeleHealth Visit:  Due to the COVID-19 pandemic, this visit was completed with telemedicine (audio/video) technology to reduce patient and provider exposure as well as to preserve personal protective equipment.   Heather Boone has verbally consented to this TeleHealth visit. The patient is located at home, the provider is located at the [facility] office. The participants in this visit include the listed provider and patient. The visit was conducted today via video.  Chief Complaint: OBESITY Heather Boone is here to discuss her progress with her obesity treatment plan along with follow-up of her obesity related diagnoses. Heather Boone is on following a lower carbohydrate, vegetable and lean protein rich diet plan and states she is following her eating plan approximately 75% of the time. Heather Boone states she is walking 45 minutes 4 times per week.  Today's visit was #: 5 Starting weight: 231 lbs Starting date: 07/13/2017  Interim History: Heather Boone thinks she has lost 3 lbs. She is eating more vegetables. She is getting in plenty of protein. She will be having IVF or IUI (intra-uterine insemination) in August 2022.  Subjective:   1. Insulin resistance Heather Boone is tolerating Ozempic well. She denies constipation or nausea on Ozempic 0.5 mg weekly.  Lab Results  Component Value Date   INSULIN 15.6 03/10/2020   INSULIN 13.5 09/29/2019   INSULIN 14.4 12/04/2018   INSULIN 8.5 03/04/2018   INSULIN 14.2 11/01/2017   Lab Results  Component Value Date   HGBA1C 5.2 03/10/2020    2. Vitamin D deficiency Vit D very low at 85. Heather Boone is on weekly prescription Vit D.  Assessment/Plan:   1. Insulin resistance  Increase dose of Ozempic to 1 mg weekly, as prescribed below. Pt advised to d/c 2 months before pregnancy attempt.  - Semaglutide, 1 MG/DOSE, (OZEMPIC, 1 MG/DOSE,) 4 MG/3ML SOPN; Inject 1 mg into the skin once a week.  Dispense: 3 mL; Refill: 0  2. Vitamin D deficiency . She  agrees to continue to take prescription Vitamin D @50 ,000 IU every 3 days and will follow-up for routine testing of Vitamin D, at least 2-3 times per year to avoid over-replacement.  - Cholecalciferol (VITAMIN D3) 1.25 MG (50000 UT) CAPS; Take 1 Dose by mouth every 3 (three) days.  Dispense: 30 capsule; Refill: 0  3. Obesity: Current BMI 46.42  Heather Boone is currently in the action stage of change. As such, her goal is to continue with weight loss efforts. She has agreed to following a lower carbohydrate, vegetable and lean protein rich diet plan.   May have one fruit serving per day and 1 yogurt per day.  Exercise goals: As is  Behavioral modification strategies: decreasing simple carbohydrates and planning for success.  Heather Boone has agreed to follow-up with our clinic in 3 weeks.  Objective:   VITALS: Per patient if applicable, see vitals. GENERAL: Alert and in no acute distress. CARDIOPULMONARY: No increased WOB. Speaking in clear sentences.  PSYCH: Pleasant and cooperative. Speech normal rate and rhythm. Affect is appropriate. Insight and judgement are appropriate. Attention is focused, linear, and appropriate.  NEURO: Oriented as arrived to appointment on time with no prompting.   Lab Results  Component Value Date   CREATININE 0.69 03/10/2020   BUN 12 03/10/2020   NA 140 03/10/2020   K 4.3 03/10/2020   CL 105 03/10/2020   CO2 22 03/10/2020   Lab Results  Component Value Date   ALT 7 03/10/2020   AST 14 03/10/2020   ALKPHOS 81 03/10/2020  BILITOT 0.3 03/10/2020   Lab Results  Component Value Date   HGBA1C 5.2 03/10/2020   HGBA1C 5.2 09/29/2019   HGBA1C 5.3 12/04/2018   HGBA1C 5.2 03/04/2018   HGBA1C 5.1 11/01/2017   Lab Results  Component Value Date   INSULIN 15.6 03/10/2020   INSULIN 13.5 09/29/2019   INSULIN 14.4 12/04/2018   INSULIN 8.5 03/04/2018   INSULIN 14.2 11/01/2017   Lab Results  Component Value Date   TSH 2.570 09/29/2019   Lab Results   Component Value Date   CHOL 146 09/29/2019   HDL 65 09/29/2019   LDLCALC 73 09/29/2019   TRIG 32 09/29/2019   Lab Results  Component Value Date   WBC 6.8 03/04/2018   HGB 11.7 03/04/2018   HCT 37.1 03/04/2018   MCV 84 03/04/2018   PLT 383 01/13/2010   No results found for: IRON, TIBC, FERRITIN  Attestation Statements:   Reviewed by clinician on day of visit: allergies, medications, problem list, medical history, surgical history, family history, social history, and previous encounter notes.  Coral Ceo, CMA, am acting as Location manager for [employer], [place].  I have reviewed the above documentation for accuracy and completeness, and I agree with the above. - Georgianne Fick, FNP

## 2021-05-05 ENCOUNTER — Encounter (INDEPENDENT_AMBULATORY_CARE_PROVIDER_SITE_OTHER): Payer: Self-pay | Admitting: Family Medicine

## 2021-05-09 DIAGNOSIS — F4322 Adjustment disorder with anxiety: Secondary | ICD-10-CM | POA: Diagnosis not present

## 2021-05-16 DIAGNOSIS — F4322 Adjustment disorder with anxiety: Secondary | ICD-10-CM | POA: Diagnosis not present

## 2021-05-25 ENCOUNTER — Ambulatory Visit (INDEPENDENT_AMBULATORY_CARE_PROVIDER_SITE_OTHER): Payer: 59 | Admitting: Family Medicine

## 2021-05-25 DIAGNOSIS — F4322 Adjustment disorder with anxiety: Secondary | ICD-10-CM | POA: Diagnosis not present

## 2021-06-14 ENCOUNTER — Encounter (INDEPENDENT_AMBULATORY_CARE_PROVIDER_SITE_OTHER): Payer: Self-pay | Admitting: Family Medicine

## 2021-06-15 ENCOUNTER — Ambulatory Visit (INDEPENDENT_AMBULATORY_CARE_PROVIDER_SITE_OTHER): Payer: 59 | Admitting: Family Medicine

## 2021-06-17 DIAGNOSIS — F4322 Adjustment disorder with anxiety: Secondary | ICD-10-CM | POA: Diagnosis not present

## 2021-06-24 DIAGNOSIS — F4322 Adjustment disorder with anxiety: Secondary | ICD-10-CM | POA: Diagnosis not present

## 2021-07-06 DIAGNOSIS — Z111 Encounter for screening for respiratory tuberculosis: Secondary | ICD-10-CM | POA: Diagnosis not present

## 2021-08-09 ENCOUNTER — Ambulatory Visit: Payer: 59 | Admitting: Obstetrics & Gynecology

## 2021-08-09 DIAGNOSIS — Z0289 Encounter for other administrative examinations: Secondary | ICD-10-CM

## 2021-10-10 NOTE — Telephone Encounter (Signed)
Note not needed 

## 2021-11-14 ENCOUNTER — Ambulatory Visit (INDEPENDENT_AMBULATORY_CARE_PROVIDER_SITE_OTHER): Payer: 59 | Admitting: Family Medicine

## 2021-11-21 DIAGNOSIS — Z0289 Encounter for other administrative examinations: Secondary | ICD-10-CM

## 2021-11-30 ENCOUNTER — Ambulatory Visit (INDEPENDENT_AMBULATORY_CARE_PROVIDER_SITE_OTHER): Payer: 59 | Admitting: Family Medicine

## 2022-01-03 ENCOUNTER — Encounter (INDEPENDENT_AMBULATORY_CARE_PROVIDER_SITE_OTHER): Payer: Self-pay | Admitting: Family Medicine

## 2022-01-03 ENCOUNTER — Ambulatory Visit (INDEPENDENT_AMBULATORY_CARE_PROVIDER_SITE_OTHER): Payer: No Typology Code available for payment source | Admitting: Family Medicine

## 2022-01-03 ENCOUNTER — Other Ambulatory Visit: Payer: Self-pay

## 2022-01-03 VITALS — BP 117/61 | HR 69 | Temp 98.0°F | Ht 63.0 in | Wt 257.0 lb

## 2022-01-03 DIAGNOSIS — E559 Vitamin D deficiency, unspecified: Secondary | ICD-10-CM

## 2022-01-03 DIAGNOSIS — Z1331 Encounter for screening for depression: Secondary | ICD-10-CM

## 2022-01-03 DIAGNOSIS — R5383 Other fatigue: Secondary | ICD-10-CM | POA: Diagnosis not present

## 2022-01-03 DIAGNOSIS — Z6841 Body Mass Index (BMI) 40.0 and over, adult: Secondary | ICD-10-CM

## 2022-01-03 DIAGNOSIS — F3289 Other specified depressive episodes: Secondary | ICD-10-CM | POA: Diagnosis not present

## 2022-01-03 DIAGNOSIS — E8881 Metabolic syndrome: Secondary | ICD-10-CM | POA: Diagnosis not present

## 2022-01-03 DIAGNOSIS — Z9189 Other specified personal risk factors, not elsewhere classified: Secondary | ICD-10-CM | POA: Diagnosis not present

## 2022-01-03 DIAGNOSIS — R0602 Shortness of breath: Secondary | ICD-10-CM | POA: Diagnosis not present

## 2022-01-03 MED ORDER — BUPROPION HCL ER (SR) 200 MG PO TB12: 200.0000 mg | ORAL_TABLET | Freq: Two times a day (BID) | ORAL | 0 refills | Status: DC

## 2022-01-04 LAB — COMPREHENSIVE METABOLIC PANEL
ALT: 13 IU/L (ref 0–32)
AST: 13 IU/L (ref 0–40)
Albumin/Globulin Ratio: 1.4 (ref 1.2–2.2)
Albumin: 4.1 g/dL (ref 3.8–4.8)
Alkaline Phosphatase: 99 IU/L (ref 44–121)
BUN/Creatinine Ratio: 13 (ref 9–23)
BUN: 8 mg/dL (ref 6–20)
Bilirubin Total: 0.4 mg/dL (ref 0.0–1.2)
CO2: 20 mmol/L (ref 20–29)
Calcium: 9.2 mg/dL (ref 8.7–10.2)
Chloride: 103 mmol/L (ref 96–106)
Creatinine, Ser: 0.62 mg/dL (ref 0.57–1.00)
Globulin, Total: 2.9 g/dL (ref 1.5–4.5)
Glucose: 84 mg/dL (ref 70–99)
Potassium: 4.7 mmol/L (ref 3.5–5.2)
Sodium: 139 mmol/L (ref 134–144)
Total Protein: 7 g/dL (ref 6.0–8.5)
eGFR: 117 mL/min/{1.73_m2} (ref >59)

## 2022-01-04 LAB — T3, FREE: T3, Free: 2.7 pg/mL (ref 2.0–4.4)

## 2022-01-04 LAB — INSULIN, RANDOM: INSULIN: 12 u[IU]/mL (ref 2.6–24.9)

## 2022-01-04 LAB — CBC WITH DIFFERENTIAL/PLATELET
Basophils Absolute: 0.1 10*3/uL (ref 0.0–0.2)
Basos: 1 %
EOS (ABSOLUTE): 0.1 10*3/uL (ref 0.0–0.4)
Eos: 1 %
Hematocrit: 40.5 % (ref 34.0–46.6)
Hemoglobin: 13.1 g/dL (ref 11.1–15.9)
Immature Grans (Abs): 0.1 10*3/uL (ref 0.0–0.1)
Immature Granulocytes: 1 %
Lymphocytes Absolute: 1.8 10*3/uL (ref 0.7–3.1)
Lymphs: 24 %
MCH: 26.4 pg — ABNORMAL LOW (ref 26.6–33.0)
MCHC: 32.3 g/dL (ref 31.5–35.7)
MCV: 82 fL (ref 79–97)
Monocytes Absolute: 0.5 10*3/uL (ref 0.1–0.9)
Monocytes: 7 %
Neutrophils Absolute: 5.1 10*3/uL (ref 1.4–7.0)
Neutrophils: 66 %
Platelets: 373 10*3/uL (ref 150–450)
RBC: 4.97 x10E6/uL (ref 3.77–5.28)
RDW: 13.3 % (ref 11.7–15.4)
WBC: 7.5 10*3/uL (ref 3.4–10.8)

## 2022-01-04 LAB — LIPID PANEL
Chol/HDL Ratio: 2.5 ratio (ref 0.0–4.4)
Cholesterol, Total: 161 mg/dL (ref 100–199)
HDL: 64 mg/dL (ref >39)
LDL Chol Calc (NIH): 82 mg/dL (ref 0–99)
Triglycerides: 82 mg/dL (ref 0–149)
VLDL Cholesterol Cal: 15 mg/dL (ref 5–40)

## 2022-01-04 LAB — VITAMIN D 25 HYDROXY (VIT D DEFICIENCY, FRACTURES): Vit D, 25-Hydroxy: 27.5 ng/mL — ABNORMAL LOW (ref 30.0–100.0)

## 2022-01-04 LAB — HEMOGLOBIN A1C
Est. average glucose Bld gHb Est-mCnc: 117 mg/dL
Hgb A1c MFr Bld: 5.7 % — ABNORMAL HIGH (ref 4.8–5.6)

## 2022-01-04 LAB — VITAMIN B12: Vitamin B-12: 434 pg/mL (ref 232–1245)

## 2022-01-04 LAB — TSH: TSH: 1.18 u[IU]/mL (ref 0.450–4.500)

## 2022-01-04 LAB — T4, FREE: Free T4: 0.98 ng/dL (ref 0.82–1.77)

## 2022-01-04 LAB — FOLATE: Folate: 11.2 ng/mL (ref >3.0)

## 2022-01-04 MED ORDER — OZEMPIC (1 MG/DOSE) 4 MG/3ML ~~LOC~~ SOPN: 1.0000 mg | PEN_INJECTOR | SUBCUTANEOUS | 0 refills | Status: DC

## 2022-01-04 NOTE — Progress Notes (Signed)
Chief Complaint:   Heather Boone (MR# [id number]) is a 41 y.o. female who presents for evaluation and treatment of obesity and related comorbidities. Current BMI is Body mass index is 45.53 kg/m. Heather Boone has been struggling with her weight for many years and has been unsuccessful in either losing weight, maintaining weight loss, or reaching her healthy weight goal.  Heather Boone is ready to start working on weight loss after being away from the program for 6+ months. She is ready to start on a structured plan and restart her medications.  Heather Boone is currently in the action stage of change and ready to dedicate time achieving and maintaining a healthier weight. Heather Boone is interested in becoming our patient and working on intensive lifestyle modifications including (but not limited to) diet and exercise for weight loss.  Heather Boone's habits were reviewed today and are as follows: Her family eats meals together, she thinks her family will eat healthier with her, her desired weight loss is 77 lbs, she has been heavy most of her life, she started gaining weight 20 years, her heaviest weight ever was 324 pounds, she has significant food cravings issues, she snacks frequently in the evenings, she wakes up frequently in the middle of the night to eat, she skips meals frequently, she is frequently drinking liquids with calories, she frequently makes poor food choices, she has problems with excessive hunger, she frequently eats larger portions than normal, and she struggles with emotional eating.  Depression Screen Heather Boone's Food and Mood (modified PHQ-9) score was 11.  Depression screen PHQ 2/9 01/03/2022  Decreased Interest 2  Down, Depressed, Hopeless 2  PHQ - 2 Score 4  Altered sleeping 2  Tired, decreased energy 2  Change in appetite 2  Feeling bad or failure about yourself  1  Trouble concentrating 0  Moving slowly or fidgety/restless 0  Suicidal thoughts 0  PHQ-9 Score 11   Difficult doing work/chores Not difficult at all   Subjective:   1. Other fatigue Heather Boone admits to daytime somnolence and admits to waking up still tired. Patent has a history of symptoms of daytime fatigue and morning fatigue. Heather Boone generally gets 7 hours of sleep per night, and states that she has generally restful sleep. Snoring is not present. Apneic episodes are not present. Epworth Sleepiness Score is 8.  2. SOB (shortness of breath) on exertion Heather Boone notes increasing shortness of breath with exercising and seems to be worsening over time with weight gain. She notes getting out of breath sooner with activity than she used to. This has not gotten worse recently. Heather Boone denies shortness of breath at rest or orthopnea.  3. Insulin resistance Heather Boone has been off of her Ozempic and she notes increased polyphagia. She is ready to get back to diet and restarting her medications.  4. Vitamin D deficiency Heather Boone is not on Vit D currently, and she is due to have labs checked. She notes fatigue.  5. Other depression, with emotional eating  Heather Boone is off Wellbutrin but she feels this is a good time to restart, especially as she is grieving the recent loss of her motor.  6. At risk for diabetes mellitus Heather Boone is at higher than average risk for developing diabetes due to obesity.   Assessment/Plan:   1. Other fatigue Heather Boone does feel that her weight is causing her energy to be lower than it should be. Fatigue may be related to obesity, depression or many other causes. Labs will be ordered, and  in the meanwhile, Heather Boone will focus on self care including making healthy food choices, increasing physical activity and focusing on stress reduction.  - CBC with Differential/Platelet - Comprehensive metabolic panel - Folate - Lipid panel - T3, free - T4, free - TSH - Vitamin B12  2. SOB (shortness of breath) on exertion Heather Boone does feel that she gets out of breath more  easily that she used to when she exercises. Heather Boone's shortness of breath appears to be obesity related and exercise induced. She has agreed to work on weight loss and gradually increase exercise to treat her exercise induced shortness of breath. Will continue to monitor closely.  3. Insulin resistance We will check labs today. Heather Boone agreed to restart Ozempic at 0.25 mg. She will continue to work on weight loss, exercise, and decreasing simple carbohydrates to help decrease the risk of diabetes. Heather Boone agreed to follow-up with [place] as directed to closely monitor her progress.  - Hemoglobin A1c - Insulin, random  4. Vitamin D deficiency Low Vitamin D level contributes to fatigue and are associated with obesity, breast, and colon cancer. We will check labs today, and Heather Boone will restart Vitamin D. She will follow-up for routine testing of Vitamin D, at least 2-3 times per year to avoid over-replacement.  - VITAMIN D 25 Hydroxy (Vit-D Deficiency, Fractures)  5. Other depression, with emotional eating  Behavior modification techniques were discussed today to help Heather Boone deal with her emotional/non-hunger eating behaviors. Heather Boone agreed to restart Wellbutrin SR 200 mg q AM, and we will continue to follow. Orders and follow up as documented in patient record.   - buPROPion (WELLBUTRIN SR) 200 MG 12 hr tablet; Take 1 tablet (200 mg total) by mouth 2 (two) times daily.  Dispense: 60 tablet; Refill: 0  6. At risk for diabetes mellitus Heather Boone was given approximately 30 minutes of diabetes education and counseling today. We discussed intensive lifestyle modifications today with an emphasis on weight loss as well as increasing exercise and decreasing simple carbohydrates in her diet. We also reviewed medication options with an emphasis on risk versus benefit of those discussed.   Repetitive spaced learning was employed today to elicit superior memory formation and behavioral change.  7.  Obesity: Current BMI 45.6 Heather Boone is currently in the action stage of change and her goal is to continue with weight loss efforts. I recommend Heather Boone begin the structured treatment plan as follows:  She has agreed to the Category 2 Plan.  Exercise goals: No exercise has been prescribed for now, while we concentrate on nutritional changes.  Behavioral modification strategies: increasing lean protein intake and no skipping meals.  She was informed of the importance of frequent follow-up visits to maximize her success with intensive lifestyle modifications for her multiple health conditions. She was informed we would discuss her lab results at her next visit unless there is a critical issue that needs to be addressed sooner. Heather Boone agreed to keep her next visit at the agreed upon time to discuss these results.  Objective:   Blood pressure 117/61, pulse 69, temperature 98 F (36.7 C), temperature source Oral, height 5\' 3"  (1.6 m), weight 257 lb (116.6 kg), last menstrual period 12/24/2021, SpO2 100 %. Body mass index is 45.53 kg/m.  EKG: Normal sinus rhythm, rate (unable to obtain).  Indirect Calorimeter completed today shows a VO2 of 299 and a REE of 2059.  Her calculated basal metabolic rate is 1308 thus her basal metabolic rate is better than expected.  General: Cooperative,  alert, well developed, in no acute distress. HEENT: Conjunctivae and lids unremarkable. Cardiovascular: Regular rhythm.  Lungs: Normal work of breathing. Neurologic: No focal deficits.   Lab Results  Component Value Date   CREATININE 0.62 01/03/2022   BUN 8 01/03/2022   NA 139 01/03/2022   K 4.7 01/03/2022   CL 103 01/03/2022   CO2 20 01/03/2022   Lab Results  Component Value Date   ALT 13 01/03/2022   AST 13 01/03/2022   ALKPHOS 99 01/03/2022   BILITOT 0.4 01/03/2022   Lab Results  Component Value Date   HGBA1C 5.7 (H) 01/03/2022   HGBA1C 5.2 03/10/2020   HGBA1C 5.2 09/29/2019   HGBA1C 5.3  12/04/2018   HGBA1C 5.2 03/04/2018   Lab Results  Component Value Date   INSULIN 12.0 01/03/2022   INSULIN 15.6 03/10/2020   INSULIN 13.5 09/29/2019   INSULIN 14.4 12/04/2018   INSULIN 8.5 03/04/2018   Lab Results  Component Value Date   TSH 1.180 01/03/2022   Lab Results  Component Value Date   CHOL 161 01/03/2022   HDL 64 01/03/2022   LDLCALC 82 01/03/2022   TRIG 82 01/03/2022   CHOLHDL 2.5 01/03/2022   Lab Results  Component Value Date   WBC 7.5 01/03/2022   HGB 13.1 01/03/2022   HCT 40.5 01/03/2022   MCV 82 01/03/2022   PLT 373 01/03/2022   No results found for: IRON, TIBC, FERRITIN  Attestation Statements:   Reviewed by clinician on day of visit: allergies, medications, problem list, medical history, surgical history, family history, social history, and previous encounter notes.   I, Trixie Dredge, am acting as transcriptionist for Dennard Nip, MD.  I have reviewed the above documentation for accuracy and completeness, and I agree with the above. - Dennard Nip, MD

## 2022-01-16 ENCOUNTER — Telehealth (INDEPENDENT_AMBULATORY_CARE_PROVIDER_SITE_OTHER): Payer: Self-pay

## 2022-01-16 NOTE — Telephone Encounter (Signed)
Ref #[id number]  Error message for Prior Auth on Ozempic 1mg .  Please call.

## 2022-01-17 ENCOUNTER — Encounter (INDEPENDENT_AMBULATORY_CARE_PROVIDER_SITE_OTHER): Payer: Self-pay | Admitting: Family Medicine

## 2022-01-17 ENCOUNTER — Ambulatory Visit (INDEPENDENT_AMBULATORY_CARE_PROVIDER_SITE_OTHER): Payer: No Typology Code available for payment source | Admitting: Family Medicine

## 2022-01-17 ENCOUNTER — Other Ambulatory Visit: Payer: Self-pay

## 2022-01-17 VITALS — BP 108/73 | HR 66 | Temp 97.9°F | Ht 63.0 in | Wt 258.0 lb

## 2022-01-17 DIAGNOSIS — E669 Obesity, unspecified: Secondary | ICD-10-CM

## 2022-01-17 DIAGNOSIS — R7303 Prediabetes: Secondary | ICD-10-CM | POA: Diagnosis not present

## 2022-01-17 DIAGNOSIS — E559 Vitamin D deficiency, unspecified: Secondary | ICD-10-CM

## 2022-01-17 DIAGNOSIS — Z9189 Other specified personal risk factors, not elsewhere classified: Secondary | ICD-10-CM

## 2022-01-17 DIAGNOSIS — Z6841 Body Mass Index (BMI) 40.0 and over, adult: Secondary | ICD-10-CM

## 2022-01-17 DIAGNOSIS — E66813 Obesity, class 3: Secondary | ICD-10-CM

## 2022-01-17 MED ORDER — OZEMPIC (1 MG/DOSE) 4 MG/3ML ~~LOC~~ SOPN: 1.0000 mg | PEN_INJECTOR | SUBCUTANEOUS | 0 refills | Status: DC

## 2022-01-17 MED ORDER — VITAMIN D (ERGOCALCIFEROL) 1.25 MG (50000 UNIT) PO CAPS: 50000.0000 [IU] | ORAL_CAPSULE | ORAL | 0 refills | Status: DC

## 2022-01-17 NOTE — Progress Notes (Signed)
Chief Complaint:   OBESITY Heather Boone is here to discuss her progress with her obesity treatment plan along with follow-up of her obesity related diagnoses. [name] is on the Category 2 Plan and states she is following her eating plan approximately 80% of the time. Grizelda states she is doing 0 minutes 0 times per week.  Today's visit was #: 2 Starting weight: 257 lbs Starting date: 01/03/2022 Today's weight: 258 lbs Today's date: 01/17/2022 Total lbs lost to date: 0 Total lbs lost since last in-office visit: 0  Interim History: Heather Boone tried to be mindful of her food choices and worked to decrease her calories. She feels she will do better with a journaling plan.  Subjective:   1. Vitamin D deficiency [name] is off of Vit D and her level is below goal. She notes fatigue. I discussed labs with the patient today.  2. Pre-diabetes [name] was prescribed Ozempic, but she was unable to get it. She has new insurance now. I discussed labs with the patient today.  3. At risk for diabetes mellitus Heather Boone is at higher than average risk for developing diabetes due to obesity.   Assessment/Plan:   1. Vitamin D deficiency Low Vitamin D level contributes to fatigue and are associated with obesity, breast, and colon cancer. Nohemy agreed to start prescription Vitamin D 50,000 IU every week #4 with no refills. She will follow-up for routine testing of Vitamin D, at least 2-3 times per year to avoid over-replacement.  2. Pre-diabetes Heather Boone agreed to start Ozempic at 0.25 mg weekly with no refills. She will continue to work on weight loss, exercise, and decreasing simple carbohydrates to help decrease the risk of diabetes.   3. At risk for diabetes mellitus Heather Boone was given approximately 15 minutes of diabetes education and counseling today. We discussed intensive lifestyle modifications today with an emphasis on weight loss as well as increasing exercise and decreasing simple  carbohydrates in her diet. We also reviewed medication options with an emphasis on risk versus benefit of those discussed.   Repetitive spaced learning was employed today to elicit superior memory formation and behavioral change.  4. Obesity: Current BMI 45.7 [name] is currently in the action stage of change. As such, her goal is to continue with weight loss efforts. She has agreed to keeping a food journal and adhering to recommended goals of 1200-1500 calories and 85+ grams of protein daily.   Behavioral modification strategies: increasing lean protein intake, no skipping meals, and keeping a strict food journal.  Heather Boone has agreed to follow-up with our clinic in 2 weeks. She was informed of the importance of frequent follow-up visits to maximize her success with intensive lifestyle modifications for her multiple health conditions.   Objective:   Blood pressure 108/73, pulse 66, temperature 97.9 F (36.6 C), height 5\' 3"  (1.6 m), last menstrual period 12/24/2021, SpO2 98 %. Body mass index is 45.53 kg/m.  General: Cooperative, alert, well developed, in no acute distress. HEENT: Conjunctivae and lids unremarkable. Cardiovascular: Regular rhythm.  Lungs: Normal work of breathing. Neurologic: No focal deficits.   Lab Results  Component Value Date   CREATININE 0.62 01/03/2022   BUN 8 01/03/2022   NA 139 01/03/2022   K 4.7 01/03/2022   CL 103 01/03/2022   CO2 20 01/03/2022   Lab Results  Component Value Date   ALT 13 01/03/2022   AST 13 01/03/2022   ALKPHOS 99 01/03/2022   BILITOT 0.4 01/03/2022   Lab Results  Component  Value Date   HGBA1C 5.7 (H) 01/03/2022   HGBA1C 5.2 03/10/2020   HGBA1C 5.2 09/29/2019   HGBA1C 5.3 12/04/2018   HGBA1C 5.2 03/04/2018   Lab Results  Component Value Date   INSULIN 12.0 01/03/2022   INSULIN 15.6 03/10/2020   INSULIN 13.5 09/29/2019   INSULIN 14.4 12/04/2018   INSULIN 8.5 03/04/2018   Lab Results  Component Value Date   TSH  1.180 01/03/2022   Lab Results  Component Value Date   CHOL 161 01/03/2022   HDL 64 01/03/2022   LDLCALC 82 01/03/2022   TRIG 82 01/03/2022   CHOLHDL 2.5 01/03/2022   Lab Results  Component Value Date   VD25OH 27.5 (L) 01/03/2022   VD25OH 22.4 (L) 03/10/2020   VD25OH 20.5 (L) 09/29/2019   Lab Results  Component Value Date   WBC 7.5 01/03/2022   HGB 13.1 01/03/2022   HCT 40.5 01/03/2022   MCV 82 01/03/2022   PLT 373 01/03/2022   No results found for: IRON, TIBC, FERRITIN  Attestation Statements:   Reviewed by clinician on day of visit: allergies, medications, problem list, medical history, surgical history, family history, social history, and previous encounter notes.   I, Trixie Dredge, am acting as transcriptionist for Dennard Nip, MD.  I have reviewed the above documentation for accuracy and completeness, and I agree with the above. -  Dennard Nip, MD

## 2022-02-01 ENCOUNTER — Encounter (INDEPENDENT_AMBULATORY_CARE_PROVIDER_SITE_OTHER): Payer: Self-pay | Admitting: Family Medicine

## 2022-02-01 ENCOUNTER — Other Ambulatory Visit: Payer: Self-pay

## 2022-02-01 ENCOUNTER — Ambulatory Visit (INDEPENDENT_AMBULATORY_CARE_PROVIDER_SITE_OTHER): Payer: No Typology Code available for payment source | Admitting: Family Medicine

## 2022-02-01 VITALS — BP 127/70 | HR 85 | Temp 97.9°F | Ht 63.0 in | Wt 256.0 lb

## 2022-02-01 DIAGNOSIS — R7303 Prediabetes: Secondary | ICD-10-CM | POA: Diagnosis not present

## 2022-02-01 DIAGNOSIS — Z6841 Body Mass Index (BMI) 40.0 and over, adult: Secondary | ICD-10-CM

## 2022-02-01 DIAGNOSIS — E559 Vitamin D deficiency, unspecified: Secondary | ICD-10-CM

## 2022-02-01 DIAGNOSIS — F3289 Other specified depressive episodes: Secondary | ICD-10-CM

## 2022-02-01 DIAGNOSIS — E669 Obesity, unspecified: Secondary | ICD-10-CM

## 2022-02-01 DIAGNOSIS — Z9189 Other specified personal risk factors, not elsewhere classified: Secondary | ICD-10-CM

## 2022-02-01 MED ORDER — OZEMPIC (1 MG/DOSE) 4 MG/3ML ~~LOC~~ SOPN: 1.0000 mg | PEN_INJECTOR | SUBCUTANEOUS | 0 refills | Status: DC

## 2022-02-01 MED ORDER — BUPROPION HCL ER (SR) 200 MG PO TB12: 200.0000 mg | ORAL_TABLET | Freq: Two times a day (BID) | ORAL | 0 refills | Status: DC

## 2022-02-02 NOTE — Progress Notes (Signed)
Chief Complaint:   OBESITY Heather Boone is here to discuss her progress with her obesity treatment plan along with follow-up of her obesity related diagnoses. Heather Boone is on keeping a food journal and adhering to recommended goals of 1200-1500 calories and 85+ grams of protein daily and states she is following her eating plan approximately 80% of the time. Heather Boone states she is walking for 30 minutes 4 times per week.  Today's visit was #: 3 Starting weight: 257 lbs Starting date: 01/03/2022 Today's weight: 256 lbs Today's date: 02/01/2022 Total lbs lost to date: 1 Total lbs lost since last in-office visit: 2  Interim History: Heather Boone has done well with weight loss since her last visit. She is struggling with skipping breakfast and cravings for pasta/carbohydrates.  Subjective:   1. Pre-diabetes Heather Boone's last A1c was 5.7. She is taking Ozempic 0.25 mg and metformin 500 mg TID. She denies side effects.  2. Vitamin D deficiency Heather Boone is taking Vit D 50,000 IU weekly, and she denies side effects. Last Vit D level was 27.5, it looks better but it is still low.  3. Other depression, with emotional eating  Heather Boone is struggling with cravings, especially carbohydrates (pasta). She is taking Wellbutrin SR 200 mg, and she denies side effects.  4. At risk for diabetes mellitus Heather Boone is at higher than average risk for developing diabetes due to her obesity.  Assessment/Plan:   1. Pre-diabetes We will refill Ozempic 0.25 mg for 1 month. Heather Boone will continue metformin, and will continue working on dietary changes, exercise, and weight loss to help decrease the risk of diabetes.   - Semaglutide, 1 MG/DOSE, (OZEMPIC, 1 MG/DOSE,) 4 MG/3ML SOPN; Inject 1 mg into the skin once a week.  Dispense: 3 mL; Refill: 0  2. Vitamin D deficiency Low Vitamin D level contributes to fatigue and are associated with obesity, breast, and colon cancer. Heather Boone will continue prescription Vitamin D  50,000 IU every week and will follow-up for routine testing of Vitamin D, at least 2-3 times per year to avoid over-replacement.  3. Other depression, with emotional eating  Behavior modification techniques were discussed today to help Heather Boone deal with her emotional/non-hunger eating behaviors. We will refill Wellbutrin SR for 1 month. Orders and follow up as documented in patient record.   - buPROPion (WELLBUTRIN SR) 200 MG 12 hr tablet; Take 1 tablet (200 mg total) by mouth 2 (two) times daily.  Dispense: 60 tablet; Refill: 0  4. At risk for diabetes mellitus Heather Boone was given approximately 15 minutes of diabetic education and counseling today. We discussed intensive lifestyle modifications today with an emphasis on weight loss as well as increasing exercise and decreasing simple carbohydrates in her diet. We also reviewed medication options with an emphasis on risk versus benefits of those discussed.  Repetitive spaced learning was employed today to elicit superior memory formation and behavioral change.  5. Obesity: Current BMI 45.3 Heather Boone is currently in the action stage of change. As such, her goal is to continue with weight loss efforts. She has agreed to the Category 2 Plan.   Exercise goals: As is.  Behavioral modification strategies: increasing lean protein intake, increasing water intake, and no skipping meals.  Heather Boone has agreed to follow-up with our clinic in 3 weeks. She was informed of the importance of frequent follow-up visits to maximize her success with intensive lifestyle modifications for her multiple health conditions.   Objective:   Blood pressure 127/70, pulse 85, temperature 97.9 F (36.6 C),  height 5\' 3"  (1.6 m), weight 256 lb (116.1 kg), SpO2 99 %. Body mass index is 45.35 kg/m.  General: Cooperative, alert, well developed, in no acute distress. HEENT: Conjunctivae and lids unremarkable. Cardiovascular: Regular rhythm.  Lungs: Normal work of  breathing. Neurologic: No focal deficits.   Lab Results  Component Value Date   CREATININE 0.62 01/03/2022   BUN 8 01/03/2022   NA 139 01/03/2022   K 4.7 01/03/2022   CL 103 01/03/2022   CO2 20 01/03/2022   Lab Results  Component Value Date   ALT 13 01/03/2022   AST 13 01/03/2022   ALKPHOS 99 01/03/2022   BILITOT 0.4 01/03/2022   Lab Results  Component Value Date   HGBA1C 5.7 (H) 01/03/2022   HGBA1C 5.2 03/10/2020   HGBA1C 5.2 09/29/2019   HGBA1C 5.3 12/04/2018   HGBA1C 5.2 03/04/2018   Lab Results  Component Value Date   INSULIN 12.0 01/03/2022   INSULIN 15.6 03/10/2020   INSULIN 13.5 09/29/2019   INSULIN 14.4 12/04/2018   INSULIN 8.5 03/04/2018   Lab Results  Component Value Date   TSH 1.180 01/03/2022   Lab Results  Component Value Date   CHOL 161 01/03/2022   HDL 64 01/03/2022   LDLCALC 82 01/03/2022   TRIG 82 01/03/2022   CHOLHDL 2.5 01/03/2022   Lab Results  Component Value Date   VD25OH 27.5 (L) 01/03/2022   VD25OH 22.4 (L) 03/10/2020   VD25OH 20.5 (L) 09/29/2019   Lab Results  Component Value Date   WBC 7.5 01/03/2022   HGB 13.1 01/03/2022   HCT 40.5 01/03/2022   MCV 82 01/03/2022   PLT 373 01/03/2022   No results found for: IRON, TIBC, FERRITIN  Attestation Statements:   Reviewed by clinician on day of visit: allergies, medications, problem list, medical history, surgical history, family history, social history, and previous encounter notes.   I, Trixie Dredge, am acting as transcriptionist for Dennard Nip, MD.  I have reviewed the above documentation for accuracy and completeness, and I agree with the above. -  Dennard Nip, MD

## 2022-02-17 ENCOUNTER — Other Ambulatory Visit (INDEPENDENT_AMBULATORY_CARE_PROVIDER_SITE_OTHER): Payer: Self-pay | Admitting: Family Medicine

## 2022-02-17 DIAGNOSIS — E559 Vitamin D deficiency, unspecified: Secondary | ICD-10-CM

## 2022-02-20 NOTE — Telephone Encounter (Signed)
LAST APPOINTMENT DATE: 02/01/22 NEXT APPOINTMENT DATE: 02/23/22   [facility] [address] [place], [place] - [address] [place] [address] Phone: [phone] Fax: [phone]  [facility] [place] [address] Phone: [phone] Fax: [phone]  Patient is requesting a refill of the following medications: Requested Prescriptions   Pending Prescriptions Disp Refills   Cholecalciferol (VITAMIN D3) 1.25 MG (50000 UT) CAPS [Pharmacy Med Name: VITAMIN D3 50,000 UNIT CAPSULE] 4 capsule     Sig: TAKE 1 CAPSULE BY MOUTH ONCE A WEEK.    Date last filled: 01/17/22 Previously prescribed by Dr. Leafy Ro  Lab Results  Component Value Date   HGBA1C 5.7 (H) 01/03/2022   HGBA1C 5.2 03/10/2020   HGBA1C 5.2 09/29/2019   Lab Results  Component Value Date   LDLCALC 82 01/03/2022   CREATININE 0.62 01/03/2022   Lab Results  Component Value Date   VD25OH 27.5 (L) 01/03/2022   VD25OH 22.4 (L) 03/10/2020   VD25OH 20.5 (L) 09/29/2019    BP Readings from Last 3 Encounters:  02/01/22 127/70  01/17/22 108/73  01/03/22 117/61

## 2022-02-20 NOTE — Telephone Encounter (Signed)
Last OV with Dr. Beasley 

## 2022-02-23 ENCOUNTER — Ambulatory Visit (INDEPENDENT_AMBULATORY_CARE_PROVIDER_SITE_OTHER): Payer: No Typology Code available for payment source | Admitting: Family Medicine

## 2022-02-23 ENCOUNTER — Other Ambulatory Visit: Payer: Self-pay

## 2022-02-23 ENCOUNTER — Encounter (INDEPENDENT_AMBULATORY_CARE_PROVIDER_SITE_OTHER): Payer: Self-pay | Admitting: Family Medicine

## 2022-02-23 VITALS — BP 122/85 | HR 73 | Temp 97.9°F | Ht 63.0 in | Wt 261.0 lb

## 2022-02-23 DIAGNOSIS — Z9189 Other specified personal risk factors, not elsewhere classified: Secondary | ICD-10-CM

## 2022-02-23 DIAGNOSIS — Z6841 Body Mass Index (BMI) 40.0 and over, adult: Secondary | ICD-10-CM | POA: Diagnosis not present

## 2022-02-23 DIAGNOSIS — E669 Obesity, unspecified: Secondary | ICD-10-CM

## 2022-02-23 DIAGNOSIS — R7303 Prediabetes: Secondary | ICD-10-CM | POA: Diagnosis not present

## 2022-02-23 DIAGNOSIS — E559 Vitamin D deficiency, unspecified: Secondary | ICD-10-CM | POA: Diagnosis not present

## 2022-02-23 MED ORDER — OZEMPIC (1 MG/DOSE) 4 MG/3ML ~~LOC~~ SOPN: 1.0000 mg | PEN_INJECTOR | SUBCUTANEOUS | 0 refills | Status: DC

## 2022-02-27 ENCOUNTER — Telehealth (INDEPENDENT_AMBULATORY_CARE_PROVIDER_SITE_OTHER): Payer: Self-pay | Admitting: Family Medicine

## 2022-02-27 ENCOUNTER — Encounter (INDEPENDENT_AMBULATORY_CARE_PROVIDER_SITE_OTHER): Payer: Self-pay

## 2022-02-27 NOTE — Telephone Encounter (Signed)
Prior authorization denied for Ozempic. Patient does not have type 2 diabetes.  ?

## 2022-02-27 NOTE — Progress Notes (Signed)
? ? ? ?Chief Complaint:  ? ?OBESITY ?Heather Boone is here to discuss her progress with her obesity treatment plan along with follow-up of her obesity related diagnoses. Heather Boone is on the Category 2 Plan and states she is following her eating plan approximately 80% of the time. Heather Boone states she is walking for 30 minutes 7 times per week. ? ?Today's visit was #: 4 ?Starting weight: 257 lbs ?Starting date: 01/03/2022 ?Today's weight: 261 lbs ?Today's date: 02/23/2022 ?Total lbs lost to date: 0 ?Total lbs lost since last in-office visit: 0 ? ?Interim History: Heather Boone is retaining some fluid today. She is working on diet and exercise, but her hunger has increased recently. She is exercising daily by walking her puppy.  ? ?Subjective:  ? ?1. Pre-diabetes ?Heather Boone is stable on Ozempic and metformin. She has been out of Ozempic. ? ?2. Vitamin D deficiency ?Heather Boone's Vit D still below goal. She is on Vit D prescription, with no side effects noted.  ? ?3. At risk for heart disease ?Heather Boone is at higher than average risk for cardiovascular disease due to obesity. ? ?Assessment/Plan:  ? ?1. Pre-diabetes ?We will refill Ozempic 1 mg and we will recheck labs in 2 weeks. Teaghan will continue to work on weight loss, exercise, and decreasing simple carbohydrates to help decrease the risk of diabetes.  ? ?- Semaglutide, 1 MG/DOSE, (OZEMPIC, 1 MG/DOSE,) 4 MG/3ML SOPN; Inject 1 mg into the skin once a week.  Dispense: 3 mL; Refill: 0 ? ?2. Vitamin D deficiency ?Low Vitamin D level contributes to fatigue and are associated with obesity, breast, and colon cancer. Heather Boone will continue prescription Vitamin D 50,000 IU every week and will follow-up for routine testing of Vitamin D, at least 2-3 times per year to avoid over-replacement. ? ?3. At risk for heart disease ?Heather Boone was given approximately 15 minutes of coronary artery disease prevention counseling today. She is 41 y.o. female and has risk factors for heart disease  including obesity. We discussed intensive lifestyle modifications today with an emphasis on specific weight loss instructions and strategies. ? ?Repetitive spaced learning was employed today to elicit superior memory formation and behavioral change.  ? ?4. Obesity: Current BMI 46.3 ?Heather Boone is currently in the action stage of change. As such, her goal is to continue with weight loss efforts. She has agreed to keeping a food journal and adhering to recommended goals of 1200-1500 calories and 85 grams of protein daily.  ? ?Exercise goals: As is. ? ?Behavioral modification strategies: increasing lean protein intake, meal planning and cooking strategies, and travel eating strategies. ? ?Heather Boone has agreed to follow-up with our clinic in 3 weeks. She was informed of the importance of frequent follow-up visits to maximize her success with intensive lifestyle modifications for her multiple health conditions.  ? ?Objective:  ? ?Blood pressure 122/85, pulse 73, temperature 97.9 ?F (36.6 ?C), height '5\' 3"'$  (1.6 m), weight 261 lb (118.4 kg), SpO2 98 %. ?Body mass index is 46.23 kg/m?. ? ?General: Cooperative, alert, well developed, in no acute distress. ?HEENT: Conjunctivae and lids unremarkable. ?Cardiovascular: Regular rhythm.  ?Lungs: Normal work of breathing. ?Neurologic: No focal deficits.  ? ?Lab Results  ?Component Value Date  ? CREATININE 0.62 01/03/2022  ? BUN 8 01/03/2022  ? NA 139 01/03/2022  ? K 4.7 01/03/2022  ? CL 103 01/03/2022  ? CO2 20 01/03/2022  ? ?Lab Results  ?Component Value Date  ? ALT 13 01/03/2022  ? AST 13 01/03/2022  ? ALKPHOS 99 01/03/2022  ?  BILITOT 0.4 01/03/2022  ? ?Lab Results  ?Component Value Date  ? HGBA1C 5.7 (H) 01/03/2022  ? HGBA1C 5.2 03/10/2020  ? HGBA1C 5.2 09/29/2019  ? HGBA1C 5.3 12/04/2018  ? HGBA1C 5.2 03/04/2018  ? ?Lab Results  ?Component Value Date  ? INSULIN 12.0 01/03/2022  ? INSULIN 15.6 03/10/2020  ? INSULIN 13.5 09/29/2019  ? INSULIN 14.4 12/04/2018  ? INSULIN 8.5  03/04/2018  ? ?Lab Results  ?Component Value Date  ? TSH 1.180 01/03/2022  ? ?Lab Results  ?Component Value Date  ? CHOL 161 01/03/2022  ? HDL 64 01/03/2022  ? [place] 82 01/03/2022  ? TRIG 82 01/03/2022  ? CHOLHDL 2.5 01/03/2022  ? ?Lab Results  ?Component Value Date  ? VD25OH 27.5 (L) 01/03/2022  ? VD25OH 22.4 (L) 03/10/2020  ? VD25OH 20.5 (L) 09/29/2019  ? ?Lab Results  ?Component Value Date  ? WBC 7.5 01/03/2022  ? HGB 13.1 01/03/2022  ? HCT 40.5 01/03/2022  ? MCV 82 01/03/2022  ? PLT 373 01/03/2022  ? ?No results found for: IRON, TIBC, FERRITIN ? ?Attestation Statements:  ? ?Reviewed by clinician on day of visit: allergies, medications, problem list, medical history, surgical history, family history, social history, and previous encounter notes. ? ? ?I, Trixie Dredge, am acting as transcriptionist for Dennard Nip, MD. ? ?I have reviewed the above documentation for accuracy and completeness, and I agree with the above. -  Dennard Nip, MD ? ? ?

## 2022-03-07 ENCOUNTER — Other Ambulatory Visit (INDEPENDENT_AMBULATORY_CARE_PROVIDER_SITE_OTHER): Payer: Self-pay | Admitting: Family Medicine

## 2022-03-07 DIAGNOSIS — E559 Vitamin D deficiency, unspecified: Secondary | ICD-10-CM

## 2022-03-07 NOTE — Telephone Encounter (Signed)
Dr.Beasley 

## 2022-03-16 ENCOUNTER — Ambulatory Visit (INDEPENDENT_AMBULATORY_CARE_PROVIDER_SITE_OTHER): Payer: 59 | Admitting: Family Medicine

## 2022-03-30 ENCOUNTER — Ambulatory Visit (INDEPENDENT_AMBULATORY_CARE_PROVIDER_SITE_OTHER): Payer: 59 | Admitting: Family Medicine

## 2022-04-10 ENCOUNTER — Ambulatory Visit (INDEPENDENT_AMBULATORY_CARE_PROVIDER_SITE_OTHER): Payer: 59 | Admitting: Family Medicine

## 2022-04-28 ENCOUNTER — Other Ambulatory Visit (HOSPITAL_BASED_OUTPATIENT_CLINIC_OR_DEPARTMENT_OTHER): Payer: Self-pay

## 2022-04-28 ENCOUNTER — Encounter (HOSPITAL_BASED_OUTPATIENT_CLINIC_OR_DEPARTMENT_OTHER): Payer: Self-pay | Admitting: Emergency Medicine

## 2022-04-28 ENCOUNTER — Other Ambulatory Visit: Payer: Self-pay

## 2022-04-28 ENCOUNTER — Emergency Department (HOSPITAL_BASED_OUTPATIENT_CLINIC_OR_DEPARTMENT_OTHER)
Admission: EM | Admit: 2022-04-28 | Discharge: 2022-04-28 | Disposition: A | Discharge status: Home / Self Care | Payer: No Typology Code available for payment source | Attending: Emergency Medicine | Admitting: Emergency Medicine

## 2022-04-28 DIAGNOSIS — K0889 Other specified disorders of teeth and supporting structures: Secondary | ICD-10-CM | POA: Diagnosis not present

## 2022-04-28 DIAGNOSIS — R220 Localized swelling, mass and lump, head: Secondary | ICD-10-CM | POA: Diagnosis present

## 2022-04-28 MED ORDER — LIDOCAINE VISCOUS HCL 2 % MT SOLN
15.0000 mL | Freq: Once | OROMUCOSAL | Status: AC
  Administered 2022-04-28: 15 mL via OROMUCOSAL
  Filled 2022-04-28: qty 15

## 2022-04-28 MED ORDER — DEXAMETHASONE SODIUM PHOSPHATE 10 MG/ML IJ SOLN
10.0000 mg | Freq: Once | INTRAMUSCULAR | Status: AC
  Administered 2022-04-28: 10 mg
  Filled 2022-04-28: qty 1

## 2022-04-28 MED ORDER — OXYCODONE-ACETAMINOPHEN 5-325 MG PO TABS
1.0000 | ORAL_TABLET | Freq: Once | ORAL | Status: AC
Start: 2022-04-28 — End: 2022-04-28
  Administered 2022-04-28: 1 via ORAL
  Filled 2022-04-28: qty 1

## 2022-04-28 MED ORDER — AMOXICILLIN-POT CLAVULANATE 875-125 MG PO TABS
1.0000 | ORAL_TABLET | Freq: Two times a day (BID) | ORAL | 0 refills | Status: DC
  Filled 2022-04-28: qty 14, 7d supply, fill #0

## 2022-04-28 MED ORDER — LIDOCAINE VISCOUS HCL 2 % MT SOLN: 15.0000 mL | OROMUCOSAL | 0 refills | Status: DC | PRN

## 2022-04-28 MED ORDER — ACETAMINOPHEN 325 MG PO TABS
650.0000 mg | ORAL_TABLET | Freq: Four times a day (QID) | ORAL | 0 refills | Status: AC | PRN
  Filled 2022-04-28: qty 100, 13d supply, fill #0

## 2022-04-28 MED ORDER — OXYCODONE HCL 5 MG PO TABS
5.0000 mg | ORAL_TABLET | ORAL | 0 refills | Status: DC | PRN
Start: 2022-04-28 — End: 2022-06-19
  Filled 2022-04-28: qty 12, 2d supply, fill #0

## 2022-04-28 MED ORDER — AMOXICILLIN-POT CLAVULANATE 875-125 MG PO TABS
1.0000 | ORAL_TABLET | Freq: Once | ORAL | Status: AC
  Administered 2022-04-28: 1 via ORAL
  Filled 2022-04-28: qty 1

## 2022-04-28 MED ORDER — LIDOCAINE VISCOUS HCL 2 % MT SOLN
15.0000 mL | OROMUCOSAL | 0 refills | Status: DC | PRN
  Filled 2022-04-28: qty 100, fill #0

## 2022-04-28 NOTE — ED Provider Notes (Signed)
?[facility] ?Provider Note ? ? ?CSN: [id number] ?Arrival date & time: 04/28/22  0932 ? ?  ? ?History ? ?Chief Complaint  ?Patient presents with  ? Facial Swelling  ? ? ?Heather Boone is a 41 y.o. female. ? ? Patient as above with significant medical history as below, including obesity, heartburn, fatigue who presents to the ED with complaint of right-sided dental pain.  Pain ongoing for the past 2 days.  Worsening pain and swelling to right side of her face, right upper jaw.  She has a broken tooth on the right upper part of her jaw which is very uncomfortable.  Reports she is trying to see a dentist later on today.  No fevers or chills.  No nausea or vomiting.  No dysphagia or dysphonia.  No drooling.  Breathing normally.  No head trauma.  No fevers or chills.  No medications prior to arrival ? ? ? ? ?Past Medical History: ?No date: B12 deficiency ?No date: Back pain ?No date: Constipation ?No date: Fatigue ?No date: Heartburn ?No date: Obesity ?No date: Prediabetes ?No date: SOB (shortness of breath) on exertion ?No date: Vitamin D deficiency ? ?Past Surgical History: ?No date: LAPAROSCOPIC GASTRIC BANDING ?    Comment:  dec 2011  ? ? ?The history is provided by the patient. No language interpreter was used.  ? ?  ? ?Home Medications ?Prior to Admission medications   ?Medication Sig Start Date End Date Taking? Authorizing Provider  ?acetaminophen (TYLENOL) 325 MG tablet Take 2 tablets (650 mg total) by mouth every 6 (six) hours as needed. 04/28/22  Yes Wynona Dove A, DO  ?amoxicillin-clavulanate (AUGMENTIN) 875-125 MG tablet Take 1 tablet by mouth every 12 (twelve) hours. 04/28/22  Yes Wynona Dove A, DO  ?oxyCODONE (ROXICODONE) 5 MG immediate release tablet Take 1 tablet (5 mg total) by mouth every 4 (four) hours as needed for severe pain. 04/28/22  Yes Jeanell Sparrow, DO  ?buPROPion (WELLBUTRIN SR) 200 MG 12 hr tablet Take 1 tablet (200 mg total) by mouth 2 (two) times daily. 02/01/22    Dennard Nip D, MD  ?Cholecalciferol (VITAMIN D3) 1.25 MG (50000 UT) CAPS TAKE 1 CAPSULE BY MOUTH ONCE A WEEK. 02/20/22   Dennard Nip D, MD  ?levothyroxine (SYNTHROID) 25 MCG tablet Take 25 mcg by mouth. 03/16/21   [provider]  ?lidocaine (XYLOCAINE) 2 % solution Use as directed 15 mLs in the mouth or throat as needed for mouth pain. Swish and spit 04/28/22   Wynona Dove A, DO  ?metFORMIN (GLUCOPHAGE) 500 MG tablet Take 1 tablet (500 mg total) by mouth 3 (three) times daily. 03/31/21   Esaw Grandchild, NP  ?Prenatal Vit-Fe Fumarate-FA (PNV PRENATAL PLUS MULTIVITAMIN) 27-1 MG TABS Take 1 tablet by mouth daily. 08/05/20   Princess Bruins, MD  ?Semaglutide, 1 MG/DOSE, (OZEMPIC, 1 MG/DOSE,) 4 MG/3ML SOPN Inject 1 mg into the skin once a week. 02/23/22   Dennard Nip D, MD  ?tinidazole (TINDAMAX) 500 MG tablet Take two tabs po bid x 2 days. 08/24/20   Princess Bruins, MD  ?   ? ?Allergies    ?Aleve [naproxen sodium], Motrin [ibuprofen], and Tramadol   ? ?Review of Systems   ?Review of Systems  ?Constitutional:  Negative for chills and fever.  ?HENT:  Positive for dental problem and facial swelling. Negative for trouble swallowing.   ?Eyes:  Negative for photophobia and visual disturbance.  ?Respiratory:  Negative for cough and shortness of  breath.   ?Cardiovascular:  Negative for chest pain and palpitations.  ?Gastrointestinal:  Negative for abdominal pain, nausea and vomiting.  ?Endocrine: Negative for polydipsia and polyuria.  ?Genitourinary:  Negative for difficulty urinating and hematuria.  ?Musculoskeletal:  Negative for gait problem and joint swelling.  ?Skin:  Negative for pallor and rash.  ?Neurological:  Negative for syncope and headaches.  ?Psychiatric/Behavioral:  Negative for agitation and confusion.   ? ?Physical Exam ?Updated Vital Signs ?BP 126/86   Pulse 74   Temp 98.9 ?F (37.2 ?C) (Oral)   Resp 20   Ht '5\' 3"'$  (1.6 m)   Wt 99.8 kg   LMP 04/20/2022 (Exact Date)   SpO2 100%   BMI  38.97 kg/m?  ?Physical Exam ?Vitals and nursing note reviewed.  ?Constitutional:   ?   General: She is not in acute distress. ?   Appearance: Normal appearance.  ?HENT:  ?   Head: Normocephalic and atraumatic. No raccoon eyes, Battle's sign, right periorbital erythema or left periorbital erythema.  ?   Jaw: There is normal jaw occlusion. No trismus.  ?   Comments: Mild swelling to the right side of face, right cheek ?   Right Ear: External ear normal.  ?   Left Ear: External ear normal.  ?   Nose: Nose normal.  ?   Mouth/Throat:  ?   Mouth: Mucous membranes are moist.  ?   Dentition: Abnormal dentition. Dental caries present.  ?   Pharynx: Uvula midline. No pharyngeal swelling, oropharyngeal exudate or uvula swelling.  ?   Tonsils: No tonsillar abscesses.  ? ?   Comments: No drooling, stridor or trismus ?Eyes:  ?   General: No scleral icterus.    ?   Right eye: No discharge.     ?   Left eye: No discharge.  ?Cardiovascular:  ?   Rate and Rhythm: Normal rate and regular rhythm.  ?   Pulses: Normal pulses.  ?   Heart sounds: Normal heart sounds.  ?Pulmonary:  ?   Effort: Pulmonary effort is normal. No respiratory distress.  ?   Breath sounds: Normal breath sounds.  ?Abdominal:  ?   General: Abdomen is flat.  ?   Tenderness: There is no abdominal tenderness.  ?Musculoskeletal:     ?   General: Normal range of motion.  ?   Cervical back: Normal range of motion.  ?   Right lower leg: No edema.  ?   Left lower leg: No edema.  ?Skin: ?   General: Skin is warm and dry.  ?   Capillary Refill: Capillary refill takes less than 2 seconds.  ?Neurological:  ?   Mental Status: She is alert.  ?Psychiatric:     ?   Mood and Affect: Mood normal.     ?   Behavior: Behavior normal.  ? ? ?ED Results / Procedures / Treatments   ?Labs ?(all labs ordered are listed, but only abnormal results are displayed) ?Labs Reviewed - No data to display ? ?EKG ?None ? ?Radiology ?No results found. ? ?Procedures ?Procedures  ? ? ?Medications Ordered in  ED ?Medications  ?oxyCODONE-acetaminophen (PERCOCET/ROXICET) 5-325 MG per tablet 1 tablet (1 tablet Oral Given 04/28/22 0941)  ?dexamethasone (DECADRON) injection 10 mg (10 mg Other Given 04/28/22 0941)  ?lidocaine (XYLOCAINE) 2 % viscous mouth solution 15 mL (15 mLs Mouth/Throat Given 04/28/22 0941)  ?amoxicillin-clavulanate (AUGMENTIN) 875-125 MG per tablet 1 tablet (1 tablet Oral Given 04/28/22 0941)  ? ? ?ED  Course/ Medical Decision Making/ A&P ?  ?                        ?Medical Decision Making ?Risk ?OTC drugs. ?Prescription drug management. ? ? ? ?CC: Dental pain ? ?This patient presents to the Emergency Department for the above complaint. This involves an extensive number of treatment options and is a complaint that carries with it a high risk of complications and morbidity. Vital signs were reviewed. Serious etiologies considered. ? ?Record review:  ?Previous records obtained and reviewed  ? ?Medical and surgical history as noted above.  ? ?Exam concerning for possible dental abscess, dental infection.  She has dental caries, broken tooth at point of discomfort.  No discrete drainable abscess on exam.  No evidence of deep space infection in the pharynx.  No drooling, stridor or trismus.  She is breathing comfortably on ambient air.  We will start antibiotics in the ED, give analgesics.  Allergy list was reviewed.  Patient reports that she will follow-up with her dentist this afternoon.  Given dental resources as well.  Instructions for home care.  Return precautions were discussed ? ? ?Management: ?Give augmentin, analgesics, she did not want dental block ? ? ?The patient improved significantly and was discharged in stable condition. Detailed discussions were had with the patient regarding current findings, and need for close f/u with PCP or on call doctor. The patient has been instructed to return immediately if the symptoms worsen in any way for re-evaluation. Patient verbalized understanding and is in agreement  with current care plan. All questions answered prior to discharge. ? ? ? ? ? ? ? ? ? ? ? ? ? ? ? ?Social determinants of health include -  ? ?Social History  ? ?Socioeconomic History  ? Marital status: Single

## 2022-04-28 NOTE — ED Triage Notes (Signed)
Right facial swelling this am , reports possible tooth decay same side .  ?

## 2022-04-28 NOTE — Discharge Instructions (Addendum)
It was a pleasure caring for you today in the emergency department. ? ?Please return to the emergency department for any worsening or worrisome symptoms. ? ?Please use Listerine swish and spit 3 times daily. ?Gargle with warm salt water as needed ?Please follow-up with dentist today ?Return to emergency department if worse ? ? ?

## 2022-05-03 ENCOUNTER — Encounter (INDEPENDENT_AMBULATORY_CARE_PROVIDER_SITE_OTHER): Payer: Self-pay | Admitting: Family Medicine

## 2022-05-03 ENCOUNTER — Ambulatory Visit (INDEPENDENT_AMBULATORY_CARE_PROVIDER_SITE_OTHER): Payer: No Typology Code available for payment source | Admitting: Family Medicine

## 2022-05-03 VITALS — BP 129/82 | HR 62 | Temp 97.9°F | Ht 63.0 in | Wt 257.0 lb

## 2022-05-03 DIAGNOSIS — F3289 Other specified depressive episodes: Secondary | ICD-10-CM

## 2022-05-03 DIAGNOSIS — E669 Obesity, unspecified: Secondary | ICD-10-CM | POA: Diagnosis not present

## 2022-05-03 DIAGNOSIS — Z9189 Other specified personal risk factors, not elsewhere classified: Secondary | ICD-10-CM | POA: Insufficient documentation

## 2022-05-03 DIAGNOSIS — Z6841 Body Mass Index (BMI) 40.0 and over, adult: Secondary | ICD-10-CM

## 2022-05-03 DIAGNOSIS — R400 Somnolence: Secondary | ICD-10-CM

## 2022-05-03 DIAGNOSIS — E559 Vitamin D deficiency, unspecified: Secondary | ICD-10-CM

## 2022-05-03 MED ORDER — SEMAGLUTIDE-WEIGHT MANAGEMENT 1 MG/0.5ML ~~LOC~~ SOAJ: 1.0000 mg | SUBCUTANEOUS | 0 refills | Status: DC

## 2022-05-03 MED ORDER — VITAMIN D3 1.25 MG (50000 UT) PO CAPS: 1.0000 | ORAL_CAPSULE | ORAL | 0 refills | Status: DC

## 2022-05-03 MED ORDER — BUPROPION HCL ER (SR) 200 MG PO TB12: 200.0000 mg | ORAL_TABLET | Freq: Two times a day (BID) | ORAL | 0 refills | Status: DC

## 2022-05-04 ENCOUNTER — Telehealth (INDEPENDENT_AMBULATORY_CARE_PROVIDER_SITE_OTHER): Payer: Self-pay | Admitting: Family Medicine

## 2022-05-04 NOTE — Telephone Encounter (Signed)
Dr.Beasley 

## 2022-05-04 NOTE — Telephone Encounter (Signed)
Received call from [place], [place] [address] :  PLEASE SEND TO [place] Patient insurance is NOT in-network with [facility], therefore, they were unable to fill [facility].     Patient is unaware due to being unable to reach her,  pt voicemail box is full.

## 2022-05-10 ENCOUNTER — Encounter (INDEPENDENT_AMBULATORY_CARE_PROVIDER_SITE_OTHER): Payer: Self-pay | Admitting: Family Medicine

## 2022-05-10 MED ORDER — SEMAGLUTIDE-WEIGHT MANAGEMENT 1 MG/0.5ML ~~LOC~~ SOAJ: 1.0000 mg | SUBCUTANEOUS | 0 refills | Status: DC

## 2022-05-10 NOTE — Addendum Note (Signed)
Addended by: Ronaldo Miyamoto on: 05/10/2022 02:41 PM ? ? Modules accepted: Orders ? ?

## 2022-05-11 NOTE — Telephone Encounter (Signed)
Dr.Beasley 

## 2022-05-15 ENCOUNTER — Encounter (INDEPENDENT_AMBULATORY_CARE_PROVIDER_SITE_OTHER): Payer: Self-pay

## 2022-05-15 ENCOUNTER — Telehealth (INDEPENDENT_AMBULATORY_CARE_PROVIDER_SITE_OTHER): Payer: Self-pay | Admitting: Family Medicine

## 2022-05-15 NOTE — Telephone Encounter (Signed)
Dr. Leafy Ro- Prior authorization denied for [facility]. Per insurance:*Drug Not Covered/Plan Exclusion. Patient sent denial message via mychart.

## 2022-05-15 NOTE — Telephone Encounter (Signed)
See message in chart to patient. Thanks!

## 2022-05-17 NOTE — Progress Notes (Signed)
Chief Complaint:   OBESITY Heather Boone is here to discuss her progress with her obesity treatment plan along with follow-up of her obesity related diagnoses. Heather Boone is on keeping a food journal and adhering to recommended goals of 1200-1500 calories and 85 grams of protein daily and states she is following her eating plan approximately 80% of the time. Heather Boone states she is walking for 30 minutes 7 times per week.  Today's visit was #: 5 Starting weight: 257 lbs Starting date: 01/03/2022 Today's weight: 257 lbs Today's date: 05/03/2022 Total lbs lost to date: 0 Total lbs lost since last in-office visit: 4  Interim History: Heather Boone continues to do well with weight loss. Her insurance will no longer cover Ozempic and she wonders if she can start [facility] instead.   Subjective:   1. Daytime somnolence Heather Boone is getting adequate sleep between 7-8 hours per night. She wakes up unrefreshed and can fall asleep easily in the daytime.  2. Vitamin D deficiency Heather Boone is on Vitamin D, and she denies nausea, vomiting, or muscle weakness. Her level is not yet at goal.   3. Other depression, with emotional eating  Heather Boone is working on decreasing emotional eating behaviors. She is stable on her medications, and her blood pressure is controlled.   4. At risk for heart disease Heather Boone is at higher than average risk for cardiovascular disease due to obesity.  Assessment/Plan:   1. Daytime somnolence We will refer Heather Boone to [facility] for evaluation with Dr. Brett Fairy.   2. Vitamin D deficiency We will refill prescription Vitamin D for 1 month. We will recheck labs at her next visit. Kerstie will follow-up for routine testing of Vitamin D, at least 2-3 times per year to avoid over-replacement.  - Cholecalciferol (VITAMIN D3) 1.25 MG (50000 UT) CAPS; Take 1 capsule by mouth once a week.  Dispense: 4 capsule; Refill: 0  3. Other depression, with emotional eating  We will refill Wellbutrin  SR for 1 month. Emotional eating behavior pathology and strategies were discussed.   - buPROPion (WELLBUTRIN SR) 200 MG 12 hr tablet; Take 1 tablet (200 mg total) by mouth 2 (two) times daily.  Dispense: 60 tablet; Refill: 0  4. At risk for heart disease Heather Boone was given approximately 15 minutes of coronary artery disease prevention counseling today. She is 41 y.o. female and has risk factors for heart disease including obesity. We discussed intensive lifestyle modifications today with an emphasis on specific weight loss instructions and strategies.  Repetitive spaced learning was employed today to elicit superior memory formation and behavioral change.   5. Obesity, Current BMI 45.7 Heather Boone is currently in the action stage of change. As such, her goal is to continue with weight loss efforts. She has agreed to keeping a food journal and adhering to recommended goals of 1200-1500 calories and 85+ grams of protein daily.   We discussed various medication options to help Heather Boone with her weight loss efforts and we both agreed to start [facility] 1 mg q week with no refills.  We will recheck fasting labs at her next visit.  Exercise goals: As is.  Behavioral modification strategies: increasing lean protein intake, meal planning and cooking strategies, and emotional eating strategies.  Heather Boone has agreed to follow-up with our clinic in 2 to 3 weeks. She was informed of the importance of frequent follow-up visits to maximize her success with intensive lifestyle modifications for her multiple health conditions.   Objective:   Blood pressure 129/82, pulse 62, temperature  97.9 F (36.6 C), height '5\' 3"'$  (1.6 m), weight 257 lb (116.6 kg), last menstrual period 04/20/2022, SpO2 100 %. Body mass index is 45.53 kg/m.  General: Cooperative, alert, well developed, in no acute distress. HEENT: Conjunctivae and lids unremarkable. Cardiovascular: Regular rhythm.  Lungs: Normal work of  breathing. Neurologic: No focal deficits.   Lab Results  Component Value Date   CREATININE 0.62 01/03/2022   BUN 8 01/03/2022   NA 139 01/03/2022   K 4.7 01/03/2022   CL 103 01/03/2022   CO2 20 01/03/2022   Lab Results  Component Value Date   ALT 13 01/03/2022   AST 13 01/03/2022   ALKPHOS 99 01/03/2022   BILITOT 0.4 01/03/2022   Lab Results  Component Value Date   HGBA1C 5.7 (H) 01/03/2022   HGBA1C 5.2 03/10/2020   HGBA1C 5.2 09/29/2019   HGBA1C 5.3 12/04/2018   HGBA1C 5.2 03/04/2018   Lab Results  Component Value Date   INSULIN 12.0 01/03/2022   INSULIN 15.6 03/10/2020   INSULIN 13.5 09/29/2019   INSULIN 14.4 12/04/2018   INSULIN 8.5 03/04/2018   Lab Results  Component Value Date   TSH 1.180 01/03/2022   Lab Results  Component Value Date   CHOL 161 01/03/2022   HDL 64 01/03/2022   LDLCALC 82 01/03/2022   TRIG 82 01/03/2022   CHOLHDL 2.5 01/03/2022   Lab Results  Component Value Date   VD25OH 27.5 (L) 01/03/2022   VD25OH 22.4 (L) 03/10/2020   VD25OH 20.5 (L) 09/29/2019   Lab Results  Component Value Date   WBC 7.5 01/03/2022   HGB 13.1 01/03/2022   HCT 40.5 01/03/2022   MCV 82 01/03/2022   PLT 373 01/03/2022   No results found for: IRON, TIBC, FERRITIN  Attestation Statements:   Reviewed by clinician on day of visit: allergies, medications, problem list, medical history, surgical history, family history, social history, and previous encounter notes.   I, Trixie Dredge, am acting as transcriptionist for Dennard Nip, MD.  I have reviewed the above documentation for accuracy and completeness, and I agree with the above. -  Dennard Nip, MD

## 2022-05-17 NOTE — Telephone Encounter (Signed)
The denial is through current Haematologist, which uses Film/video editor for pharmacy benefits. Thanks!

## 2022-05-17 NOTE — Telephone Encounter (Signed)
Please review

## 2022-05-18 NOTE — Telephone Encounter (Signed)
Please review

## 2022-05-23 NOTE — Telephone Encounter (Signed)
Dr. Leafy Ro - I have contacted patient pharmacy insurance [employer] to follow up on call 05/18/22 to assist with prior authorization insurance issue that patient made [place] aware of. I received a faxed form requesting more information. I gave additional information requested by phone. Call reference # [id number]. Waiting on response from insurance in reference to approval or denial.

## 2022-05-24 ENCOUNTER — Encounter (INDEPENDENT_AMBULATORY_CARE_PROVIDER_SITE_OTHER): Payer: Self-pay

## 2022-05-25 ENCOUNTER — Encounter (INDEPENDENT_AMBULATORY_CARE_PROVIDER_SITE_OTHER): Payer: Self-pay | Admitting: Family Medicine

## 2022-05-25 ENCOUNTER — Telehealth (INDEPENDENT_AMBULATORY_CARE_PROVIDER_SITE_OTHER): Payer: Self-pay | Admitting: Family Medicine

## 2022-05-25 ENCOUNTER — Ambulatory Visit (INDEPENDENT_AMBULATORY_CARE_PROVIDER_SITE_OTHER): Payer: No Typology Code available for payment source | Admitting: Family Medicine

## 2022-05-25 VITALS — BP 114/76 | HR 68 | Temp 97.9°F | Ht 63.0 in | Wt 257.0 lb

## 2022-05-25 DIAGNOSIS — R7303 Prediabetes: Secondary | ICD-10-CM | POA: Diagnosis not present

## 2022-05-25 DIAGNOSIS — E669 Obesity, unspecified: Secondary | ICD-10-CM | POA: Diagnosis not present

## 2022-05-25 DIAGNOSIS — E559 Vitamin D deficiency, unspecified: Secondary | ICD-10-CM

## 2022-05-25 DIAGNOSIS — Z9189 Other specified personal risk factors, not elsewhere classified: Secondary | ICD-10-CM

## 2022-05-25 DIAGNOSIS — Z6841 Body Mass Index (BMI) 40.0 and over, adult: Secondary | ICD-10-CM | POA: Diagnosis not present

## 2022-05-25 DIAGNOSIS — Z7984 Long term (current) use of oral hypoglycemic drugs: Secondary | ICD-10-CM

## 2022-05-25 DIAGNOSIS — Z7985 Long-term (current) use of injectable non-insulin antidiabetic drugs: Secondary | ICD-10-CM

## 2022-05-25 MED ORDER — SEMAGLUTIDE-WEIGHT MANAGEMENT 1.7 MG/0.75ML ~~LOC~~ SOAJ: 1.7000 mg | SUBCUTANEOUS | 0 refills | Status: DC

## 2022-05-25 NOTE — Telephone Encounter (Signed)
Dr. Leafy Ro - Prior authorization approved for [facility]. Effective: 05/24/2022 - 12/24/2022. Patient is already aware of approval .

## 2022-05-25 NOTE — Telephone Encounter (Signed)
Please review

## 2022-05-25 NOTE — Telephone Encounter (Signed)
Dr.Beasley 

## 2022-05-31 NOTE — Progress Notes (Signed)
Chief Complaint:   OBESITY Heather Boone is here to discuss her progress with her obesity treatment plan along with follow-up of her obesity related diagnoses. Heather Boone is on keeping a food journal and adhering to recommended goals of 1200-1500 calories and 85+ grams of protein daily and states she is following her eating plan approximately 80% of the time. Heather Boone states she is walking for 90 minutes 7 times per week.  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 She is going to trying to meal plan and track nutrition very closely the next 2 weeks.   Subjective:   1. Pre-diabetes Heather Boone is on metformin and GLP--1. She will have to increase her GLP-1 due to shortages.  2. Vitamin D deficiency Heather Boone is on Vitamin D, and her level is not yet at goal. She is due for labs soon.   3. At risk for diabetes mellitus Heather Boone is at higher than average risk for developing diabetes due to her obesity.  Assessment/Plan:   1. Pre-diabetes Heather Boone agreed to discontinue metformin for now, and we will recheck labs at her next visit.   2. Vitamin D deficiency Heather Boone will continue Vitamin D, and we will plan to recheck labs in 1 months.   3. At risk for diabetes mellitus Heather Boone was given approximately 15 minutes of diabetic education and counseling today. We discussed intensive lifestyle modifications today with an emphasis on weight loss as well as increasing exercise and decreasing simple carbohydrates in her diet. We also reviewed medication options with an emphasis on risk versus benefits of those discussed.  Repetitive spaced learning was employed today to elicit superior memory formation and behavioral change.  4. Obesity, Current BMI 45.6 Heather Boone  is currently in the action stage of change. As such, her goal is to continue with weight loss efforts. She has agreed to keeping a food journal and adhering to recommended goals of 1200-1500 calories and 85+ grams of protein daily.   We will recheck fasting labs at her next visit.  We discussed various medication options to help Heather Boone with her weight loss efforts and we both agreed to increase Wegovy to 1.7 mg (due to Environmental consultant.  - Semaglutide-Weight Management 1.7 MG/0.75ML SOAJ; Inject 1.7 mg into the skin once a week.  Dispense: 3 mL; Refill: 0  Exercise goals: As is.  Behavioral modification strategies: increasing lean protein intake, decreasing simple carbohydrates, and meal planning and cooking strategies.  Heather Boone has agreed to follow-up with our clinic in 4 weeks. She was informed of the importance of frequent follow-up visits to maximize her success with intensive lifestyle modifications for her multiple health conditions.   Objective:   Blood pressure 114/76, pulse 68, temperature 97.9 F (36.6 C), height '5\' 3"'$  (1.6 m), weight 257 lb (116.6 kg), SpO2 99 %. Body mass index is 45.53 kg/m.  General: Cooperative, alert, well developed, in no acute distress. HEENT: Conjunctivae and lids unremarkable. Cardiovascular: Regular rhythm.  Lungs: Normal work of breathing. Neurologic: No focal deficits.   Lab Results  Component Value Date   CREATININE 0.62 01/03/2022   BUN 8 01/03/2022   NA 139 01/03/2022   K 4.7 01/03/2022   CL 103 01/03/2022   CO2 20 01/03/2022   Lab Results  Component Value Date  ALT 13 01/03/2022   AST 13 01/03/2022   ALKPHOS 99 01/03/2022   BILITOT 0.4 01/03/2022   Lab Results  Component Value Date   HGBA1C 5.7 (H) 01/03/2022   HGBA1C 5.2 03/10/2020   HGBA1C 5.2 09/29/2019   HGBA1C 5.3 12/04/2018   HGBA1C 5.2 03/04/2018   Lab Results  Component Value Date   INSULIN 12.0 01/03/2022   INSULIN 15.6 03/10/2020   INSULIN 13.5  09/29/2019   INSULIN 14.4 12/04/2018   INSULIN 8.5 03/04/2018   Lab Results  Component Value Date   TSH 1.180 01/03/2022   Lab Results  Component Value Date   CHOL 161 01/03/2022   HDL 64 01/03/2022   LDLCALC 82 01/03/2022   TRIG 82 01/03/2022   CHOLHDL 2.5 01/03/2022   Lab Results  Component Value Date   VD25OH 27.5 (L) 01/03/2022   VD25OH 22.4 (L) 03/10/2020   VD25OH 20.5 (L) 09/29/2019   Lab Results  Component Value Date   WBC 7.5 01/03/2022   HGB 13.1 01/03/2022   HCT 40.5 01/03/2022   MCV 82 01/03/2022   PLT 373 01/03/2022   No results found for: IRON, TIBC, FERRITIN  Attestation Statements:   Reviewed by clinician on day of visit: allergies, medications, problem list, medical history, surgical history, family history, social history, and previous encounter notes.   I, Trixie Dredge, am acting as transcriptionist for Dennard Nip, MD.  I have reviewed the above documentation for accuracy and completeness, and I agree with the above. -  Dennard Nip, MD

## 2022-06-19 ENCOUNTER — Encounter: Payer: Self-pay | Admitting: Obstetrics & Gynecology

## 2022-06-19 ENCOUNTER — Ambulatory Visit (INDEPENDENT_AMBULATORY_CARE_PROVIDER_SITE_OTHER): Payer: No Typology Code available for payment source | Admitting: Obstetrics & Gynecology

## 2022-06-19 ENCOUNTER — Other Ambulatory Visit (HOSPITAL_COMMUNITY)
Admission: RE | Admit: 2022-06-19 | Discharge: 2022-06-19 | Disposition: A | Discharge status: Home / Self Care | Payer: 59 | Source: Ambulatory Visit | Attending: Obstetrics & Gynecology | Admitting: Obstetrics & Gynecology

## 2022-06-19 VITALS — BP 108/72 | HR 85 | Ht 63.0 in | Wt 260.0 lb

## 2022-06-19 DIAGNOSIS — Z01419 Encounter for gynecological examination (general) (routine) without abnormal findings: Secondary | ICD-10-CM

## 2022-06-19 DIAGNOSIS — N979 Female infertility, unspecified: Secondary | ICD-10-CM | POA: Diagnosis not present

## 2022-06-19 DIAGNOSIS — N926 Irregular menstruation, unspecified: Secondary | ICD-10-CM

## 2022-06-19 DIAGNOSIS — Z6841 Body Mass Index (BMI) 40.0 and over, adult: Secondary | ICD-10-CM

## 2022-06-19 LAB — PREGNANCY, URINE: Preg Test, Ur: NEGATIVE

## 2022-06-20 LAB — PROLACTIN: Prolactin: 12.9 ng/mL

## 2022-06-20 LAB — TSH: TSH: 1.82 mIU/L

## 2022-06-21 LAB — CYTOLOGY - PAP
Diagnosis: NEGATIVE
Diagnosis: REACTIVE

## 2022-06-22 ENCOUNTER — Other Ambulatory Visit: Payer: Self-pay | Admitting: *Deleted

## 2022-06-22 ENCOUNTER — Ambulatory Visit (INDEPENDENT_AMBULATORY_CARE_PROVIDER_SITE_OTHER): Payer: 59 | Admitting: Family Medicine

## 2022-06-22 MED ORDER — TINIDAZOLE 500 MG PO TABS: ORAL_TABLET | ORAL | 0 refills | Status: DC

## 2022-07-09 ENCOUNTER — Encounter (INDEPENDENT_AMBULATORY_CARE_PROVIDER_SITE_OTHER): Payer: Self-pay | Admitting: Family Medicine

## 2022-07-11 ENCOUNTER — Ambulatory Visit (INDEPENDENT_AMBULATORY_CARE_PROVIDER_SITE_OTHER): Payer: 59 | Admitting: Family Medicine

## 2022-08-02 ENCOUNTER — Encounter (INDEPENDENT_AMBULATORY_CARE_PROVIDER_SITE_OTHER): Payer: Self-pay

## 2022-08-17 ENCOUNTER — Ambulatory Visit (INDEPENDENT_AMBULATORY_CARE_PROVIDER_SITE_OTHER): Payer: No Typology Code available for payment source | Admitting: Obstetrics & Gynecology

## 2022-08-17 ENCOUNTER — Encounter: Payer: Self-pay | Admitting: Obstetrics & Gynecology

## 2022-08-17 ENCOUNTER — Ambulatory Visit: Payer: No Typology Code available for payment source

## 2022-08-17 ENCOUNTER — Telehealth: Payer: Self-pay

## 2022-08-17 VITALS — BP 120/78 | HR 76

## 2022-08-17 DIAGNOSIS — R1900 Intra-abdominal and pelvic swelling, mass and lump, unspecified site: Secondary | ICD-10-CM

## 2022-08-17 DIAGNOSIS — Z6841 Body Mass Index (BMI) 40.0 and over, adult: Secondary | ICD-10-CM | POA: Diagnosis not present

## 2022-08-17 DIAGNOSIS — N926 Irregular menstruation, unspecified: Secondary | ICD-10-CM | POA: Diagnosis not present

## 2022-08-17 DIAGNOSIS — N979 Female infertility, unspecified: Secondary | ICD-10-CM | POA: Diagnosis not present

## 2022-08-17 DIAGNOSIS — R1909 Other intra-abdominal and pelvic swelling, mass and lump: Secondary | ICD-10-CM

## 2022-08-17 NOTE — Progress Notes (Signed)
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  RP: Menometrorrhagia for Pelvic US  HPI: No change x last visit 06/19/22:  Menses regular normal to heavy flow every month until May 2023.  No cycle in May and only spotting in June for a couple days which is not normal for her. Has done UPT at home which was negative after missed cycle in May.  No pelvic pain.  No pain with IC. Pap Neg 07/2020.  Pap reflex Neg 06/19/22.   OB History  Gravida Para Term Preterm AB Living  1 0     1 0  SAB IAB Ectopic Multiple Live Births    1          # Outcome Date GA Lbr Len/2nd Weight Sex Delivery Anes PTL Lv  1 IAB             Past medical history,surgical history, problem list, medications, allergies, family history and social history were all reviewed and documented in the EPIC chart.   Directed ROS with pertinent positives and negatives documented in the history of present illness/assessment and plan.  Exam:  There were no vitals filed for this visit. General appearance:  Normal  Pelvic US today: T/V images.  Anteverted uterus normal in size and shape with an echogenic vascular mass seen from the right lateral aspect of the uterus measuring approximately 7.2 x 3.5 x 5.0 cm.  Difficult to determine mine if the origin is uterine or right ovarian.  The overall uterine size is measured at 7.18 x 4.54 x 3.43 cm.  The endometrial lining is sec. in appearance measuring approximately 7.18 mm with no obvious mass or thickening.  The left ovary is slightly enlarged with positive perfusion.  Simple avascular dominant follicle is seen on the left ovary.  The right ovary is not definitively seen.  Possible right ovary measured posterior to the mass however very difficult to clearly identify.  That area had positive perfusion and measured normal in size for an ovary.  Trace free fluid in the left adnexa and posterior cul-de-sac.   Assessment/Plan:  41 y.o.  G1P0010   1. Irregular menses No change x last visit 06/19/22:  Menses regular normal to heavy flow every month until May 2023.  No cycle in May and only spotting in June for a couple days which is not normal for her. Has done UPT at home which was negative after missed cycle in May.  No pelvic pain.  No pain with IC. Pap Neg 07/2020.  Pap reflex Neg 06/19/22.  Pelvic US findings thoroughly reviewed with patient.  Normal endometrial line.  Will further investigate the pelvic mass with a Pelvic MRI.  2. Pelvic mass in female Echogenic vascular mass seen from the right lateral aspect of the uterus measuring approximately 7.2 x 3.5 x 5.0 cm.  Difficult to determine mine if the origin is uterine or right ovarian.  Pelvic MRI scheduled.  3. Primary female infertility Has been on Clomid before, but not taken x many months.  Fiance had a low Sperm count, seen by Urology and treated with Clomid.  Took care of her mom until she died of lung Ca in 2021-12-22.  Will see Dr Mariea Clonts again at [facility] when ready to attempt conception.    4. Class 3 severe obesity due to excess calories without serious comorbidity with body mass index (BMI) of 45.0 to 49.9 in adult (  [place]) Low calorie/increased fitness activities.  Other orders - albuterol (VENTOLIN HFA) 108 (90 Base) MCG/ACT inhaler; SMARTSIG:1-2 Puff(s) Via Inhaler Every 4-6 Hours PRN  [name], 10:17 AM 08/17/2022

## 2022-08-17 NOTE — Telephone Encounter (Signed)
Schedule an MRI of the pelvis/Lower abdomen Received: Today Heather Bruins, MD  [facility] Triage Large vascularized Rt pelvic/lower abdominal solid mass.  Rt ovary not well identified.  Origin and nature of the solid mass uncertain.

## 2022-08-17 NOTE — Telephone Encounter (Signed)
MRI Abd wwo contrast and MRI pelvic wwo contrast is scheduled for 08/31/22 at 2:50pm at [facility].  Will route to Jaylan/Miranda for PA check.

## 2022-08-17 NOTE — Telephone Encounter (Signed)
Called patient and provided phone number to [place] so she can call and answer screening questions and schedule MRI.

## 2022-08-17 NOTE — Telephone Encounter (Signed)
Placed orders in Epic. Spoke with MRI scheduler, Karena Addison, to be sure orders look fine.  She said all okay and they will call patient to schedule/review screening questions.  I will monitor for date so I can send for PA inquiry.

## 2022-08-24 ENCOUNTER — Encounter: Payer: Self-pay | Admitting: Obstetrics & Gynecology

## 2022-08-29 ENCOUNTER — Telehealth: Payer: Self-pay

## 2022-08-29 ENCOUNTER — Other Ambulatory Visit: Payer: Self-pay

## 2022-08-29 NOTE — Telephone Encounter (Signed)
Patient is scheduled 08/31/22 for MRI's.  PA required by noon tomorrow or appts to be cancelled. Per Butch Penny route to Jaylan/Miranda again. I routed on 8.24.23 previous encounter.

## 2022-08-31 ENCOUNTER — Ambulatory Visit
Admission: RE | Admit: 2022-08-31 | Discharge: 2022-08-31 | Disposition: A | Discharge status: Home / Self Care | Payer: 59 | Source: Ambulatory Visit | Attending: Obstetrics & Gynecology | Admitting: Obstetrics & Gynecology

## 2022-08-31 ENCOUNTER — Ambulatory Visit
Admission: RE | Admit: 2022-08-31 | Discharge: 2022-08-31 | Disposition: A | Discharge status: Home / Self Care | Payer: No Typology Code available for payment source | Source: Ambulatory Visit | Attending: Obstetrics & Gynecology | Admitting: Obstetrics & Gynecology

## 2022-08-31 DIAGNOSIS — R1909 Other intra-abdominal and pelvic swelling, mass and lump: Secondary | ICD-10-CM

## 2022-08-31 DIAGNOSIS — R1900 Intra-abdominal and pelvic swelling, mass and lump, unspecified site: Secondary | ICD-10-CM

## 2022-08-31 MED ORDER — GADOPICLENOL 0.5 MMOL/ML IV SOLN
10.0000 mL | Freq: Once | INTRAVENOUS | Status: AC | PRN
Start: 2022-08-31 — End: 2022-08-31
  Administered 2022-08-31: 10 mL via INTRAVENOUS

## 2022-09-04 ENCOUNTER — Telehealth: Payer: Self-pay | Admitting: *Deleted

## 2022-09-04 ENCOUNTER — Telehealth: Payer: Self-pay

## 2022-09-04 DIAGNOSIS — N9489 Other specified conditions associated with female genital organs and menstrual cycle: Secondary | ICD-10-CM

## 2022-09-04 NOTE — Telephone Encounter (Signed)
From: Princess Bruins, MD  Sent: 09/04/2022  12:51 PM EDT  To: Ramond Craver, RMA; *  Subject: Refer to Gyneco-Oncology                       MRI shows a 6.9 cm solid mass in the right adnexa with nonspecific characteristics, but is suspicious for a solid ovarian neoplasm. Surgical evaluation should be considered. No evidence of pelvic metastatic disease.  Please organize a referral with Gyneco-Onco for management and investigation.  Offer patient a visit with me ASAP to patient's convenience to discuss the MRI findings before the Gyn-Onco visit.    Spoke with patient. Advised of results per Dr. Dellis Filbert.  Patient agreeable to proceed with GYN ONC consult, urgent referral placed to [place]. Advised patient she will be notified of appt details once scheduled.   MyChart visit scheduled with Dr. Dellis Filbert for 9/12 at 4pm. Process of MyChart visit reviewed with patient.  Patient aware to contact office if any additional questions or concerns.   Routing to [place] Triage to coordinate GNY ONC appt.

## 2022-09-04 NOTE — Telephone Encounter (Signed)
Burnice Logan, RN  Ramond Craver, RMA Patient notified of results.  Urgent referral placed and routed in referral WQ.  Routed to [place] Triage to f/u with scheduling.        ----- Message -----  From: Ramond Craver, RMA  Sent: 09/04/2022   1:46 PM EDT  To: Burnice Logan, RN  Subject: FW: Refer to Gyneco-Oncology                   I am happy to coordinate this referral.  Will you please call her and let her know about the solid ovarian neoplasm and need for gyn onc visit. I am not comfortable delivering that news.  ----- Message -----  From: Princess Bruins, MD  Sent: 09/04/2022  12:51 PM EDT  To: Ramond Craver, RMA; *  Subject: Refer to Gyneco-Oncology                       MRI shows a 6.9 cm solid mass in the right adnexa with nonspecific characteristics, but is suspicious for a solid ovarian neoplasm. Surgical evaluation should be considered. No evidence of pelvic metastatic disease.  Please organize a referral with Gyneco-Onco for management and investigation.  Offer patient a visit with me ASAP to patient's convenience to discuss the MRI findings before the Gyn-Onco visit.

## 2022-09-05 ENCOUNTER — Encounter: Payer: Self-pay | Admitting: Obstetrics & Gynecology

## 2022-09-05 ENCOUNTER — Telehealth (INDEPENDENT_AMBULATORY_CARE_PROVIDER_SITE_OTHER): Payer: No Typology Code available for payment source | Admitting: Obstetrics & Gynecology

## 2022-09-05 DIAGNOSIS — N9489 Other specified conditions associated with female genital organs and menstrual cycle: Secondary | ICD-10-CM

## 2022-09-05 NOTE — Progress Notes (Signed)
Heather Boone July 11, 1981 [id number]        42 y.o.  G1P0010 Video visit between patient in her home and myself in my [place] office.  Patient well identified.  Counseling on MRI results and management for 30 minutes.  RP: Counseling on MRI findings of a Rt Solid Adnexal Mass  HPI: LMP 08/31/22.  At last visit on 08/17/22 we noted:  Menses regular normal to heavy flow every month until May 2023.  No cycle in May and only spotting in June for a couple days which is not normal for her. Has done UPT at home which was negative after missed cycle in May.  No pelvic pain.  No pain with IC. Pap reflex Neg 06/19/22.  Pelvic US 08/17/22: T/V images.  Anteverted uterus normal in size and shape with an echogenic vascular mass seen from the right lateral aspect of the uterus measuring approximately 7.2 x 3.5 x 5.0 cm.  Difficult to determine mine if the origin is uterine or right ovarian.  The overall uterine size is measured at 7.18 x 4.54 x 3.43 cm.  The endometrial lining is secretory in appearance measuring approximately 7.18 mm with no obvious mass or thickening.  The left ovary is slightly enlarged with positive perfusion.  Simple avascular dominant follicle is seen on the left ovary.  The right ovary is not definitively seen.  Possible right ovary measured posterior to the mass however very difficult to clearly identify.  That area had positive perfusion and measured normal in size for an ovary. Trace free fluid in the left adnexa and posterior cul-de-sac.     OB History  Gravida Para Term Preterm AB Living  1 0     1 0  SAB IAB Ectopic Multiple Live Births    1          # Outcome Date GA Lbr Len/2nd Weight Sex Delivery Anes PTL Lv  1 IAB             Past medical history,surgical history, problem list, medications, allergies, family history and social history were all reviewed and documented in the EPIC chart.   Directed ROS with pertinent positives and negatives documented in the history of  present illness/assessment and plan.  Exam:  There were no vitals filed for this visit. General appearance:  Normal  Gynecologic exam: Deferred, re video visit  MRI of Pelvis 08/31/22: Reproductive:   -- Uterus: Measures 7.6 x 3.3 by 4.0 cm (volume = 53 cm^3). No definite uterine fibroids identified. Cervix and vagina are unremarkable.   -- Right ovary: No normal right ovary visualized. A solid mass which shows diffuse heterogeneous T2 hyperintensity and contrast enhancement is seen which abuts the right lateral uterine wall. This shows no evidence of internal fat, and does not show signal intensity of a fibroid. This measures 6.9 by 4.4 cm and is suspicious for a solid ovarian neoplasm.   -- Left ovary: Benign-appearing left ovarian cystic lesion is seen measuring 3.9 x 3.9 cm, which is most likely physiologic in a reproductive age female.   Other: No peritoneal thickening or abnormal free fluid.   Musculoskeletal:  Unremarkable.   IMPRESSION: 6.9 cm solid mass in the right adnexa has nonspecific characteristics, but is suspicious for a solid ovarian neoplasm. Surgical evaluation should be considered.   No evidence of pelvic metastatic disease.   3.9 cm benign-appearing left ovarian cystic lesion, most likely physiologic in a reproductive age female.   Assessment/Plan:  41 y.o.  G1P0010   1. Rt Solid Adnexal Mass  MRI findings thoroughly reviewed with patient.  6.9 cm solid mass in the right adnexa has nonspecific characteristics, but is suspicious for a solid ovarian neoplasm. Surgical evaluation should be considered.  Given the possible large solid tumor of the Rt Ovary of an uncertain nature, decision to refer to [facility] for further investigation and management.  Patient understands and agrees with the plan.  [name], 4:05 PM 09/05/2022

## 2022-09-06 ENCOUNTER — Telehealth: Payer: Self-pay | Admitting: *Deleted

## 2022-09-06 NOTE — Telephone Encounter (Signed)
Called patient left message to setup new patient appt with Dr Berline Lopes 10/3.

## 2022-09-06 NOTE — Telephone Encounter (Signed)
Spoke with the patient regarding the referral to GYN oncology. Patient scheduled as new patient with Dr Berline Lopes on 10/6 at 10:30am.  Patient given an arrival time of 10:00am.  Explained to the patient the the doctor will perform a pelvic exam at this visit. Patient given the policy that no visitors under the 16 yrs are allowed in the [place]. Patient given the address/phone number for the clinic and that the center offers free valet service.

## 2022-09-07 NOTE — Telephone Encounter (Signed)
Patient scheduled on 09/29/22 with Dr.Tucker

## 2022-09-12 ENCOUNTER — Encounter: Payer: Self-pay | Admitting: Obstetrics & Gynecology

## 2022-09-20 NOTE — Telephone Encounter (Signed)
Routing to Dr. Ileene Musa.   Encounter closed.

## 2022-09-25 NOTE — Telephone Encounter (Signed)
Appt with Dr. Berline Lopes scheduled 09/29/22 at 10:30am.

## 2022-09-28 ENCOUNTER — Encounter: Payer: Self-pay | Admitting: Gynecologic Oncology

## 2022-09-28 NOTE — Progress Notes (Signed)
GYNECOLOGIC ONCOLOGY NEW PATIENT CONSULTATION   Patient Name: Heather Boone  Patient Age: 41 y.o. Date of Service: 09/28/22 Referring Provider: Princess Bruins, MD  Primary Care Provider: Kelton Pillar, MD Consulting Provider: Jeral Pinch, MD   Assessment/Plan:  Premenopausal patient who strongly desires to maintain fertility presenting with a solid-appearing adnexal mass.  I discussed MRI findings which may represent solid adnexal mass versus pedunculated fibroid.  Unfortunately, imaging is not able to definitively differentiate between these two.  I am suspicious that this lesion is a solid ovarian tumor, replacing the right ovary.  For diagnostic and therapeutic purposes, although the patient is relatively asymptomatic, I am recommending minimally invasive surgery.  This would include unilateral salpingo-oophorectomy with plan to send the specimen for frozen section if it is a solid ovarian mass.  If, on laparoscopic assessment, the mass appears to be a subserosal or pedunculated fibroid the ovaries were normal in appearance, then no additional procedures would need to be performed.  The patient strongly desires to maintain her fertility.  We discussed that if on frozen section, the ovarian mass appears to be a borderline tumor, plan would be to leave the other tube and ovary in situ as well as the uterus and cervix and perform staging procedures including peritoneal biopsies and omentectomy.  In the setting of a malignancy, plan would be for fertility-sparing staging.  We reviewed the plan for a diagnostic laparoscopy, possible robotic unilateral salpingo-oophorectomy, possible fertility sparing staging including lymph node dissection, possible laparotomy. The risks of surgery were discussed in detail and she understands these to include infection; wound separation; hernia; vaginal cuff separation, injury to adjacent organs such as bowel, bladder, blood vessels, ureters and nerves;  bleeding which may require blood transfusion; anesthesia risk; thromboembolic events; possible death; unforeseen complications; possible need for re-exploration; medical complications such as heart attack, stroke, pleural effusion and pneumonia; and, if full lymphadenectomy is performed the risk of lymphedema and lymphocyst. The patient will receive DVT and antibiotic prophylaxis as indicated. She voiced a clear understanding. She had the opportunity to ask questions. Perioperative instructions were reviewed with her. Prescriptions for post-op medications were sent to her pharmacy of choice.  I reached out to Dr. Binnie Rail office.  The patient has not been seen there in approximately a year and a half.  She had reached out this summer about reestablishing care but this has not happened yet.  I have encouraged the patient to call the office to schedule either an in person or virtual visit.  Ideally, this would be done before we pursue surgery.  If there is a cyst on the left ovary at the time of surgery that Dr. Rolin Barry would like me to remove, I be happy to do this.  I also would be happy to perform any intrauterine evaluation (such as hysteroscopy) that she may need before pursuing IVF.  A copy of this note was sent to the patient's referring provider.   65 minutes of total time was spent for this patient encounter, including preparation, face-to-face counseling with the patient and coordination of care, and documentation of the encounter.   Jeral Pinch, MD  Division of Gynecologic Oncology  Department of Obstetrics and Gynecology  [facility]  ___________________________________________  Chief Complaint: Chief Complaint  Patient presents with   Adnexal mass    History of Present Illness:  Heather Boone is a 41 y.o. y.o. female who is seen in consultation at the request of Dr. Dellis Filbert for an  evaluation of a solid-appearing adnexal mass.  Patient was initially  seen for an annual exam in June of this year.  This was shortly after she skipped a menstrual cycle in June.  She normally has regular menses that last for approximately 6 days.  Given this irregular menses, patient had some blood work done (TSH and prolactin) and a pelvic ultrasound was ordered.  Pelvic ultrasound exam on 08/17/2022: Anteverted uterus.  Endometrial lining secretory in appearance, measuring 7.2 mm without any obvious mass or thickening.  Echogenic vascular mass seen at the right lateral aspect of the uterus measuring 7.2 x 3.5 x 5 cm.  Difficult to determine uterine versus ovarian origin.  Left ovary is slightly enlarged with positive perfusion.  Simple avascular dominant follicle seen in the left ovary.  Right ovary is not definitively seen.  08/31/2022: Pelvic MRI shows uterus measures 7.6 x 3.3 x 4 cm.  No definitive fibroids noted.  Cervix and vagina are unremarkable.  Within the right ovary, no normal right ovarian tissue seen.  A solid mass with diffuse heterogenous T2 hyperintensity and contrast-enhancement is seen abutting the right lateral uterine wall.  This shows no evidence of internal fat or signal intensity of the fibroid.  It measures 6.9 x 4.4 cm and is suspicious for a solid ovarian neoplasm.  Left ovary measures approximately 4 cm with a benign appearing ovarian cyst, most likely physiologic.  No ascites or adenopathy.  Patient reports today overall feeling well.  She notes some mild pressure or cramping sensation with her menses which started about a year ago.  She describes this as being low in her menses.  It has not worsened over the last year.  It is intermittent in nature.  Patient denies any intermenstrual bleeding.  She reports normal bowel and bladder function.  Some recent weight gain (approximately 30 pounds).   Patient is currently in school.  Her father lives with her.  Patient has been treated for infertility.  She is tried Clomid to conceive previously. She  has seen Dr. Rolin Barry at [facility].  Is interested in pursuing IVF.  Her surgical history is notable for a lap band in 2011.  PAST MEDICAL HISTORY:  Past Medical History:  Diagnosis Date   B12 deficiency    Back pain    Constipation    Fatigue    Heartburn    Obesity    Prediabetes    SOB (shortness of breath) on exertion    resolved (was associated with being sick)   Vitamin D deficiency      PAST SURGICAL HISTORY:  Past Surgical History:  Procedure Laterality Date   LAPAROSCOPIC GASTRIC BANDING     dec 2011, [facility]    OB/GYN HISTORY:  OB History  Gravida Para Term Preterm AB Living  1 0     1 0  SAB IAB Ectopic Multiple Live Births    1          # Outcome Date GA Lbr Len/2nd Weight Sex Delivery Anes PTL Lv  1 IAB             No LMP recorded.  Last pap: 05/2022- [facility] History of abnormal pap smears: denies  MEDICATIONS: Outpatient Encounter Medications as of 09/29/2022  Medication Sig   acetaminophen (TYLENOL) 325 MG tablet Take 2 tablets (650 mg total) by mouth every 6 (six) hours as needed.   buPROPion (WELLBUTRIN SR) 200 MG 12 hr tablet Take 1 tablet (200 mg total) by  mouth 2 (two) times daily.   Cholecalciferol (VITAMIN D3) 1.25 MG (50000 UT) CAPS Take 1 capsule by mouth once a week.   oxyCODONE (OXY IR/ROXICODONE) 5 MG immediate release tablet Take 1 tablet (5 mg total) by mouth every 4 (four) hours as needed for severe pain. For AFTER surgery only, do not take and drive   Semaglutide-Weight Management 1.7 MG/0.75ML SOAJ Inject 1.7 mg into the skin once a week.   senna-docusate (SENOKOT-S) 8.6-50 MG tablet Take 2 tablets by mouth at bedtime. For AFTER surgery, do not take if having diarrhea   Vitamin D, Ergocalciferol, (DRISDOL) 1.25 MG (50000 UNIT) CAPS capsule Take 50,000 Units by mouth once a week.   [DISCONTINUED] albuterol (VENTOLIN HFA) 108 (90 Base) MCG/ACT inhaler SMARTSIG:1-2 Puff(s) Via Inhaler Every 4-6 Hours PRN (Patient not taking:  Reported on 09/05/2022)   [DISCONTINUED] Prenatal Vit-Fe Fumarate-FA (PNV PRENATAL PLUS MULTIVITAMIN) 27-1 MG TABS Take 1 tablet by mouth daily. (Patient not taking: Reported on 09/05/2022)   No facility-administered encounter medications on file as of 09/29/2022.    ALLERGIES:  Allergies  Allergen Reactions   Toradol [Ketorolac Tromethamine]     Other reaction(s): Unknown   Aleve [Naproxen Sodium] Rash   Motrin [Ibuprofen] Rash   Tramadol Rash     FAMILY HISTORY:  Family History  Problem Relation Age of Onset   Stroke Mother    Hypertension Mother    Hyperlipidemia Mother    Anxiety disorder Mother    Lung cancer Mother    Hypertension Father    Hyperlipidemia Father    Heart disease Father    Obesity Father    Colon cancer Father      SOCIAL HISTORY:  Social Connections: Not on file    REVIEW OF SYSTEMS:  + appetite changes, pelvic pain Denies fevers, chills, fatigue, unexplained weight changes. Denies hearing loss, neck lumps or masses, mouth sores, ringing in ears or voice changes. Denies cough or wheezing.  Denies shortness of breath. Denies chest pain or palpitations. Denies leg swelling. Denies abdominal distention, pain, blood in stools, constipation, diarrhea, nausea, vomiting, or early satiety. Denies pain with intercourse, dysuria, frequency, hematuria or incontinence. Denies hot flashes, vaginal bleeding or vaginal discharge.   Denies joint pain, back pain or muscle pain/cramps. Denies itching, rash, or wounds. Denies dizziness, headaches, numbness or seizures. Denies swollen lymph nodes or glands, denies easy bruising or bleeding. Denies anxiety, depression, confusion, or decreased concentration.  Physical Exam:  Vital Signs for this encounter:  Blood pressure 138/87, pulse 68, temperature 98 F (36.7 C), temperature source Oral, resp. rate 18, height 5' 4.17" (1.63 m), weight 255 lb (115.7 kg), SpO2 98 %. Body mass index is 43.53 kg/m. General:  Alert, oriented, no acute distress.  HEENT: Normocephalic, atraumatic. Sclera anicteric.  Chest: Clear to auscultation bilaterally. No wheezes, rhonchi, or rales. Cardiovascular: Regular rate and rhythm, no murmurs, rubs, or gallops.  Abdomen: Obese. Normoactive bowel sounds. Soft, nondistended, nontender to palpation. No masses or hepatosplenomegaly appreciated. No palpable fluid wave.  Well-healed laparoscopic incisions in the upper abdomen. Extremities: Grossly normal range of motion. Warm, well perfused. No edema bilaterally.  Skin: No rashes or lesions.  Lymphatics: No cervical, supraclavicular, or inguinal adenopathy.  GU:  Normal external female genitalia.  No lesions. No discharge or bleeding.             Bladder/urethra:  No lesions or masses, well supported bladder             Vagina: Well  rugated, no lesions or masses noted.             Cervix: Normal appearing, no lesions.             Uterus: Small, mobile, no parametrial involvement or nodularity.             Adnexa: Fullness appreciated in the cul-de-sac, smooth and mobile.  Along the right aspect of the uterus and posterior to it, there is a firm mass, also moves in conjunction with the uterus.  Occult to distinguish whether this is adjacent to or attached to the uterus.    Rectal: Deferred.  LABORATORY AND RADIOLOGIC DATA:  Outside medical records were reviewed to synthesize the above history, along with the history and physical obtained during the visit.   Lab Results  Component Value Date   WBC 7.5 01/03/2022   HGB 13.1 01/03/2022   HCT 40.5 01/03/2022   PLT 373 01/03/2022   GLUCOSE 84 01/03/2022   CHOL 161 01/03/2022   TRIG 82 01/03/2022   HDL 64 01/03/2022   LDLCALC 82 01/03/2022   ALT 13 01/03/2022   AST 13 01/03/2022   NA 139 01/03/2022   K 4.7 01/03/2022   CL 103 01/03/2022   CREATININE 0.62 01/03/2022   BUN 8 01/03/2022   CO2 20 01/03/2022   TSH 1.82 06/19/2022   HGBA1C 5.7 (H) 01/03/2022

## 2022-09-29 ENCOUNTER — Inpatient Hospital Stay: Payer: No Typology Code available for payment source | Attending: Gynecologic Oncology | Admitting: Gynecologic Oncology

## 2022-09-29 ENCOUNTER — Inpatient Hospital Stay (HOSPITAL_BASED_OUTPATIENT_CLINIC_OR_DEPARTMENT_OTHER): Payer: No Typology Code available for payment source | Admitting: Gynecologic Oncology

## 2022-09-29 ENCOUNTER — Encounter: Payer: Self-pay | Admitting: Gynecologic Oncology

## 2022-09-29 ENCOUNTER — Other Ambulatory Visit: Payer: Self-pay

## 2022-09-29 ENCOUNTER — Other Ambulatory Visit: Payer: Self-pay | Admitting: Gynecologic Oncology

## 2022-09-29 VITALS — BP 138/87 | HR 68 | Temp 98.0°F | Resp 18 | Ht 64.17 in | Wt 255.0 lb

## 2022-09-29 DIAGNOSIS — Z6841 Body Mass Index (BMI) 40.0 and over, adult: Secondary | ICD-10-CM | POA: Insufficient documentation

## 2022-09-29 DIAGNOSIS — E538 Deficiency of other specified B group vitamins: Secondary | ICD-10-CM | POA: Diagnosis not present

## 2022-09-29 DIAGNOSIS — Z9884 Bariatric surgery status: Secondary | ICD-10-CM | POA: Diagnosis not present

## 2022-09-29 DIAGNOSIS — Z79899 Other long term (current) drug therapy: Secondary | ICD-10-CM | POA: Diagnosis not present

## 2022-09-29 DIAGNOSIS — R1900 Intra-abdominal and pelvic swelling, mass and lump, unspecified site: Secondary | ICD-10-CM | POA: Diagnosis present

## 2022-09-29 DIAGNOSIS — N9489 Other specified conditions associated with female genital organs and menstrual cycle: Secondary | ICD-10-CM

## 2022-09-29 DIAGNOSIS — K5900 Constipation, unspecified: Secondary | ICD-10-CM | POA: Insufficient documentation

## 2022-09-29 DIAGNOSIS — N979 Female infertility, unspecified: Secondary | ICD-10-CM | POA: Insufficient documentation

## 2022-09-29 MED ORDER — SENNOSIDES-DOCUSATE SODIUM 8.6-50 MG PO TABS: 2.0000 | ORAL_TABLET | Freq: Every day | ORAL | 0 refills | Status: DC

## 2022-09-29 MED ORDER — OXYCODONE HCL 5 MG PO TABS: 5.0000 mg | ORAL_TABLET | ORAL | 0 refills | Status: DC | PRN

## 2022-09-29 NOTE — Patient Instructions (Signed)
Dr. Berline Lopes is recommending you reach out to Dr. Binnie Rail office to arrange for an appt before surgery.  Preparing for your Surgery  Plan for surgery on November 02, 2022 with Dr. Jeral Pinch at [place] will be scheduled for diagnostic laparoscopy (making a small incision on your abdomen and looking with a camera), possible unilateral salpingo-oophorectomy (removal of one ovary and fallopian tube), possible fertility sparing staging if a cancer is identified.   Pre-operative Testing -You will receive a phone call from presurgical testing at [facility] to arrange for a pre-operative appointment and lab work.  -Bring your insurance card, copy of an advanced directive if applicable, medication list  -At that visit, you will be asked to sign a consent for a possible blood transfusion in case a transfusion becomes necessary during surgery.  The need for a blood transfusion is rare but having consent is a necessary part of your care.     -You should not be taking blood thinners or aspirin at least ten days prior to surgery unless instructed by your surgeon.  -Do not take supplements such as fish oil (omega 3), red yeast rice, turmeric before your surgery. You want to avoid medications with aspirin in them including headache powders such as BC or Goody's), Excedrin migraine.  Day Before Surgery at [place] will be asked to take in a light diet the day before surgery. You will be advised you can have clear liquids up until 3 hours before your surgery.    Eat a light diet the day before surgery.  Examples including soups, broths, toast, yogurt, mashed potatoes.  AVOID GAS PRODUCING FOODS. Things to avoid include carbonated beverages (fizzy beverages, sodas), raw fruits and raw vegetables (uncooked), or beans.   If your bowels are filled with gas, your surgeon will have difficulty visualizing your pelvic organs which increases your surgical risks.  Your role in  recovery Your role is to become active as soon as directed by your doctor, while still giving yourself time to heal.  Rest when you feel tired. You will be asked to do the following in order to speed your recovery:  - Cough and breathe deeply. This helps to clear and expand your lungs and can prevent pneumonia after surgery.  - Thomas. Do mild physical activity. Walking or moving your legs help your circulation and body functions return to normal. Do not try to get up or walk alone the first time after surgery.   -If you develop swelling on one leg or the other, pain in the back of your leg, redness/warmth in one of your legs, please call the office or go to the Emergency Room to have a doppler to rule out a blood clot. For shortness of breath, chest pain-seek care in the Emergency Room as soon as possible. - Actively manage your pain. Managing your pain lets you move in comfort. We will ask you to rate your pain on a scale of zero to 10. It is your responsibility to tell your doctor or nurse where and how much you hurt so your pain can be treated.  Special Considerations -If you are diabetic, you may be placed on insulin after surgery to have closer control over your blood sugars to promote healing and recovery.  This does not mean that you will be discharged on insulin.  If applicable, your oral antidiabetics will be resumed when you are tolerating a solid diet.  -Your final pathology  results from surgery should be available around one week after surgery and the results will be relayed to you when available.  -FMLA forms can be faxed to [phone] and please allow 5-7 business days for completion.  Pain Management After Surgery -You have been prescribed your pain medication (oxycodone) and bowel regimen medications before surgery so that you can have these available when you are discharged from the hospital. The pain medication is for use ONLY AFTER surgery and a new  prescription will not be given.   -Make sure that you have Tylenol IF YOU ARE ABLE TO TAKE THESE MEDICATION at home to use on a regular basis after surgery for pain control.   -Review the attached handout on narcotic use and their risks and side effects.   Bowel Regimen -You have been prescribed Sennakot-S to take nightly to prevent constipation especially if you are taking the narcotic pain medication intermittently.  It is important to prevent constipation and drink adequate amounts of liquids. You can stop taking this medication when you are not taking pain medication and you are back on your normal bowel routine.  Risks of Surgery Risks of surgery are low but include bleeding, infection, damage to surrounding structures, re-operation, blood clots, and very rarely death.   Blood Transfusion Information (For the consent to be signed before surgery)  We will be checking your blood type before surgery so in case of emergencies, we will know what type of blood you would need.                                            WHAT IS A BLOOD TRANSFUSION?  A transfusion is the replacement of blood or some of its parts. Blood is made up of multiple cells which provide different functions. Red blood cells carry oxygen and are used for blood loss replacement. White blood cells fight against infection. Platelets control bleeding. Plasma helps clot blood. Other blood products are available for specialized needs, such as hemophilia or other clotting disorders. BEFORE THE TRANSFUSION  Who gives blood for transfusions?  You may be able to donate blood to be used at a later date on yourself (autologous donation). Relatives can be asked to donate blood. This is generally not any safer than if you have received blood from a stranger. The same precautions are taken to ensure safety when a relative's blood is donated. Healthy volunteers who are fully evaluated to make sure their blood is safe. This is blood  bank blood. Transfusion therapy is the safest it has ever been in the practice of medicine. Before blood is taken from a donor, a complete history is taken to make sure that person has no history of diseases nor engages in risky social behavior (examples are intravenous drug use or sexual activity with multiple partners). The donor's travel history is screened to minimize risk of transmitting infections, such as malaria. The donated blood is tested for signs of infectious diseases, such as HIV and hepatitis. The blood is then tested to be sure it is compatible with you in order to minimize the chance of a transfusion reaction. If you or a relative donates blood, this is often done in anticipation of surgery and is not appropriate for emergency situations. It takes many days to process the donated blood. RISKS AND COMPLICATIONS Although transfusion therapy is very safe and saves many lives, the  main dangers of transfusion include:  Getting an infectious disease. Developing a transfusion reaction. This is an allergic reaction to something in the blood you were given. Every precaution is taken to prevent this. The decision to have a blood transfusion has been considered carefully by your caregiver before blood is given. Blood is not given unless the benefits outweigh the risks.  AFTER SURGERY INSTRUCTIONS  Return to work: 4-6 weeks if applicable  Activity: 1. Be up and out of the bed during the day.  Take a nap if needed.  You may walk up steps but be careful and use the hand rail.  Stair climbing will tire you more than you think, you may need to stop part way and rest.   2. No lifting or straining for 6 weeks over 10 pounds. No pushing, pulling, straining for 6 weeks.  3. No driving for around 1 week(s).  Do not drive if you are taking narcotic pain medicine and make sure that your reaction time has returned.   4. You can shower as soon as the next day after surgery. Shower daily.  Use your regular  soap and water (not directly on the incision) and pat your incision(s) dry afterwards; don't rub.  No tub baths or submerging your body in water until cleared by your surgeon. If you have the soap that was given to you by pre-surgical testing that was used before surgery, you do not need to use it afterwards because this can irritate your incisions.   5. No sexual activity and nothing in the vagina for 4 weeks.  6. You may experience a small amount of clear drainage from your incisions, which is normal.  If the drainage persists, increases, or changes color please call the office.  7. Do not use creams, lotions, or ointments such as neosporin on your incisions after surgery until advised by your surgeon because they can cause removal of the dermabond glue on your incisions.    8. You may experience vaginal spotting after surgery or around the 6-8 week mark from surgery when the stitches at the top of the vagina begin to dissolve.  The spotting is normal but if you experience heavy bleeding, call our office.  9. Take Tylenol first for pain if you are able to take these medications and only use narcotic pain medication for severe pain not relieved by the Tylenol.  Monitor your Tylenol intake to a max of 4,000 mg in a 24 hour period.   Diet: 1. Low sodium Heart Healthy Diet is recommended but you are cleared to resume your normal (before surgery) diet after your procedure.  2. It is safe to use a laxative, such as Miralax or Colace, if you have difficulty moving your bowels. You have been prescribed Sennakot-S to take at bedtime every evening after surgery to keep bowel movements regular and to prevent constipation.    Wound Care: 1. Keep clean and dry.  Shower daily.  Reasons to call the Doctor: Fever - Oral temperature greater than 100.4 degrees Fahrenheit Foul-smelling vaginal discharge Difficulty urinating Nausea and vomiting Increased pain at the site of the incision that is unrelieved  with pain medicine. Difficulty breathing with or without chest pain New calf pain especially if only on one side Sudden, continuing increased vaginal bleeding with or without clots.   Contacts: For questions or concerns you should contact:  Dr. Jeral Pinch at [phone]  Joylene John, NP at [phone]  After Hours: call [phone] and have the  GYN Oncologist paged/contacted (after 5 pm or on the weekends).  Messages sent via mychart are for non-urgent matters and are not responded to after hours so for urgent needs, please call the after hours number.

## 2022-09-29 NOTE — Progress Notes (Signed)
Patient here for new patient consultation with Dr. Jeral Pinch and for a pre-operative appointment prior to her scheduled surgery on November 02, 2022. She is scheduled for diagnostic laparoscopy, possible unilateral salpingo-oophorectomy, possible fertility sparing staging if a cancer is identified. The surgery was discussed in detail.  See after visit summary for additional details. Visual aids used to discuss items related to surgery including sequential compression stockings, foley catheter, IV pump, multi-modal pain regimen including tylenol, photo of the surgical robot, female reproductive system to discuss surgery in detail.      Discussed post-op pain management in detail including the aspects of the enhanced recovery pathway.  Advised her that a new prescription would be sent in for oxycodone and it is only to be used for after her upcoming surgery.  We discussed the use of tylenol post-op and to monitor for a maximum of 4,000 mg in a 24 hour period.  Also prescribed sennakot to be used after surgery and to hold if having loose stools.  Discussed bowel regimen in detail.     Discussed the use of SCDs and measures to take at home to prevent DVT including frequent mobility.  Reportable signs and symptoms of DVT discussed. Post-operative instructions discussed and expectations for after surgery. Incisional care discussed as well including reportable signs and symptoms including erythema, drainage, wound separation.     5 minutes spent with the patient.  Verbalizing understanding of material discussed. No needs or concerns voiced at the end of the visit. Advised patient and family to call for any needs.  Advised that her post-operative medications had been prescribed and could be picked up at any time.    This appointment is included in the global surgical bundle as pre-operative teaching and has no charge.

## 2022-09-29 NOTE — Patient Instructions (Signed)
Dr. Berline Lopes is recommending you reach out to Dr. Binnie Rail office to arrange for an appt before surgery.   Preparing for your Surgery   Plan for surgery on November 02, 2022 with Dr. Jeral Pinch at [place] will be scheduled for diagnostic laparoscopy (making a small incision on your abdomen and looking with a camera), possible unilateral salpingo-oophorectomy (removal of one ovary and fallopian tube), possible fertility sparing staging if a cancer is identified.    Pre-operative Testing -You will receive a phone call from presurgical testing at [facility] to arrange for a pre-operative appointment and lab work.   -Bring your insurance card, copy of an advanced directive if applicable, medication list   -At that visit, you will be asked to sign a consent for a possible blood transfusion in case a transfusion becomes necessary during surgery.  The need for a blood transfusion is rare but having consent is a necessary part of your care.      -You should not be taking blood thinners or aspirin at least ten days prior to surgery unless instructed by your surgeon.   -Do not take supplements such as fish oil (omega 3), red yeast rice, turmeric before your surgery. You want to avoid medications with aspirin in them including headache powders such as BC or Goody's), Excedrin migraine.   Day Before Surgery at [place] will be asked to take in a light diet the day before surgery. You will be advised you can have clear liquids up until 3 hours before your surgery.     Eat a light diet the day before surgery.  Examples including soups, broths, toast, yogurt, mashed potatoes.  AVOID GAS PRODUCING FOODS. Things to avoid include carbonated beverages (fizzy beverages, sodas), raw fruits and raw vegetables (uncooked), or beans.    If your bowels are filled with gas, your surgeon will have difficulty visualizing your pelvic organs which increases your surgical risks.   Your role  in recovery Your role is to become active as soon as directed by your doctor, while still giving yourself time to heal.  Rest when you feel tired. You will be asked to do the following in order to speed your recovery:   - Cough and breathe deeply. This helps to clear and expand your lungs and can prevent pneumonia after surgery.  - [place]. Do mild physical activity. Walking or moving your legs help your circulation and body functions return to normal. Do not try to get up or walk alone the first time after surgery.   -If you develop swelling on one leg or the other, pain in the back of your leg, redness/warmth in one of your legs, please call the office or go to the Emergency Room to have a doppler to rule out a blood clot. For shortness of breath, chest pain-seek care in the Emergency Room as soon as possible. - Actively manage your pain. Managing your pain lets you move in comfort. We will ask you to rate your pain on a scale of zero to 10. It is your responsibility to tell your doctor or nurse where and how much you hurt so your pain can be treated.   Special Considerations -If you are diabetic, you may be placed on insulin after surgery to have closer control over your blood sugars to promote healing and recovery.  This does not mean that you will be discharged on insulin.  If applicable, your oral antidiabetics will  be resumed when you are tolerating a solid diet.   -Your final pathology results from surgery should be available around one week after surgery and the results will be relayed to you when available.   -FMLA forms can be faxed to [phone] and please allow 5-7 business days for completion.   Pain Management After Surgery -You have been prescribed your pain medication (oxycodone) and bowel regimen medications before surgery so that you can have these available when you are discharged from the hospital. The pain medication is for use ONLY AFTER surgery and a  new prescription will not be given.    -Make sure that you have Tylenol IF YOU ARE ABLE TO TAKE THESE MEDICATION at home to use on a regular basis after surgery for pain control.    -Review the attached handout on narcotic use and their risks and side effects.    Bowel Regimen -You have been prescribed Sennakot-S to take nightly to prevent constipation especially if you are taking the narcotic pain medication intermittently.  It is important to prevent constipation and drink adequate amounts of liquids. You can stop taking this medication when you are not taking pain medication and you are back on your normal bowel routine.   Risks of Surgery Risks of surgery are low but include bleeding, infection, damage to surrounding structures, re-operation, blood clots, and very rarely death.     Blood Transfusion Information (For the consent to be signed before surgery)   We will be checking your blood type before surgery so in case of emergencies, we will know what type of blood you would need.                                             WHAT IS A BLOOD TRANSFUSION?   A transfusion is the replacement of blood or some of its parts. Blood is made up of multiple cells which provide different functions. Red blood cells carry oxygen and are used for blood loss replacement. White blood cells fight against infection. Platelets control bleeding. Plasma helps clot blood. Other blood products are available for specialized needs, such as hemophilia or other clotting disorders. BEFORE THE TRANSFUSION  Who gives blood for transfusions?  You may be able to donate blood to be used at a later date on yourself (autologous donation). Relatives can be asked to donate blood. This is generally not any safer than if you have received blood from a stranger. The same precautions are taken to ensure safety when a relative's blood is donated. Healthy volunteers who are fully evaluated to make sure their blood is safe.  This is blood bank blood. Transfusion therapy is the safest it has ever been in the practice of medicine. Before blood is taken from a donor, a complete history is taken to make sure that person has no history of diseases nor engages in risky social behavior (examples are intravenous drug use or sexual activity with multiple partners). The donor's travel history is screened to minimize risk of transmitting infections, such as malaria. The donated blood is tested for signs of infectious diseases, such as HIV and hepatitis. The blood is then tested to be sure it is compatible with you in order to minimize the chance of a transfusion reaction. If you or a relative donates blood, this is often done in anticipation of surgery and is not appropriate for  emergency situations. It takes many days to process the donated blood. RISKS AND COMPLICATIONS Although transfusion therapy is very safe and saves many lives, the main dangers of transfusion include:  Getting an infectious disease. Developing a transfusion reaction. This is an allergic reaction to something in the blood you were given. Every precaution is taken to prevent this. The decision to have a blood transfusion has been considered carefully by your caregiver before blood is given. Blood is not given unless the benefits outweigh the risks.   AFTER SURGERY INSTRUCTIONS   Return to work: 4-6 weeks if applicable   Activity: 1. Be up and out of the bed during the day.  Take a nap if needed.  You may walk up steps but be careful and use the hand rail.  Stair climbing will tire you more than you think, you may need to stop part way and rest.    2. No lifting or straining for 6 weeks over 10 pounds. No pushing, pulling, straining for 6 weeks.   3. No driving for around 1 week(s).  Do not drive if you are taking narcotic pain medicine and make sure that your reaction time has returned.    4. You can shower as soon as the next day after surgery. Shower  daily.  Use your regular soap and water (not directly on the incision) and pat your incision(s) dry afterwards; don't rub.  No tub baths or submerging your body in water until cleared by your surgeon. If you have the soap that was given to you by pre-surgical testing that was used before surgery, you do not need to use it afterwards because this can irritate your incisions.    5. No sexual activity and nothing in the vagina for 4 weeks.   6. You may experience a small amount of clear drainage from your incisions, which is normal.  If the drainage persists, increases, or changes color please call the office.   7. Do not use creams, lotions, or ointments such as neosporin on your incisions after surgery until advised by your surgeon because they can cause removal of the dermabond glue on your incisions.     8. You may experience vaginal spotting after surgery or around the 6-8 week mark from surgery when the stitches at the top of the vagina begin to dissolve.  The spotting is normal but if you experience heavy bleeding, call our office.   9. Take Tylenol first for pain if you are able to take these medications and only use narcotic pain medication for severe pain not relieved by the Tylenol.  Monitor your Tylenol intake to a max of 4,000 mg in a 24 hour period.    Diet: 1. Low sodium Heart Healthy Diet is recommended but you are cleared to resume your normal (before surgery) diet after your procedure.   2. It is safe to use a laxative, such as Miralax or Colace, if you have difficulty moving your bowels. You have been prescribed Sennakot-S to take at bedtime every evening after surgery to keep bowel movements regular and to prevent constipation.     Wound Care: 1. Keep clean and dry.  Shower daily.   Reasons to call the Doctor: Fever - Oral temperature greater than 100.4 degrees Fahrenheit Foul-smelling vaginal discharge Difficulty urinating Nausea and vomiting Increased pain at the site of  the incision that is unrelieved with pain medicine. Difficulty breathing with or without chest pain New calf pain especially if only on one  side Sudden, continuing increased vaginal bleeding with or without clots.   Contacts: For questions or concerns you should contact:   Dr. Jeral Pinch at [phone]   Joylene John, NP at [phone]   After Hours: call [phone] and have the GYN Oncologist paged/contacted (after 5 pm or on the weekends).   Messages sent via mychart are for non-urgent matters and are not responded to after hours so for urgent needs, please call the after hours number.

## 2022-10-04 ENCOUNTER — Telehealth: Payer: Self-pay | Admitting: *Deleted

## 2022-10-04 NOTE — Telephone Encounter (Signed)
Patient called and stated "I wanted to see about moving my surgery from 11/9 to 11/30? Dr Berline Lopes wanted me to see the fertility doctor first; and that appt is on 11/16."  Explained that the message would be given to [facility] APP and the office will call her back.

## 2022-10-04 NOTE — Telephone Encounter (Signed)
Per Joylene John NP, Pt aware surgery can be moved to 11/30. She is aware I will call her with new post-op visit dates and pre-admit will also call her with a new date as well. Pt voiced an understanding and is in agreement with surgery date.

## 2022-10-11 ENCOUNTER — Other Ambulatory Visit (INDEPENDENT_AMBULATORY_CARE_PROVIDER_SITE_OTHER): Payer: Self-pay | Admitting: Family Medicine

## 2022-10-26 ENCOUNTER — Encounter (HOSPITAL_COMMUNITY): Payer: No Typology Code available for payment source

## 2022-11-02 DIAGNOSIS — N9489 Other specified conditions associated with female genital organs and menstrual cycle: Secondary | ICD-10-CM

## 2022-11-09 ENCOUNTER — Telehealth: Payer: No Typology Code available for payment source | Admitting: Gynecologic Oncology

## 2022-11-09 ENCOUNTER — Encounter (INDEPENDENT_AMBULATORY_CARE_PROVIDER_SITE_OTHER): Payer: Self-pay | Admitting: Family Medicine

## 2022-11-09 ENCOUNTER — Ambulatory Visit (INDEPENDENT_AMBULATORY_CARE_PROVIDER_SITE_OTHER): Payer: No Typology Code available for payment source | Admitting: Family Medicine

## 2022-11-09 VITALS — BP 115/65 | HR 72 | Temp 98.7°F | Ht 63.0 in | Wt 253.0 lb

## 2022-11-09 DIAGNOSIS — F3289 Other specified depressive episodes: Secondary | ICD-10-CM

## 2022-11-09 DIAGNOSIS — Z6841 Body Mass Index (BMI) 40.0 and over, adult: Secondary | ICD-10-CM

## 2022-11-09 DIAGNOSIS — E559 Vitamin D deficiency, unspecified: Secondary | ICD-10-CM | POA: Diagnosis not present

## 2022-11-09 DIAGNOSIS — R7303 Prediabetes: Secondary | ICD-10-CM

## 2022-11-09 DIAGNOSIS — E669 Obesity, unspecified: Secondary | ICD-10-CM

## 2022-11-09 MED ORDER — VITAMIN D3 1.25 MG (50000 UT) PO CAPS: 1.0000 | ORAL_CAPSULE | ORAL | 0 refills | Status: DC

## 2022-11-09 MED ORDER — BUPROPION HCL ER (SR) 200 MG PO TB12: 200.0000 mg | ORAL_TABLET | Freq: Two times a day (BID) | ORAL | 0 refills | Status: DC

## 2022-11-09 MED ORDER — SEMAGLUTIDE-WEIGHT MANAGEMENT 1.7 MG/0.75ML ~~LOC~~ SOAJ: 1.7000 mg | SUBCUTANEOUS | 0 refills | Status: DC

## 2022-11-09 NOTE — Patient Instructions (Addendum)
DUE TO COVID-19 ONLY TWO VISITORS  (aged 39 and older)  ARE ALLOWED TO COME WITH YOU AND STAY IN THE WAITING ROOM ONLY DURING PRE OP AND PROCEDURE.   **NO VISITORS ARE ALLOWED IN THE SHORT STAY AREA OR RECOVERY ROOM!!**  IF YOU WILL BE ADMITTED INTO THE HOSPITAL YOU ARE ALLOWED ONLY FOUR SUPPORT PEOPLE DURING VISITATION HOURS ONLY (7 AM -8PM)   The support person(s) must pass our screening, gel in and out, and wear a mask at all times, including in the patient's room. Patients must also wear a mask when staff or their support person are in the room. Visitors GUEST BADGE MUST BE WORN VISIBLY  One adult visitor may remain with you overnight and MUST be in the room by 8 P.M.     Your procedure is scheduled on: 11/23/22   Report to [facility] Main Entrance    Report to admitting at : 5:15 AM   Call this number if you have problems the morning of surgery [phone]   Eat a light diet the day before surgery.  Examples including soups, broths, toast, yogurt, mashed potatoes.  Things to avoid include carbonated beverages (fizzy beverages), raw fruits and raw vegetables, or beans.   If your bowels are filled with gas, your surgeon will have difficulty visualizing your pelvic organs which increases your surgical risks.  Do not eat food :After Midnight.   After Midnight you may have the following liquids until : 4:30 AM DAY OF SURGERY  Water Black Coffee (sugar ok, NO MILK/CREAM OR CREAMERS)  Tea (sugar ok, NO MILK/CREAM OR CREAMERS) regular and decaf                             Plain Jell-O (NO RED)                                           Fruit ices (not with fruit pulp, NO RED)                                     Popsicles (NO RED)                                                                  Juice: apple, WHITE grape, WHITE cranberry Sports drinks like Gatorade (NO RED)              Oral Hygiene is also important to reduce your risk of infection.                                     Remember - BRUSH YOUR TEETH THE MORNING OF SURGERY WITH YOUR REGULAR TOOTHPASTE   Do NOT smoke after Midnight   Take these medicines the morning of surgery with A SIP OF WATER: BUPROPION.TYLENOL AS NEEDED.  HOLD SEMAGLUTIDE 7 DAYS BEFORE SURGERY.  You may not have any metal on your body including hair pins, jewelry, and body piercing             Do not wear make-up, lotions, powders, perfumes/cologne, or deodorant  Do not wear nail polish including gel and S&S, artificial/acrylic nails, or any other type of covering on natural nails including finger and toenails. If you have artificial nails, gel coating, etc. that needs to be removed by a nail salon please have this removed prior to surgery or surgery may need to be canceled/ delayed if the surgeon/ anesthesia feels like they are unable to be safely monitored.   Do not shave  48 hours prior to surgery.    Do not bring valuables to the hospital. [place].   Contacts, dentures or bridgework may not be worn into surgery.   Bring small overnight bag day of surgery.   DO NOT [place]. PHARMACY WILL DISPENSE MEDICATIONS LISTED ON YOUR MEDICATION LIST TO YOU DURING YOUR ADMISSION [place]!    Patients discharged on the day of surgery will not be allowed to drive home.  Someone NEEDS to stay with you for the first 24 hours after anesthesia.   Special Instructions: Bring a copy of your healthcare power of attorney and living will documents         the day of surgery if you haven't scanned them before.              Please read over the following fact sheets you were given: IF YOU HAVE QUESTIONS ABOUT YOUR PRE-OP INSTRUCTIONS PLEASE CALL [phone]    [facility] - Preparing for Surgery Before surgery, you can play an important role.  Because skin is not sterile, your skin needs to be as free of germs as possible.   You can reduce the number of germs on your skin by washing with CHG (chlorahexidine gluconate) soap before surgery.  CHG is an antiseptic cleaner which kills germs and bonds with the skin to continue killing germs even after washing. Please DO NOT use if you have an allergy to CHG or antibacterial soaps.  If your skin becomes reddened/irritated stop using the CHG and inform your nurse when you arrive at Short Stay. Do not shave (including legs and underarms) for at least 48 hours prior to the first CHG shower.  You may shave your face/neck. Please follow these instructions carefully:  1.  Shower with CHG Soap the night before surgery and the  morning of Surgery.  2.  If you choose to wash your hair, wash your hair first as usual with your  normal  shampoo.  3.  After you shampoo, rinse your hair and body thoroughly to remove the  shampoo.                           4.  Use CHG as you would any other liquid soap.  You can apply chg directly  to the skin and wash                       Gently with a scrungie or clean washcloth.  5.  Apply the CHG Soap to your body ONLY FROM THE NECK DOWN.   Do not use on face/ open  Wound or open sores. Avoid contact with eyes, ears mouth and genitals (private parts).                       Wash face,  Genitals (private parts) with your normal soap.             6.  Wash thoroughly, paying special attention to the area where your surgery  will be performed.  7.  Thoroughly rinse your body with warm water from the neck down.  8.  DO NOT shower/wash with your normal soap after using and rinsing off  the CHG Soap.                9.  Pat yourself dry with a clean towel.            10.  Wear clean pajamas.            11.  Place clean sheets on your bed the night of your first shower and do not  sleep with pets. Day of Surgery : Do not apply any lotions/deodorants the morning of surgery.  Please wear clean clothes to the hospital/surgery  center.  FAILURE TO FOLLOW THESE INSTRUCTIONS MAY RESULT IN THE CANCELLATION OF YOUR SURGERY PATIENT SIGNATURE_________________________________  NURSE SIGNATURE__________________________________  ________________________________________________________________________ WHAT IS A BLOOD TRANSFUSION? Blood Transfusion Information  A transfusion is the replacement of blood or some of its parts. Blood is made up of multiple cells which provide different functions. Red blood cells carry oxygen and are used for blood loss replacement. White blood cells fight against infection. Platelets control bleeding. Plasma helps clot blood. Other blood products are available for specialized needs, such as hemophilia or other clotting disorders. BEFORE THE TRANSFUSION  Who gives blood for transfusions?  Healthy volunteers who are fully evaluated to make sure their blood is safe. This is blood bank blood. Transfusion therapy is the safest it has ever been in the practice of medicine. Before blood is taken from a donor, a complete history is taken to make sure that person has no history of diseases nor engages in risky social behavior (examples are intravenous drug use or sexual activity with multiple partners). The donor's travel history is screened to minimize risk of transmitting infections, such as malaria. The donated blood is tested for signs of infectious diseases, such as HIV and hepatitis. The blood is then tested to be sure it is compatible with you in order to minimize the chance of a transfusion reaction. If you or a relative donates blood, this is often done in anticipation of surgery and is not appropriate for emergency situations. It takes many days to process the donated blood. RISKS AND COMPLICATIONS Although transfusion therapy is very safe and saves many lives, the main dangers of transfusion include:  Getting an infectious disease. Developing a transfusion reaction. This is an allergic reaction  to something in the blood you were given. Every precaution is taken to prevent this. The decision to have a blood transfusion has been considered carefully by your caregiver before blood is given. Blood is not given unless the benefits outweigh the risks. AFTER THE TRANSFUSION Right after receiving a blood transfusion, you will usually feel much better and more energetic. This is especially true if your red blood cells have gotten low (anemic). The transfusion raises the level of the red blood cells which carry oxygen, and this usually causes an energy increase. The nurse administering the transfusion will monitor you carefully for complications.  HOME CARE INSTRUCTIONS  No special instructions are needed after a transfusion. You may find your energy is better. Speak with your caregiver about any limitations on activity for underlying diseases you may have. SEEK MEDICAL CARE IF:  Your condition is not improving after your transfusion. You develop redness or irritation at the intravenous (IV) site. SEEK IMMEDIATE MEDICAL CARE IF:  Any of the following symptoms occur over the next 12 hours: Shaking chills. You have a temperature by mouth above 102 F (38.9 C), not controlled by medicine. Chest, back, or muscle pain. People around you feel you are not acting correctly or are confused. Shortness of breath or difficulty breathing. Dizziness and fainting. You get a rash or develop hives. You have a decrease in urine output. Your urine turns a dark color or changes to pink, red, or brown. Any of the following symptoms occur over the next 10 days: You have a temperature by mouth above 102 F (38.9 C), not controlled by medicine. Shortness of breath. Weakness after normal activity. The white part of the eye turns yellow (jaundice). You have a decrease in the amount of urine or are urinating less often. Your urine turns a dark color or changes to pink, red, or brown. Document Released: 12/08/2000  Document Revised: 03/04/2012 Document Reviewed: 07/27/2008 [facility] Patient Information 2014 [place], [place].  _______________________________________________________________________

## 2022-11-10 ENCOUNTER — Encounter: Payer: Self-pay | Admitting: Gynecologic Oncology

## 2022-11-10 ENCOUNTER — Other Ambulatory Visit: Payer: Self-pay

## 2022-11-10 ENCOUNTER — Encounter (HOSPITAL_COMMUNITY)
Admission: RE | Admit: 2022-11-10 | Discharge: 2022-11-10 | Disposition: A | Discharge status: Home / Self Care | Payer: No Typology Code available for payment source | Source: Ambulatory Visit | Attending: Gynecologic Oncology | Admitting: Gynecologic Oncology

## 2022-11-10 ENCOUNTER — Encounter (HOSPITAL_COMMUNITY): Payer: Self-pay

## 2022-11-10 VITALS — BP 155/79 | HR 66 | Temp 98.2°F | Ht 63.0 in | Wt 254.0 lb

## 2022-11-10 DIAGNOSIS — R7303 Prediabetes: Secondary | ICD-10-CM | POA: Diagnosis not present

## 2022-11-10 DIAGNOSIS — N9489 Other specified conditions associated with female genital organs and menstrual cycle: Secondary | ICD-10-CM | POA: Diagnosis not present

## 2022-11-10 DIAGNOSIS — Z01812 Encounter for preprocedural laboratory examination: Secondary | ICD-10-CM | POA: Insufficient documentation

## 2022-11-10 HISTORY — DX: Prediabetes: R73.03

## 2022-11-10 LAB — COMPREHENSIVE METABOLIC PANEL
ALT: 22 U/L (ref 0–44)
AST: 18 U/L (ref 15–41)
Albumin: 3.6 g/dL (ref 3.5–5.0)
Alkaline Phosphatase: 63 U/L (ref 38–126)
Anion gap: 5 (ref 5–15)
BUN: 13 mg/dL (ref 6–20)
CO2: 24 mmol/L (ref 22–32)
Calcium: 8.9 mg/dL (ref 8.9–10.3)
Chloride: 109 mmol/L (ref 98–111)
Creatinine, Ser: 0.61 mg/dL (ref 0.44–1.00)
GFR, Estimated: >60 mL/min (ref >60)
Glucose, Bld: 93 mg/dL (ref 70–99)
Potassium: 4.1 mmol/L (ref 3.5–5.1)
Sodium: 138 mmol/L (ref 135–145)
Total Bilirubin: 0.7 mg/dL (ref 0.3–1.2)
Total Protein: 7.3 g/dL (ref 6.5–8.1)

## 2022-11-10 LAB — CMP14+EGFR
ALT: 21 IU/L (ref 0–32)
AST: 20 IU/L (ref 0–40)
Albumin/Globulin Ratio: 1.4 (ref 1.2–2.2)
Albumin: 4 g/dL (ref 3.9–4.9)
Alkaline Phosphatase: 78 IU/L (ref 44–121)
BUN/Creatinine Ratio: 21 (ref 9–23)
BUN: 13 mg/dL (ref 6–20)
Bilirubin Total: 0.5 mg/dL (ref 0.0–1.2)
CO2: 21 mmol/L (ref 20–29)
Calcium: 9.2 mg/dL (ref 8.7–10.2)
Chloride: 104 mmol/L (ref 96–106)
Creatinine, Ser: 0.63 mg/dL (ref 0.57–1.00)
Globulin, Total: 2.8 g/dL (ref 1.5–4.5)
Glucose: 86 mg/dL (ref 70–99)
Potassium: 4.4 mmol/L (ref 3.5–5.2)
Sodium: 139 mmol/L (ref 134–144)
Total Protein: 6.8 g/dL (ref 6.0–8.5)
eGFR: 116 mL/min/{1.73_m2} (ref >59)

## 2022-11-10 LAB — TYPE AND SCREEN
ABO/RH(D): B POS
Antibody Screen: NEGATIVE

## 2022-11-10 LAB — CBC
HCT: 41.1 % (ref 36.0–46.0)
Hemoglobin: 13 g/dL (ref 12.0–15.0)
MCH: 26.6 pg (ref 26.0–34.0)
MCHC: 31.6 g/dL (ref 30.0–36.0)
MCV: 84 fL (ref 80.0–100.0)
Platelets: 356 10*3/uL (ref 150–400)
RBC: 4.89 MIL/uL (ref 3.87–5.11)
RDW: 14.5 % (ref 11.5–15.5)
WBC: 7.4 10*3/uL (ref 4.0–10.5)
nRBC: 0 % (ref 0.0–0.2)

## 2022-11-10 LAB — HEMOGLOBIN A1C
Est. average glucose Bld gHb Est-mCnc: 114 mg/dL
Hgb A1c MFr Bld: 5.6 % (ref 4.8–5.6)
Hgb A1c MFr Bld: 5.2 % (ref 4.8–5.6)
Mean Plasma Glucose: 102.54 mg/dL

## 2022-11-10 LAB — VITAMIN D 25 HYDROXY (VIT D DEFICIENCY, FRACTURES): Vit D, 25-Hydroxy: 23.3 ng/mL — ABNORMAL LOW (ref 30.0–100.0)

## 2022-11-10 LAB — INSULIN, RANDOM: INSULIN: 20 u[IU]/mL (ref 2.6–24.9)

## 2022-11-10 LAB — GLUCOSE, CAPILLARY: Glucose-Capillary: 96 mg/dL (ref 70–99)

## 2022-11-10 NOTE — Progress Notes (Addendum)
For Short Stay: [place] appointment date:  Bowel Prep reminder:   For Anesthesia: PCP - Dr. Kelton Pillar Cardiologist -   Chest x-ray -  EKG -  Stress Test -  ECHO -  Cardiac Cath -  Pacemaker/ICD device last checked: Pacemaker orders received: Device Rep notified:  Spinal Cord Stimulator:  Sleep Study - Yes CPAP - NO  Fasting Blood Sugar - N/A Checks Blood Sugar ___0__ times a day Date and result of last Hgb A1c-  Last dose of GLP1 agonist- Semaglutide will be held 7 days before surgery. GLP1 instructions:   Last dose of SGLT-2 inhibitors-  SGLT-2 instructions:   Blood Thinner Instructions: Aspirin Instructions: Last Dose:  Activity level: Can go up a flight of stairs and activities of daily living without stopping and without chest pain and/or shortness of breath   Able to exercise without chest pain and/or shortness of breath   Unable to go up a flight of stairs without chest pain and/or shortness of breath     Anesthesia review: Hx: Pre-DIA  Patient denies shortness of breath, fever, cough and chest pain at PAT appointment   Patient verbalized understanding of instructions that were given to them at the PAT appointment. Patient was also instructed that they will need to review over the PAT instructions again at home before surgery.

## 2022-11-13 ENCOUNTER — Telehealth: Payer: Self-pay

## 2022-11-13 NOTE — Telephone Encounter (Signed)
I called and LVM for pt today in response to [facility] paperwork. We need additional information in order to feel them out appropriately.  MyChart message also sent to pt.

## 2022-11-13 NOTE — Telephone Encounter (Signed)
Voicemail left 11/10/2022 retrieved today by this forms nurse forwarded to [place].   "Heather Boone, D.O.B. 11/05/1981, [phone] (home) .  Came to office about paperwork needing to be filled out.  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  Call me if there is any additional information I need to fill out, and when paperwork is completed."

## 2022-11-13 NOTE — Telephone Encounter (Signed)
Pt called stating she works from home and wants her time from work to be intermittent in case she is in pain and needs to take time away from the phone for a few hours at a time. Pt aware the office is working on the [employer] forms and will fax them once completed.  Joylene John NP notified.

## 2022-11-14 NOTE — Telephone Encounter (Signed)
Patient's FMLA paperwork fax

## 2022-11-20 ENCOUNTER — Encounter: Payer: Self-pay | Admitting: Obstetrics & Gynecology

## 2022-11-20 NOTE — Telephone Encounter (Signed)
It appears patient is scheduled for surgery on 11/23/22 with Dr.Tucker.

## 2022-11-21 NOTE — Progress Notes (Signed)
   Chief Complaint:   OBESITY Heather Boone is here to discuss her progress with her obesity treatment plan along with follow-up of her obesity related diagnoses. Heather Boone is on keeping a food journal and adhering to recommended goals of 1200-1500 calories and 85+ grams of protein daily and states she is following her eating plan approximately 80% of the time. Heather Boone states she is walking for 30 minutes 7 times per week.  Today's visit was #: 7 Starting weight: 257 lbs Starting date: 01/03/2022 Today's weight: 253 lbs Today's date: 11/09/2022 Total lbs lost to date: 4 Total lbs lost since last in-office visit: 4  Interim History: Heather Boone continues to work on her weight loss. She is doing well overall with no GI upset or hypoglycemia on Wegovy.  Subjective:   1. Prediabetes Heather Boone on GLP-1, and she is due for labs. She is working on her weight loss.   2. Vitamin D deficiency Heather Boone is on Vitamin D, and she is due for labs.   3. Other depression, with emotional eating  Heather Boone is on Wellbutrin, and she is doing better with decreasing her emotional eating. No side effects were noted.   Assessment/Plan:   1. Prediabetes We will check labs today. Heather Boone will continue with her diet and exercise.   - Hemoglobin A1c - Insulin, random - CMP14+EGFR  2. Vitamin D deficiency We will check labs today, and we will refill prescription Vitamin D for 1 month. Heather Boone will follow-up for routine testing of Vitamin D, at least 2-3 times per year to avoid over-replacement.  - Cholecalciferol (VITAMIN D3) 1.25 MG (50000 UT) CAPS; Take 1 capsule by mouth once a week. (Patient taking differently: Take 50,000 Units by mouth once a week. Friday)  Dispense: 4 capsule; Refill: 0 - VITAMIN D 25 Hydroxy (Vit-D Deficiency, Fractures)  3. Other depression, with emotional eating  Heather Boone will continue Wellbutrin SR 200 mg BID and we will refill for 1 month.   - buPROPion (WELLBUTRIN SR) 200 MG  12 hr tablet; Take 1 tablet (200 mg total) by mouth 2 (two) times daily.  Dispense: 60 tablet; Refill: 0  4. Obesity, Current BMI 44.9 Heather Boone is currently in the action stage of change. As such, her goal is to continue with weight loss efforts. She has agreed to keeping a food journal and adhering to recommended goals of 1200-1500 calories and 85+ grams of protein daily.   Heather Boone will continue Wegovy 1.7 mg once weekly, and we will refill for 1 month.   - Semaglutide-Weight Management 1.7 MG/0.75ML SOAJ; Inject 1.7 mg into the skin once a week. (Patient taking differently: Inject 1.7 mg into the skin once a week. Wednesday)  Dispense: 3 mL; Refill: 0  Exercise goals: As is.   Behavioral modification strategies: increasing lean protein intake and holiday eating strategies .  Heather Boone has agreed to follow-up with our clinic in 4 weeks. She was informed of the importance of frequent follow-up visits to maximize her success with intensive lifestyle modifications for her multiple health conditions.   Objective:   Blood pressure 115/65, pulse 72, temperature 98.7 F (37.1 C), height 5' 3" (1.6 m), weight 253 lb (114.8 kg), SpO2 98 %. Body mass index is 44.82 kg/m.  General: Cooperative, alert, well developed, in no acute distress. HEENT: Conjunctivae and lids unremarkable. Cardiovascular: Regular rhythm.  Lungs: Normal work of breathing. Neurologic: No focal deficits.   Lab Results  Component Value Date   CREATININE 0.61 11/10/2022   BUN 13 11/10/2022     NA 138 11/10/2022   K 4.1 11/10/2022   CL 109 11/10/2022   CO2 24 11/10/2022   Lab Results  Component Value Date   ALT 22 11/10/2022   AST 18 11/10/2022   ALKPHOS 63 11/10/2022   BILITOT 0.7 11/10/2022   Lab Results  Component Value Date   HGBA1C 5.2 11/10/2022   HGBA1C 5.6 11/09/2022   HGBA1C 5.7 (H) 01/03/2022   HGBA1C 5.2 03/10/2020   HGBA1C 5.2 09/29/2019   Lab Results  Component Value Date   INSULIN 20.0  11/09/2022   INSULIN 12.0 01/03/2022   INSULIN 15.6 03/10/2020   INSULIN 13.5 09/29/2019   INSULIN 14.4 12/04/2018   Lab Results  Component Value Date   TSH 1.82 06/19/2022   Lab Results  Component Value Date   CHOL 161 01/03/2022   HDL 64 01/03/2022   LDLCALC 82 01/03/2022   TRIG 82 01/03/2022   CHOLHDL 2.5 01/03/2022   Lab Results  Component Value Date   VD25OH 23.3 (L) 11/09/2022   VD25OH 27.5 (L) 01/03/2022   VD25OH 22.4 (L) 03/10/2020   Lab Results  Component Value Date   WBC 7.4 11/10/2022   HGB 13.0 11/10/2022   HCT 41.1 11/10/2022   MCV 84.0 11/10/2022   PLT 356 11/10/2022   No results found for: "IRON", "TIBC", "FERRITIN"  Attestation Statements:   Reviewed by clinician on day of visit: allergies, medications, problem list, medical history, surgical history, family history, social history, and previous encounter notes.   I, Sharon Martin, am acting as transcriptionist for Caren Beasley, MD.  I have reviewed the above documentation for accuracy and completeness, and I agree with the above. -  Caren Beasley, MD   

## 2022-11-22 ENCOUNTER — Telehealth: Payer: Self-pay

## 2022-11-22 NOTE — Telephone Encounter (Signed)
Telephone call to check on pre-operative status.  Patient compliant with pre-operative instructions.  Reinforced nothing to eat after midnight. Clear liquids until 0430. Patient to arrive at [place].  No questions or concerns voiced.  Instructed to call for any needs.  Pt states she has been in [place] and did not get to pick up her medication that was sent in on 10/6. Can they be resent to [facility] in [place]?   I called pharmacy to make sure Rx was not picked up and they stated Rx was not picked up by pt and they can have it ready for her today.  Pt is aware.

## 2022-11-23 ENCOUNTER — Ambulatory Visit (HOSPITAL_COMMUNITY): Payer: No Typology Code available for payment source | Admitting: Certified Registered Nurse Anesthetist

## 2022-11-23 ENCOUNTER — Ambulatory Visit (HOSPITAL_BASED_OUTPATIENT_CLINIC_OR_DEPARTMENT_OTHER): Payer: No Typology Code available for payment source | Admitting: Certified Registered Nurse Anesthetist

## 2022-11-23 ENCOUNTER — Encounter (HOSPITAL_COMMUNITY): Payer: Self-pay | Admitting: Gynecologic Oncology

## 2022-11-23 ENCOUNTER — Encounter (HOSPITAL_COMMUNITY)
Admission: RE | Disposition: A | Discharge status: Home / Self Care | Payer: Self-pay | Source: Home / Self Care | Attending: Gynecologic Oncology

## 2022-11-23 ENCOUNTER — Other Ambulatory Visit: Payer: Self-pay

## 2022-11-23 ENCOUNTER — Ambulatory Visit (HOSPITAL_COMMUNITY)
Admission: RE | Admit: 2022-11-23 | Discharge: 2022-11-23 | Disposition: A | Discharge status: Home / Self Care | Payer: No Typology Code available for payment source | Attending: Gynecologic Oncology | Admitting: Gynecologic Oncology

## 2022-11-23 DIAGNOSIS — N8301 Follicular cyst of right ovary: Secondary | ICD-10-CM

## 2022-11-23 DIAGNOSIS — N736 Female pelvic peritoneal adhesions (postinfective): Secondary | ICD-10-CM | POA: Diagnosis not present

## 2022-11-23 DIAGNOSIS — Z6841 Body Mass Index (BMI) 40.0 and over, adult: Secondary | ICD-10-CM

## 2022-11-23 DIAGNOSIS — N9489 Other specified conditions associated with female genital organs and menstrual cycle: Secondary | ICD-10-CM

## 2022-11-23 DIAGNOSIS — N838 Other noninflammatory disorders of ovary, fallopian tube and broad ligament: Secondary | ICD-10-CM | POA: Diagnosis not present

## 2022-11-23 DIAGNOSIS — D259 Leiomyoma of uterus, unspecified: Secondary | ICD-10-CM

## 2022-11-23 DIAGNOSIS — K219 Gastro-esophageal reflux disease without esophagitis: Secondary | ICD-10-CM | POA: Insufficient documentation

## 2022-11-23 DIAGNOSIS — F32A Depression, unspecified: Secondary | ICD-10-CM | POA: Insufficient documentation

## 2022-11-23 HISTORY — PX: ROBOTIC ASSISTED SALPINGO OOPHERECTOMY: SHX6082

## 2022-11-23 LAB — HCG, QUANTITATIVE, PREGNANCY: hCG, Beta Chain, Quant, S: <1 m[IU]/mL (ref <5)

## 2022-11-23 LAB — ABO/RH: ABO/RH(D): B POS

## 2022-11-23 SURGERY — SALPINGO-OOPHORECTOMY, ROBOT-ASSISTED
Anesthesia: General | Laterality: Right

## 2022-11-23 MED ORDER — FENTANYL CITRATE (PF) 250 MCG/5ML IJ SOLN
INTRAMUSCULAR | Status: DC | PRN
  Administered 2022-11-23: 100 ug via INTRAVENOUS
  Administered 2022-11-23 (×3): 50 ug via INTRAVENOUS

## 2022-11-23 MED ORDER — HYDROMORPHONE HCL 1 MG/ML IJ SOLN
INTRAMUSCULAR | Status: AC
  Administered 2022-11-23: 0.5 mg via INTRAVENOUS
  Filled 2022-11-23: qty 1

## 2022-11-23 MED ORDER — DEXAMETHASONE SODIUM PHOSPHATE 10 MG/ML IJ SOLN
INTRAMUSCULAR | Status: DC | PRN
  Administered 2022-11-23: 10 mg via INTRAVENOUS

## 2022-11-23 MED ORDER — DEXAMETHASONE SODIUM PHOSPHATE 10 MG/ML IJ SOLN
INTRAMUSCULAR | Status: AC
  Filled 2022-11-23: qty 1

## 2022-11-23 MED ORDER — ROCURONIUM BROMIDE 10 MG/ML (PF) SYRINGE
PREFILLED_SYRINGE | INTRAVENOUS | Status: AC
  Filled 2022-11-23: qty 10

## 2022-11-23 MED ORDER — HYDROMORPHONE HCL 1 MG/ML IJ SOLN
0.2500 mg | INTRAMUSCULAR | Status: DC | PRN
  Administered 2022-11-23: 0.25 mg via INTRAVENOUS
  Administered 2022-11-23: 0.5 mg via INTRAVENOUS

## 2022-11-23 MED ORDER — AMISULPRIDE (ANTIEMETIC) 5 MG/2ML IV SOLN: 10.0000 mg | Freq: Once | INTRAVENOUS | Status: DC | PRN

## 2022-11-23 MED ORDER — PROPOFOL 500 MG/50ML IV EMUL
INTRAVENOUS | Status: AC
  Filled 2022-11-23: qty 50

## 2022-11-23 MED ORDER — LACTATED RINGERS IR SOLN
Status: DC | PRN
  Administered 2022-11-23: 1000 mL

## 2022-11-23 MED ORDER — SUGAMMADEX SODIUM 500 MG/5ML IV SOLN
INTRAVENOUS | Status: AC
  Filled 2022-11-23: qty 5

## 2022-11-23 MED ORDER — LIDOCAINE 2% (20 MG/ML) 5 ML SYRINGE
INTRAMUSCULAR | Status: DC | PRN
  Administered 2022-11-23: 100 mg via INTRAVENOUS

## 2022-11-23 MED ORDER — FENTANYL CITRATE (PF) 250 MCG/5ML IJ SOLN
INTRAMUSCULAR | Status: AC
  Filled 2022-11-23: qty 5

## 2022-11-23 MED ORDER — PROMETHAZINE HCL 25 MG/ML IJ SOLN: 6.2500 mg | INTRAMUSCULAR | Status: DC | PRN

## 2022-11-23 MED ORDER — PROPOFOL 10 MG/ML IV BOLUS
INTRAVENOUS | Status: DC | PRN
  Administered 2022-11-23: 200 mg via INTRAVENOUS

## 2022-11-23 MED ORDER — ONDANSETRON HCL 4 MG/2ML IJ SOLN
INTRAMUSCULAR | Status: AC
  Filled 2022-11-23: qty 2

## 2022-11-23 MED ORDER — ROCURONIUM BROMIDE 10 MG/ML (PF) SYRINGE
PREFILLED_SYRINGE | INTRAVENOUS | Status: DC | PRN
  Administered 2022-11-23: 100 mg via INTRAVENOUS

## 2022-11-23 MED ORDER — MEPERIDINE HCL 50 MG/ML IJ SOLN: 6.2500 mg | INTRAMUSCULAR | Status: DC | PRN

## 2022-11-23 MED ORDER — DEXAMETHASONE SODIUM PHOSPHATE 4 MG/ML IJ SOLN: 4.0000 mg | INTRAMUSCULAR | Status: DC

## 2022-11-23 MED ORDER — ONDANSETRON HCL 4 MG/2ML IJ SOLN
INTRAMUSCULAR | Status: DC | PRN
  Administered 2022-11-23: 4 mg via INTRAVENOUS

## 2022-11-23 MED ORDER — LIDOCAINE HCL (PF) 2 % IJ SOLN
INTRAMUSCULAR | Status: AC
  Filled 2022-11-23: qty 5

## 2022-11-23 MED ORDER — SCOPOLAMINE 1 MG/3DAYS TD PT72
1.0000 | MEDICATED_PATCH | TRANSDERMAL | Status: DC
  Administered 2022-11-23: 1.5 mg via TRANSDERMAL
  Filled 2022-11-23: qty 1

## 2022-11-23 MED ORDER — MIDAZOLAM HCL 2 MG/2ML IJ SOLN
INTRAMUSCULAR | Status: DC | PRN
  Administered 2022-11-23: 2 mg via INTRAVENOUS

## 2022-11-23 MED ORDER — HEPARIN SODIUM (PORCINE) 5000 UNIT/ML IJ SOLN
5000.0000 [IU] | INTRAMUSCULAR | Status: AC
  Administered 2022-11-23: 5000 [IU] via SUBCUTANEOUS
  Filled 2022-11-23: qty 1

## 2022-11-23 MED ORDER — BUPIVACAINE HCL 0.25 % IJ SOLN
INTRAMUSCULAR | Status: AC
  Filled 2022-11-23: qty 1

## 2022-11-23 MED ORDER — OXYCODONE HCL 5 MG/5ML PO SOLN: 5.0000 mg | Freq: Once | ORAL | Status: AC | PRN

## 2022-11-23 MED ORDER — OXYCODONE HCL 5 MG PO TABS: 5.0000 mg | ORAL_TABLET | Freq: Once | ORAL | Status: AC | PRN

## 2022-11-23 MED ORDER — ACETAMINOPHEN 500 MG PO TABS
1000.0000 mg | ORAL_TABLET | ORAL | Status: AC
  Administered 2022-11-23: 1000 mg via ORAL
  Filled 2022-11-23: qty 2

## 2022-11-23 MED ORDER — SUGAMMADEX SODIUM 200 MG/2ML IV SOLN
INTRAVENOUS | Status: DC | PRN
  Administered 2022-11-23: 300 mg via INTRAVENOUS

## 2022-11-23 MED ORDER — PHENYLEPHRINE 80 MCG/ML (10ML) SYRINGE FOR IV PUSH (FOR BLOOD PRESSURE SUPPORT)
PREFILLED_SYRINGE | INTRAVENOUS | Status: DC | PRN
  Administered 2022-11-23: 160 ug via INTRAVENOUS

## 2022-11-23 MED ORDER — ORAL CARE MOUTH RINSE: 15.0000 mL | Freq: Once | OROMUCOSAL | Status: AC

## 2022-11-23 MED ORDER — PHENYLEPHRINE 80 MCG/ML (10ML) SYRINGE FOR IV PUSH (FOR BLOOD PRESSURE SUPPORT)
PREFILLED_SYRINGE | INTRAVENOUS | Status: AC
  Filled 2022-11-23: qty 10

## 2022-11-23 MED ORDER — HYDROMORPHONE HCL 1 MG/ML IJ SOLN
INTRAMUSCULAR | Status: AC
  Administered 2022-11-23: 0.25 mg via INTRAVENOUS
  Filled 2022-11-23: qty 1

## 2022-11-23 MED ORDER — BUPIVACAINE HCL (PF) 0.25 % IJ SOLN
INTRAMUSCULAR | Status: DC | PRN
  Administered 2022-11-23: 40 mL

## 2022-11-23 MED ORDER — GABAPENTIN 300 MG PO CAPS
300.0000 mg | ORAL_CAPSULE | ORAL | Status: AC
  Administered 2022-11-23: 300 mg via ORAL
  Filled 2022-11-23: qty 1

## 2022-11-23 MED ORDER — OXYCODONE HCL 5 MG PO TABS
ORAL_TABLET | ORAL | Status: AC
  Administered 2022-11-23: 5 mg via ORAL
  Filled 2022-11-23: qty 1

## 2022-11-23 MED ORDER — CHLORHEXIDINE GLUCONATE 0.12 % MT SOLN
15.0000 mL | Freq: Once | OROMUCOSAL | Status: AC
  Administered 2022-11-23: 15 mL via OROMUCOSAL

## 2022-11-23 MED ORDER — LACTATED RINGERS IV SOLN: INTRAVENOUS | Status: DC

## 2022-11-23 MED ORDER — MIDAZOLAM HCL 2 MG/2ML IJ SOLN
INTRAMUSCULAR | Status: AC
  Filled 2022-11-23: qty 2

## 2022-11-23 SURGICAL SUPPLY — 93 items
APPLICATOR SURGIFLO ENDO (HEMOSTASIS) IMPLANT
BAG COUNTER SPONGE SURGICOUNT (BAG) IMPLANT
BAG LAPAROSCOPIC 12 15 PORT 16 (BASKET) IMPLANT
BAG RETRIEVAL 12/15 (BASKET)
BLADE SURG SZ10 CARB STEEL (BLADE) IMPLANT
CABLE HIGH FREQUENCY MONO STRZ (ELECTRODE) IMPLANT
CHLORAPREP W/TINT 26 (MISCELLANEOUS) ×3 IMPLANT
CNTNR URN SCR LID CUP LEK RST (MISCELLANEOUS) IMPLANT
CONT SPEC 4OZ STRL OR WHT (MISCELLANEOUS)
COVER BACK TABLE 60X90IN (DRAPES) ×2 IMPLANT
COVER SURGICAL LIGHT HANDLE (MISCELLANEOUS) ×2 IMPLANT
COVER TIP SHEARS 8 DVNC (MISCELLANEOUS) ×2 IMPLANT
COVER TIP SHEARS 8MM DA VINCI (MISCELLANEOUS) ×2
DERMABOND ADVANCED .7 DNX12 (GAUZE/BANDAGES/DRESSINGS) ×2 IMPLANT
DRAPE ARM DVNC X/XI (DISPOSABLE) ×8 IMPLANT
DRAPE COLUMN DVNC XI (DISPOSABLE) ×2 IMPLANT
DRAPE DA VINCI XI ARM (DISPOSABLE) ×8
DRAPE DA VINCI XI COLUMN (DISPOSABLE) ×2
DRAPE SHEET LG 3/4 BI-LAMINATE (DRAPES) ×2 IMPLANT
DRAPE SURG IRRIG POUCH 19X23 (DRAPES) ×2 IMPLANT
DRSG OPSITE POSTOP 4X6 (GAUZE/BANDAGES/DRESSINGS) IMPLANT
DRSG OPSITE POSTOP 4X8 (GAUZE/BANDAGES/DRESSINGS) IMPLANT
ELECT PENCIL ROCKER SW 15FT (MISCELLANEOUS) ×1 IMPLANT
ELECT REM PT RETURN 15FT ADLT (MISCELLANEOUS) ×2 IMPLANT
GAUZE 4X4 16PLY ~~LOC~~+RFID DBL (SPONGE) ×3 IMPLANT
GLOVE BIO SURGEON STRL SZ 6 (GLOVE) ×9 IMPLANT
GLOVE BIO SURGEON STRL SZ 6.5 (GLOVE) ×4 IMPLANT
GLOVE BIO SURGEON STRL SZ7 (GLOVE) ×1 IMPLANT
GLOVE BIOGEL PI IND STRL 7.0 (GLOVE) ×1 IMPLANT
GOWN SRG XL LVL 4 BRTHBL STRL (GOWNS) ×1 IMPLANT
GOWN STRL NON-REIN XL LVL4 (GOWNS) ×2
GOWN STRL REUS W/ TWL LRG LVL3 (GOWN DISPOSABLE) ×10 IMPLANT
GOWN STRL REUS W/TWL LRG LVL3 (GOWN DISPOSABLE) ×12
GRASPER SUT TROCAR 14GX15 (MISCELLANEOUS) IMPLANT
HOLDER FOLEY CATH W/STRAP (MISCELLANEOUS) IMPLANT
IRRIG SUCT STRYKERFLOW 2 WTIP (MISCELLANEOUS) ×2
IRRIGATION SUCT STRKRFLW 2 WTP (MISCELLANEOUS) ×2 IMPLANT
KIT BASIN OR (CUSTOM PROCEDURE TRAY) ×1 IMPLANT
KIT PROCEDURE DA VINCI SI (MISCELLANEOUS)
KIT PROCEDURE DVNC SI (MISCELLANEOUS) IMPLANT
KIT TURNOVER KIT A (KITS) IMPLANT
LIGASURE IMPACT 36 18CM CVD LR (INSTRUMENTS) IMPLANT
MANIPULATOR ADVINCU DEL 3.0 PL (MISCELLANEOUS) IMPLANT
MANIPULATOR ADVINCU DEL 3.5 PL (MISCELLANEOUS) IMPLANT
MANIPULATOR UTERINE 4.5 ZUMI (MISCELLANEOUS) ×1 IMPLANT
NDL HYPO 21X1.5 SAFETY (NEEDLE) ×1 IMPLANT
NDL SPNL 18GX3.5 QUINCKE PK (NEEDLE) IMPLANT
NEEDLE HYPO 21X1.5 SAFETY (NEEDLE) ×2 IMPLANT
NEEDLE SPNL 18GX3.5 QUINCKE PK (NEEDLE) IMPLANT
OBTURATOR OPTICAL STANDARD 8MM (TROCAR) ×2
OBTURATOR OPTICAL STND 8 DVNC (TROCAR) ×2
OBTURATOR OPTICALSTD 8 DVNC (TROCAR) ×2 IMPLANT
PACK ROBOT GYN CUSTOM WL (TRAY / TRAY PROCEDURE) ×2 IMPLANT
PAD POSITIONING PINK XL (MISCELLANEOUS) ×2 IMPLANT
PORT ACCESS TROCAR AIRSEAL 12 (TROCAR) ×2 IMPLANT
SCISSORS LAP 5X35 DISP (ENDOMECHANICALS) IMPLANT
SEAL CANN UNIV 5-8 DVNC XI (MISCELLANEOUS) ×8 IMPLANT
SEAL XI 5MM-8MM UNIVERSAL (MISCELLANEOUS) ×8
SEALER TISSUE G2 CVD JAW 45CM (ENDOMECHANICALS) IMPLANT
SET TRI-LUMEN FLTR TB AIRSEAL (TUBING) ×2 IMPLANT
SHEET LAVH (DRAPES) ×1 IMPLANT
SLEEVE Z-THREAD 5X100MM (TROCAR) ×1 IMPLANT
SOL PREP POV-IOD 4OZ 10% (MISCELLANEOUS) ×3 IMPLANT
SPIKE FLUID TRANSFER (MISCELLANEOUS) ×1 IMPLANT
SPONGE T-LAP 18X18 ~~LOC~~+RFID (SPONGE) IMPLANT
SURGIFLO W/THROMBIN 8M KIT (HEMOSTASIS) IMPLANT
SUT MNCRL AB 4-0 PS2 18 (SUTURE) ×4 IMPLANT
SUT PDS AB 1 TP1 96 (SUTURE) IMPLANT
SUT VIC AB 0 CT1 27 (SUTURE)
SUT VIC AB 0 CT1 27XBRD ANTBC (SUTURE) IMPLANT
SUT VIC AB 2-0 CT1 27 (SUTURE)
SUT VIC AB 2-0 CT1 TAPERPNT 27 (SUTURE) IMPLANT
SUT VIC AB 4-0 PS2 18 (SUTURE) ×5 IMPLANT
SUT VLOC 180 0 9IN  GS21 (SUTURE)
SUT VLOC 180 0 9IN GS21 (SUTURE) IMPLANT
SYR 10ML LL (SYRINGE) IMPLANT
SYS BAG RETRIEVAL 10MM (BASKET)
SYS RETRIEVAL 5MM INZII UNIV (BASKET) ×2
SYS WOUND ALEXIS 18CM MED (MISCELLANEOUS)
SYSTEM BAG RETRIEVAL 10MM (BASKET) IMPLANT
SYSTEM RETRIEVL 5MM INZII UNIV (BASKET) ×1 IMPLANT
SYSTEM WOUND ALEXIS 18CM MED (MISCELLANEOUS) IMPLANT
TOWEL OR 17X26 10 PK STRL BLUE (TOWEL DISPOSABLE) ×1 IMPLANT
TOWEL OR NON WOVEN STRL DISP B (DISPOSABLE) ×1 IMPLANT
TRAP SPECIMEN MUCUS 40CC (MISCELLANEOUS) ×1 IMPLANT
TRAY FOLEY MTR SLVR 16FR STAT (SET/KITS/TRAYS/PACK) ×2 IMPLANT
TRAY LAPAROSCOPIC (CUSTOM PROCEDURE TRAY) ×1 IMPLANT
TROCAR ADV FIXATION 12X100MM (TROCAR) IMPLANT
TROCAR BALLN 12MMX100 BLUNT (TROCAR) IMPLANT
TROCAR Z-THREAD OPTICAL 5X100M (TROCAR) ×1 IMPLANT
UNDERPAD 30X36 HEAVY ABSORB (UNDERPADS AND DIAPERS) ×4 IMPLANT
WATER STERILE IRR 1000ML POUR (IV SOLUTION) ×2 IMPLANT
YANKAUER SUCT BULB TIP 10FT TU (MISCELLANEOUS) IMPLANT

## 2022-11-23 NOTE — Transfer of Care (Signed)
Immediate Anesthesia Transfer of Care Note  Patient: Heather Boone  Procedure(s) Performed: POSSIBLE XI ROBOTIC ASSISTED UNILATERAL SALPINGO OOPHORECTOMY (Right)  Patient Location: PACU  Anesthesia Type:General  Level of Consciousness: drowsy and patient cooperative  Airway & Oxygen Therapy: Patient Spontanous Breathing and Patient connected to face mask oxygen  Post-op Assessment: Report given to RN and Post -op Vital signs reviewed and stable  Post vital signs: Reviewed and stable  Last Vitals:  Vitals Value Taken Time  BP 146/98   Temp    Pulse 76 11/23/22 1033  Resp 13 11/23/22 1033  SpO2 100 % 11/23/22 1033  Vitals shown include unvalidated device data.  Last Pain:  Vitals:   11/23/22 0654  TempSrc:   PainSc: 0-No pain         Complications: No notable events documented.

## 2022-11-23 NOTE — Progress Notes (Signed)
Contacted lab to see when Serum HCG test would be finished.  Result should be finished by 0830 am

## 2022-11-23 NOTE — H&P (Signed)
Gynecologic Oncology H&P  11/23/22  Treatment History: Patient was initially seen for an annual exam in June of this year.  This was shortly after she skipped a menstrual cycle in June.  She normally has regular menses that last for approximately 6 days.  Given this irregular menses, patient had some blood work done (TSH and prolactin) and a pelvic ultrasound was ordered.   Pelvic ultrasound exam on 08/17/2022: Anteverted uterus.  Endometrial lining secretory in appearance, measuring 7.2 mm without any obvious mass or thickening.  Echogenic vascular mass seen at the right lateral aspect of the uterus measuring 7.2 x 3.5 x 5 cm.  Difficult to determine uterine versus ovarian origin.  Left ovary is slightly enlarged with positive perfusion.  Simple avascular dominant follicle seen in the left ovary.  Right ovary is not definitively seen.   08/31/2022: Pelvic MRI shows uterus measures 7.6 x 3.3 x 4 cm.  No definitive fibroids noted.  Cervix and vagina are unremarkable.  Within the right ovary, no normal right ovarian tissue seen.  A solid mass with diffuse heterogenous T2 hyperintensity and contrast-enhancement is seen abutting the right lateral uterine wall.  This shows no evidence of internal fat or signal intensity of the fibroid.  It measures 6.9 x 4.4 cm and is suspicious for a solid ovarian neoplasm.  Left ovary measures approximately 4 cm with a benign appearing ovarian cyst, most likely physiologic.  No ascites or adenopathy.   Interval History: Doing well. No changes since last visit except that she met with Dr. Rolin Barry again at [facility] re   Past Medical/Surgical History: Past Medical History:  Diagnosis Date   B12 deficiency    Back pain    Constipation    Fatigue    Heartburn    Obesity    Pre-diabetes    Prediabetes    SOB (shortness of breath) on exertion    resolved (was associated with being sick)   Vitamin D deficiency     Past Surgical History:  Procedure Laterality Date    LAPAROSCOPIC GASTRIC BANDING     dec 2011, [place]    Family History  Problem Relation Age of Onset   Stroke Mother    Hypertension Mother    Hyperlipidemia Mother    Anxiety disorder Mother    Lung cancer Mother    Hypertension Father    Hyperlipidemia Father    Heart disease Father    Obesity Father    Colon cancer Father     Social History   Socioeconomic History   Marital status: Single    Spouse name: Not on file   Number of children: Not on file   Years of education: Not on file   Highest education level: Not on file  Occupational History   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  Tobacco Use   Smoking status: Never   Smokeless tobacco: Never  Vaping Use   Vaping Use: Never used  Substance and Sexual Activity   Alcohol use: No   Drug use: No   Sexual activity: Yes    Partners: Male    Birth control/protection: None    Comment: 1st intercourse- 41, partners- 29,  Other Topics Concern   Not on file  Social History Narrative   Not on file   Social Determinants of Health   Financial Resource Strain: Not on file  Food Insecurity: Not on file  Transportation Needs: Not on file  Physical Activity: Not on file  Stress: Not on  file  Social Connections: Not on file    Current Medications:  Current Facility-Administered Medications:    dexamethasone (DECADRON) injection 4 mg, 4 mg, Intravenous, On Call to OR, Cross, Melissa D, NP   gabapentin (NEURONTIN) capsule 300 mg, 300 mg, Oral, On Call to OR, Cross, Melissa D, NP   heparin injection 5,000 Units, 5,000 Units, Subcutaneous, 120 min pre-op, Cross, Melissa D, NP   lactated ringers infusion, , Intravenous, Continuous, Witman, Carolyn E, MD   scopolamine (TRANSDERM-SCOP) 1 MG/3DAYS 1.5 mg, 1 patch, Transdermal, On Call to OR, Cross, Melissa D, NP  Review of Systems: + appetite changes, pelvic pain Denies fevers, chills, fatigue, unexplained weight changes. Denies hearing loss, neck lumps or  masses, mouth sores, ringing in ears or voice changes. Denies cough or wheezing.  Denies shortness of breath. Denies chest pain or palpitations. Denies leg swelling. Denies abdominal distention, pain, blood in stools, constipation, diarrhea, nausea, vomiting, or early satiety. Denies pain with intercourse, dysuria, frequency, hematuria or incontinence. Denies hot flashes, vaginal bleeding or vaginal discharge.   Denies joint pain, back pain or muscle pain/cramps. Denies itching, rash, or wounds. Denies dizziness, headaches, numbness or seizures. Denies swollen lymph nodes or glands, denies easy bruising or bleeding. Denies anxiety, depression, confusion, or decreased concentration.  Physical Exam: BP (!) 160/99   Pulse 82   Temp (!) 97.4 F (36.3 C) (Oral)   Resp 17   LMP 10/28/2022   SpO2 98%  General: Alert, oriented, no acute distress.  HEENT: Normocephalic, atraumatic. Sclera anicteric.  Chest: Clear to auscultation bilaterally. No wheezes, rhonchi, or rales. Cardiovascular: Regular rate and rhythm, no murmurs, rubs, or gallops.  Abdomen: Obese. Normoactive bowel sounds. Soft, nondistended, nontender to palpation. No masses or hepatosplenomegaly appreciated. No palpable fluid wave.  Well-healed laparoscopic incisions in the upper abdomen. Extremities: Grossly normal range of motion. Warm, well perfused. No edema bilaterally.  Skin: No rashes or lesions.   Laboratory & Radiologic Studies:    Latest Ref Rng & Units 11/10/2022   11:42 AM 01/03/2022   11:53 AM 03/04/2018    7:40 AM  CBC  WBC 4.0 - 10.5 K/uL 7.4  7.5  6.8   Hemoglobin 12.0 - 15.0 g/dL 13.0  13.1  11.7   Hematocrit 36.0 - 46.0 % 41.1  40.5  37.1   Platelets 150 - 400 K/uL 356  373        Latest Ref Rng & Units 11/10/2022   11:42 AM 11/09/2022    9:12 AM 01/03/2022   11:53 AM  BMP  Glucose 70 - 99 mg/dL 93  86  84   BUN 6 - 20 mg/dL _0 Creatinine 0.44 - 1.00 mg/dL 0.61  0.63  0.62   BUN/Creat Ratio  9 - _1 Sodium 135 - 145 mmol/L 138  139  139   Potassium 3.5 - 5.1 mmol/L 4.1  4.4  4.7   Chloride 98 - 111 mmol/L 109  104  103   CO2 22 - 32 mmol/L _2 Calcium 8.9 - 10.3 mg/dL 8.9  9.2  9.2    Assessment & Plan: Heather Boone is a 41 y.o. woman who strongly desires to maintain fertility presenting with a solid-appearing adnexal mass.   Prior counseling on 10/6. Plan for dx lsc. If mass is a fibroid, will live in situ. If mass is a solid ovarian lesion, plan for UO vs  USO with  frozen section, fertility sparing staging if indicated.  Jeral Pinch, MD  Division of Gynecologic Oncology  Department of Obstetrics and Gynecology  [facility]

## 2022-11-23 NOTE — Op Note (Signed)
OPERATIVE NOTE  Pre-operative Diagnosis: Solid adnexal mass  Post-operative Diagnosis: same, 6 cm broad ligament fibroid  Operation: Robotic-assisted right salpingo-oophorectomy   Surgeon: Jeral Pinch MD  Assistant Surgeon: Joylene John  Anesthesia: GET  Urine Output: 200 cc  Operative Findings: On EUA, mobile uterus with firm mass posterior and to the right of the uterus. ON intra-abdominal entry, normal upper abdominal survey with adhesions from prior lap band surgery. Lap band noted to be in place. Normal omentum, small and large bowel. No ascites. Uterus 8 cm and normal in appearing. Bilateral tubes somewhat dilated and clubbed. Left ovary normal in appearing. Right ovary elongated with 6 cm solid/smooth mass appearing to be eminating from inferior aspect of the ovary. Only after careful dissection of the tube and ovary (once blood supply to both taken were these structures able to be separated from the solid mass. The mass itself appeared to be attached to the lateral right uterus by a large base. It filled much of the retroperitoneum lateral to the uterus. Given my concern that attempt at excision of the presumed broad ligament fibroid (only apparent not to be ovarian in origin once the ovary was excised) could cause significant bleeding and risks requiring urgent hysterectomy, excision of the mass abandoned. Pictures taken (below).  Estimated Blood Loss:  25 cc      Total IV Fluids: see I&O flowsheet         Specimens: right tube and ovary, pelvic washings         Complications:  None apparent; patient tolerated the procedure well.         Disposition: PACU - hemodynamically stable.  Procedure Details  The patient was seen in the Holding Room. The risks, benefits, complications, treatment options, and expected outcomes were discussed with the patient.  The patient concurred with the proposed plan, giving informed consent.  The site of surgery properly noted/marked. The  patient was identified as Heather Boone and the procedure verified as a Robotic-assisted unilateral salpingo-oophorectomy with any other indicated procedures.   After induction of anesthesia, the patient was draped and prepped in the usual sterile manner. Patient was placed in supine position after anesthesia and draped and prepped in the usual sterile manner as follows: Her arms were tucked to her side with all appropriate precautions.  The patient was secured to the bed using padding and tape across her chest.  The patient was placed in the semi-lithotomy position in [place].  The perineum and vagina were prepped with Betadine. The patient's abdomen was prepped with ChloraPrep and then she was draped after the prep had been allowed to dry for 3 minutes.  A Time Out was held and the above information confirmed.  The urethra was prepped with Betadine. Foley catheter was placed.  A sterile speculum was placed in the vagina.  The cervix was grasped with a single-tooth tenaculum. The cervix was dilated with Kennon Rounds dilators.  The ZUMI uterine manipulator without a colpotomizer ring was placed without difficulty.  A pneum occluder balloon was placed over the manipulator.  OG tube placement was confirmed and to suction.   Next, a 5 mm skin incision was made 6 cm below the subcostal margin in the midclavicular line (more inferior secondary to prior lap band surgery).  The 5 mm Optiview port and scope was used for direct entry.  Opening pressure was under 10 mm CO2.  The abdomen was insufflated, that patient placed in steep Trendelenburg, and the findings were noted as above;  given difficulty identifying whether pelvic mass was uterine or ovarian in origin, decision made to proceed to robotic approach.   At this point and all points during the procedure, the patient's intra-abdominal pressure did not exceed 15 mmHg. Next, an 8 mm skin incision was made superior to the umbilicus and a right and left port were  placed about 8 cm lateral to the robot port on the right and left side.  A fourth arm was placed on the right.  All ports were placed under direct visualization.   The robot was docked in the normal manner.  Pelvic washings were collected. The right adnexa was again examined and pictures taken. Filmy adhesions between the sigmoid and rectal mesentery and the uterus and right adnexal mass. After examination of the right adnexa, the solid mass appeared to originate from the superior and posterior aspect of the right ovary. The decision was made to proceed with RSO with mass excision.  The right peritoneum was opened parallel to the IP ligament to open the retroperitoneal space. The round ligament was preserved. The ureter was noted to be on the medial leaf of the broad ligament.  The peritoneum above the ureter was incised and stretched and the infundibulopelvic ligament was skeletonized, cauterized and cut.  The utero-ovarian ligament and fallopian tube were skeletonized, cauterized and transected just lateral to the uterine fundus. The right tube and ovary were elevated and with careful blunt dissection and short bursts of electrocautery, the right adnexa was able to be freed from the underlying solid mass without any obvious attachment. This was placed in a 10 mm bag inserted through the left robotic trocar.  Given concern for significant bleeding and likely attachment to the uterus, the decision was made to abandon attempt at excision of the mass. The location of the mass would have very likely precluded safe access to the ovary if the patient pursues IVF. The appearance of bilateral tubes may explain part of her history of infertility.  Irrigation was used and excellent hemostasis was achieved.  At this point in the procedure was completed.  Robotic instruments were removed under direct visulaization.  The robot was undocked. The adnexa was removed through the right lateral incision after the trocar was  removed. The fascia at this lateral port was closed with 0 Vicryl using a PMI fascial closure device under direct visualization.  The subcuticular tissue of all incisions were closed with 4-0 Vicryl and the skin was closed with 4-0 Monocryl in a subcuticular manner.  Dermabond was applied.    Uterine manipulator was removed. The vagina was swabbed with minimal bleeding noted. Foley catheter was removed.  All sponge, lap and needle counts were correct x  3.   The patient was transferred to the recovery room in stable condition.  Jeral Pinch, MD

## 2022-11-23 NOTE — Progress Notes (Signed)
Unable to void serum pregnancy test sent

## 2022-11-23 NOTE — Progress Notes (Signed)
Brief update  Unfortunately, the patient was not able to urinate this morning for UPT. Given delay in IV insertion and getting blood drawn, hcg was sent late. It was also ordered as routine instead of STAT. This led to delay in surgery start time.  Jeral Pinch MD Gynecologic Oncology

## 2022-11-23 NOTE — Anesthesia Procedure Notes (Signed)
Procedure Name: Intubation Date/Time: 11/23/2022 8:47 AM  Performed by: Genelle Bal, CRNAPre-anesthesia Checklist: Patient identified, Emergency Drugs available, Suction available and Patient being monitored Patient Re-evaluated:Patient Re-evaluated prior to induction Oxygen Delivery Method: Circle system utilized Preoxygenation: Pre-oxygenation with 100% oxygen Induction Type: IV induction Ventilation: Mask ventilation without difficulty Laryngoscope Size: Miller and 2 Grade View: Grade I Tube type: Oral Tube size: 7.0 mm Number of attempts: 1 Airway Equipment and Method: Stylet and Oral airway Placement Confirmation: ETT inserted through vocal cords under direct vision, positive ETCO2 and breath sounds checked- equal and bilateral Secured at: 21 cm Tube secured with: Tape Dental Injury: Teeth and Oropharynx as per pre-operative assessment

## 2022-11-23 NOTE — Anesthesia Postprocedure Evaluation (Signed)
Anesthesia Post Note  Patient: Heather Boone  Procedure(s) Performed: POSSIBLE XI ROBOTIC ASSISTED UNILATERAL SALPINGO OOPHORECTOMY (Right)     Patient location during evaluation: PACU Anesthesia Type: General Level of consciousness: awake and alert Pain management: pain level controlled Vital Signs Assessment: post-procedure vital signs reviewed and stable Respiratory status: spontaneous breathing, nonlabored ventilation and respiratory function stable Cardiovascular status: blood pressure returned to baseline and stable Postop Assessment: no apparent nausea or vomiting Anesthetic complications: no   No notable events documented.  Last Vitals:  Vitals:   11/23/22 1130 11/23/22 1145  BP: (!) 151/74 (!) 141/88  Pulse: 73 77  Resp: 15 13  Temp:    SpO2: 100% 94%    Last Pain:  Vitals:   11/23/22 1145  TempSrc:   PainSc: Asleep                 Lynda Rainwater

## 2022-11-23 NOTE — Anesthesia Preprocedure Evaluation (Signed)
Anesthesia Evaluation  Patient identified by MRN, date of birth, ID band Patient awake    Reviewed: Allergy & Precautions, H&P , NPO status , Patient's Chart, lab work & pertinent test results  Airway Mallampati: II  TM Distance: >3 FB Neck ROM: Full    Dental no notable dental hx.    Pulmonary neg pulmonary ROS   Pulmonary exam normal breath sounds clear to auscultation       Cardiovascular negative cardio ROS Normal cardiovascular exam Rhythm:Regular Rate:Normal     Neuro/Psych    Depression    negative neurological ROS  negative psych ROS   GI/Hepatic Neg liver ROS,GERD  ,,  Endo/Other    Morbid obesity  Renal/GU negative Renal ROS  negative genitourinary   Musculoskeletal negative musculoskeletal ROS (+)    Abdominal  (+) + obese  Peds negative pediatric ROS (+)  Hematology negative hematology ROS (+)   Anesthesia Other Findings   Reproductive/Obstetrics negative OB ROS                             Anesthesia Physical Anesthesia Plan  ASA: 3  Anesthesia Plan: General   Post-op Pain Management: Tylenol PO (pre-op)*, Gabapentin PO (pre-op)* and Dilaudid IV   Induction: Intravenous  PONV Risk Score and Plan: 3 and Ondansetron, Dexamethasone, Midazolam and Treatment may vary due to age or medical condition  Airway Management Planned: Oral ETT  Additional Equipment:   Intra-op Plan:   Post-operative Plan: Extubation in OR  Informed Consent: I have reviewed the patients History and Physical, chart, labs and discussed the procedure including the risks, benefits and alternatives for the proposed anesthesia with the patient or authorized representative who has indicated his/her understanding and acceptance.     Dental advisory given  Plan Discussed with: CRNA  Anesthesia Plan Comments:        Anesthesia Quick Evaluation

## 2022-11-23 NOTE — Discharge Instructions (Addendum)
AFTER SURGERY INSTRUCTIONS   Return to work: 4-6 weeks if applicable   Activity: 1. Be up and out of the bed during the day.  Take a nap if needed.  You may walk up steps but be careful and use the hand rail.  Stair climbing will tire you more than you think, you may need to stop part way and rest.    2. No lifting or straining for 6 weeks over 10 pounds. No pushing, pulling, straining for 6 weeks.   3. No driving for around 1 week(s).  Do not drive if you are taking narcotic pain medicine and make sure that your reaction time has returned.    4. You can shower as soon as the next day after surgery. Shower daily.  Use your regular soap and water (not directly on the incision) and pat your incision(s) dry afterwards; don't rub.  No tub baths or submerging your body in water until cleared by your surgeon. If you have the soap that was given to you by pre-surgical testing that was used before surgery, you do not need to use it afterwards because this can irritate your incisions.    5. No sexual activity and nothing in the vagina for 4 weeks.   6. You may experience a small amount of clear drainage from your incisions, which is normal.  If the drainage persists, increases, or changes color please call the office.   7. Do not use creams, lotions, or ointments such as neosporin on your incisions after surgery until advised by your surgeon because they can cause removal of the dermabond glue on your incisions.     8. You may experience vaginal spotting after surgery or around the 6-8 week mark from surgery when the stitches at the top of the vagina begin to dissolve.  The spotting is normal but if you experience heavy bleeding, call our office.   9. Take Tylenol first for pain if you are able to take these medications and only use narcotic pain medication for severe pain not relieved by the Tylenol.  Monitor your Tylenol intake to a max of 4,000 mg in a 24 hour period.    Diet: 1. Low sodium Heart  Healthy Diet is recommended but you are cleared to resume your normal (before surgery) diet after your procedure.   2. It is safe to use a laxative, such as Miralax or Colace, if you have difficulty moving your bowels. You have been prescribed Sennakot-S to take at bedtime every evening after surgery to keep bowel movements regular and to prevent constipation.     Wound Care: 1. Keep clean and dry.  Shower daily.   Reasons to call the Doctor: Fever - Oral temperature greater than 100.4 degrees Fahrenheit Foul-smelling vaginal discharge Difficulty urinating Nausea and vomiting Increased pain at the site of the incision that is unrelieved with pain medicine. Difficulty breathing with or without chest pain New calf pain especially if only on one side Sudden, continuing increased vaginal bleeding with or without clots.   Contacts: For questions or concerns you should contact:   Dr. Jeral Pinch at [phone]   Joylene John, NP at [phone]   After Hours: call [phone] and have the GYN Oncologist paged/contacted (after 5 pm or on the weekends).   Messages sent via mychart are for non-urgent matters and are not responded to after hours so for urgent needs, please call the after hours number.

## 2022-11-24 ENCOUNTER — Encounter (HOSPITAL_COMMUNITY): Payer: Self-pay | Admitting: Gynecologic Oncology

## 2022-11-24 ENCOUNTER — Encounter: Payer: No Typology Code available for payment source | Admitting: Gynecologic Oncology

## 2022-11-24 ENCOUNTER — Telehealth: Payer: Self-pay | Admitting: Surgery

## 2022-11-24 LAB — SURGICAL PATHOLOGY

## 2022-11-24 NOTE — Telephone Encounter (Signed)
Spoke with Heather Boone this morning. She states she is eating, drinking and urinating well. She has not had a BM yet but is passing gas. She is taking senokot as prescribed and encouraged her to drink plenty of water. She denies fever or chills. Incisions are dry and intact. She rates her pain 8/10 and had just taken an Oxycodone prior to the call. Her pain is controlled with Oxycodone.     Instructed to call office with any fever, chills, purulent drainage, uncontrolled pain or any other questions or concerns. Patient verbalizes understanding.   Pt aware of post op appointments as well as the office number [phone] and after hours number [phone] to call if she has any questions or concerns

## 2022-11-27 LAB — CYTOLOGY - NON PAP

## 2022-11-28 ENCOUNTER — Telehealth: Payer: Self-pay | Admitting: *Deleted

## 2022-11-28 NOTE — Telephone Encounter (Signed)
Per Dr Berline Lopes fax records (op note and photos)  to Dr Binnie Rail office

## 2022-11-30 ENCOUNTER — Inpatient Hospital Stay: Payer: No Typology Code available for payment source | Attending: Gynecologic Oncology | Admitting: Gynecologic Oncology

## 2022-11-30 ENCOUNTER — Encounter: Payer: Self-pay | Admitting: Gynecologic Oncology

## 2022-11-30 DIAGNOSIS — Z90721 Acquired absence of ovaries, unilateral: Secondary | ICD-10-CM

## 2022-11-30 DIAGNOSIS — D259 Leiomyoma of uterus, unspecified: Secondary | ICD-10-CM

## 2022-11-30 DIAGNOSIS — T7840XA Allergy, unspecified, initial encounter: Secondary | ICD-10-CM

## 2022-11-30 DIAGNOSIS — Z7189 Other specified counseling: Secondary | ICD-10-CM

## 2022-11-30 NOTE — Progress Notes (Signed)
Gynecologic Oncology Telehealth Note: Gyn-Onc  I connected with Tacy Learn on 11/30/22 at  4:00 PM EST by telephone and verified that I am speaking with the correct person using two identifiers.  I discussed the limitations, risks, security and privacy concerns of performing an evaluation and management service by telemedicine and the availability of in-person appointments. I also discussed with the patient that there may be a patient responsible charge related to this service. The patient expressed understanding and agreed to proceed.  Other persons participating in the visit and their role in the encounter: none.  Patient's location: home Provider's location: [facility]  Reason for Visit: Follow-up after surgery  Treatment History: 11/23/22: Robotic-assisted right salpingo-oophorectomy  Findings: On EUA, mobile uterus with firm mass posterior and to the right of the uterus. ON intra-abdominal entry, normal upper abdominal survey with adhesions from prior lap band surgery. Lap band noted to be in place. Normal omentum, small and large bowel. No ascites. Uterus 8 cm and normal in appearing. Bilateral tubes somewhat dilated and clubbed. Left ovary normal in appearing. Right ovary elongated with 6 cm solid/smooth mass appearing to be eminating from inferior aspect of the ovary. Only after careful dissection of the tube and ovary (once blood supply to both taken were these structures able to be separated from the solid mass. The mass itself appeared to be attached to the lateral right uterus by a large base. It filled much of the retroperitoneum lateral to the uterus. Given my concern that attempt at excision of the presumed broad ligament fibroid (only apparent not to be ovarian in origin once the ovary was excised) could cause significant bleeding and risks requiring urgent hysterectomy, excision of the mass abandoned. Pictures taken (below).   Interval History: Has abdominal soreness, improving  slowly. Reports baseline bowel and bladder. Versus some chills. No fever, taken her temperature to check. Incisions are red, sore.  Itching.  Endorses pain around the incisions.  Past Medical/Surgical History: Past Medical History:  Diagnosis Date   B12 deficiency    Back pain    Constipation    Fatigue    Heartburn    Obesity    Pre-diabetes    Prediabetes    SOB (shortness of breath) on exertion    resolved (was associated with being sick)   Vitamin D deficiency     Past Surgical History:  Procedure Laterality Date   LAPAROSCOPIC GASTRIC BANDING     dec 2011, [place]   ROBOTIC ASSISTED SALPINGO OOPHERECTOMY Right 11/23/2022   Procedure: POSSIBLE XI ROBOTIC ASSISTED UNILATERAL SALPINGO OOPHORECTOMY;  Surgeon: Lafonda Mosses, MD;  Location: [facility];  Service: Gynecology;  Laterality: Right;    Family History  Problem Relation Age of Onset   Stroke Mother    Hypertension Mother    Hyperlipidemia Mother    Anxiety disorder Mother    Lung cancer Mother    Hypertension Father    Hyperlipidemia Father    Heart disease Father    Obesity Father    Colon cancer Father     Social History   Socioeconomic History   Marital status: Single    Spouse name: Not on file   Number of children: Not on file   Years of education: Not on file   Highest education level: Not on file  Occupational History   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  Tobacco Use   Smoking status: Never   Smokeless tobacco: Never  Vaping Use   Vaping Use:  Never used  Substance and Sexual Activity   Alcohol use: No   Drug use: No   Sexual activity: Yes    Partners: Male    Birth control/protection: None    Comment: 1st intercourse- 53, partners- 71,  Other Topics Concern   Not on file  Social History Narrative   Not on file   Social Determinants of Health   Financial Resource Strain: Not on file  Food Insecurity: Not on file  Transportation Needs: Not on file  Physical  Activity: Not on file  Stress: Not on file  Social Connections: Not on file    Current Medications:  Current Outpatient Medications:    acetaminophen (TYLENOL) 325 MG tablet, Take 2 tablets (650 mg total) by mouth every 6 (six) hours as needed. (Patient taking differently: Take 650 mg by mouth every 6 (six) hours as needed for mild pain.), Disp: 100 tablet, Rfl: 0   buPROPion (WELLBUTRIN SR) 200 MG 12 hr tablet, Take 1 tablet (200 mg total) by mouth 2 (two) times daily., Disp: 60 tablet, Rfl: 0   Cholecalciferol (VITAMIN D3) 1.25 MG (50000 UT) CAPS, Take 1 capsule by mouth once a week. (Patient taking differently: Take 50,000 Units by mouth once a week. Friday), Disp: 4 capsule, Rfl: 0   oxyCODONE (OXY IR/ROXICODONE) 5 MG immediate release tablet, Take 1 tablet (5 mg total) by mouth every 4 (four) hours as needed for severe pain. For AFTER surgery only, do not take and drive, Disp: 15 tablet, Rfl: 0   Semaglutide-Weight Management 1.7 MG/0.75ML SOAJ, Inject 1.7 mg into the skin once a week. (Patient taking differently: Inject 1.7 mg into the skin once a week. Wednesday), Disp: 3 mL, Rfl: 0   senna-docusate (SENOKOT-S) 8.6-50 MG tablet, Take 2 tablets by mouth at bedtime. For AFTER surgery, do not take if having diarrhea, Disp: 30 tablet, Rfl: 0  Review of Symptoms: Pertinent positives as per HPI.  Physical Exam: Deferred given limitations of phone visit.  Laboratory & Radiologic Studies: A. OVARY AND FALLOPIAN TUBE, RIGHT, SALPINGO OOPHORECTOMY:  - TUBO-OVARIAN ADHESIONS WITH ENDOSALPINGOSIS AND FOLLICULAR CYST  - BENIGN FALLOPIAN TUBE  - NEGATIVE FOR ENDOMETRIOSIS OR MALIGNANCY   Assessment & Plan: ADLER ALTON is a 41 y.o. woman status post robotic RSO last week with findings of a large retroperitoneal fibroid.  It sounds like patient has been having a reaction to Dermabond.  I think it is very unlikely that all 4 of her incisions are infected.  She is going to have somebody come  by the office in the next 30 minutes to pick up adhesive remover.  I also recommended that they pick up some low-dose topical steroid cream and that the patient could use Benadryl as needed.  She will be scheduled for an appointment tomorrow for [place] to assess her abdominal incisions.  Otherwise, it sounds like she is doing well postoperatively.  G reveals benign tube and ovary.  I spoke with one of the pathologist to confirm that there was indeed ovarian tissue in the specimen.  I also spoke with the patient's REI, Dr. Rolin Barry, and reviewed findings at the time of surgery.  I discussed the assessment and treatment plan with the patient. The patient was provided with an opportunity to ask questions and all were answered. The patient agreed with the plan and demonstrated an understanding of the instructions.   The patient was advised to call back or see an in-person evaluation if the symptoms worsen or if the  condition fails to improve as anticipated.   10 minutes of total time was spent for this patient encounter, including preparation, phone counseling with the patient and coordination of care, and documentation of the encounter.   Jeral Pinch, MD  Division of Gynecologic Oncology  Department of Obstetrics and Gynecology  [facility]

## 2022-12-01 ENCOUNTER — Inpatient Hospital Stay (HOSPITAL_BASED_OUTPATIENT_CLINIC_OR_DEPARTMENT_OTHER): Payer: No Typology Code available for payment source | Admitting: Gynecologic Oncology

## 2022-12-01 ENCOUNTER — Other Ambulatory Visit: Payer: Self-pay

## 2022-12-01 VITALS — BP 134/63 | HR 89 | Temp 98.6°F | Resp 18 | Wt 255.4 lb

## 2022-12-01 DIAGNOSIS — L231 Allergic contact dermatitis due to adhesives: Secondary | ICD-10-CM

## 2022-12-01 DIAGNOSIS — Z90722 Acquired absence of ovaries, bilateral: Secondary | ICD-10-CM

## 2022-12-01 DIAGNOSIS — D259 Leiomyoma of uterus, unspecified: Secondary | ICD-10-CM

## 2022-12-01 DIAGNOSIS — Z90710 Acquired absence of both cervix and uterus: Secondary | ICD-10-CM

## 2022-12-01 NOTE — Patient Instructions (Signed)
Based on your exam today, it appears you are having an allergic response to the dermabond glue applied to your incisions. Plan to apply hydrocortisone cream to the reddened areas around the incisions twice daily. You can also apply the topical lidocaine in between that time to help numb the area.   You can also take claritin, zyrtec, or benadryl orally as directed on the box to help with itching symptoms.  If your the weekend, you feel like you are not progressing or symptoms worsen, plan to call the after hours number at [phone].

## 2022-12-01 NOTE — Progress Notes (Signed)
Gynecologic Oncology Post-op Symptom Management Visit  Heather Boone is a 41 year old female who presents to the office for evaluation of incisional redness and itching s/p robotic-assisted right salpingo-oophorectomy on 11/23/22 with Dr. Eugene Garnet.   On her post-op phone visit with Dr. Pricilla Holm, she voiced incisional soreness and redness with itching and pain. She used baby oil to try to get glue off. She has taken oral benadryl with little relief. Feeling well otherwise. She had one episode of chills but no documented fever. Tolerating diet with no nausea or emesis.   Exam: Alert, oriented, in no acute distress. Abdominal incisions with surrounding erythema, rash-like bumps. Removed remaining dermabond. Cleansed the incisions and applied topical 2% lidocaine. Pt tolerated this well.   Assessment/Plan: Dermatitis related to dermabond on incisions post-op. Dermabond added to allergy list. Discussed incisional care and medications to use for symptoms. She is advised to call the office if her symptoms do not improve or persist.

## 2022-12-03 ENCOUNTER — Other Ambulatory Visit (INDEPENDENT_AMBULATORY_CARE_PROVIDER_SITE_OTHER): Payer: Self-pay | Admitting: Family Medicine

## 2022-12-03 DIAGNOSIS — E559 Vitamin D deficiency, unspecified: Secondary | ICD-10-CM

## 2022-12-04 ENCOUNTER — Other Ambulatory Visit (INDEPENDENT_AMBULATORY_CARE_PROVIDER_SITE_OTHER): Payer: Self-pay | Admitting: Family Medicine

## 2022-12-04 DIAGNOSIS — F3289 Other specified depressive episodes: Secondary | ICD-10-CM

## 2022-12-05 ENCOUNTER — Other Ambulatory Visit (INDEPENDENT_AMBULATORY_CARE_PROVIDER_SITE_OTHER): Payer: Self-pay | Admitting: Family Medicine

## 2022-12-05 ENCOUNTER — Telehealth: Payer: Self-pay | Admitting: *Deleted

## 2022-12-05 DIAGNOSIS — F3289 Other specified depressive episodes: Secondary | ICD-10-CM

## 2022-12-05 NOTE — Telephone Encounter (Signed)
Fax updated FMLA

## 2022-12-07 ENCOUNTER — Ambulatory Visit (INDEPENDENT_AMBULATORY_CARE_PROVIDER_SITE_OTHER): Payer: No Typology Code available for payment source | Admitting: Family Medicine

## 2022-12-13 ENCOUNTER — Ambulatory Visit (INDEPENDENT_AMBULATORY_CARE_PROVIDER_SITE_OTHER): Payer: No Typology Code available for payment source | Admitting: Family Medicine

## 2022-12-15 ENCOUNTER — Encounter: Payer: Self-pay | Admitting: Gynecologic Oncology

## 2022-12-15 ENCOUNTER — Other Ambulatory Visit: Payer: Self-pay

## 2022-12-15 ENCOUNTER — Inpatient Hospital Stay (HOSPITAL_BASED_OUTPATIENT_CLINIC_OR_DEPARTMENT_OTHER): Payer: No Typology Code available for payment source | Admitting: Gynecologic Oncology

## 2022-12-15 VITALS — BP 128/75 | HR 76 | Temp 98.1°F | Resp 16 | Ht 64.0 in | Wt 250.4 lb

## 2022-12-15 DIAGNOSIS — D259 Leiomyoma of uterus, unspecified: Secondary | ICD-10-CM

## 2022-12-15 DIAGNOSIS — N9489 Other specified conditions associated with female genital organs and menstrual cycle: Secondary | ICD-10-CM

## 2022-12-15 DIAGNOSIS — Z90721 Acquired absence of ovaries, unilateral: Secondary | ICD-10-CM

## 2022-12-15 NOTE — Patient Instructions (Signed)
It was good to see you today.  Your incisions are healing well.  Please let me know if that left-sided pain does not improve by early to mid next week.  I suspect it was either related to your.  Or to pulling/stretching something during recovery.  You can use ibuprofen, heat, and ice.  Please call let me know if you get pregnant before your MRI is scheduled in June.  Otherwise I will call you when I get the results.  Remember, no heavy lifting for 6 weeks after surgery.

## 2022-12-15 NOTE — Progress Notes (Signed)
Gynecologic Oncology Return Clinic Visit  12/15/22  Reason for Visit: Follow-up after surgery  Treatment History: 11/23/22: Robotic-assisted right salpingo-oophorectomy   Interval History: Patient reports overall doing well.  Was not having any significant pain but over the last several days, has had left-sided lower abdominal pain when she walks or moves.  She just finished her first menses after surgery yesterday.  She thinks she may have done some heavier activity prior to onset of this left-sided pain.  She endorses normal bowel and bladder function.  Denies any incisional pain.  Past Medical/Surgical History: Past Medical History:  Diagnosis Date   B12 deficiency    Back pain    Constipation    Fatigue    Heartburn    Obesity    Pre-diabetes    Prediabetes    SOB (shortness of breath) on exertion    resolved (was associated with being sick)   Vitamin D deficiency     Past Surgical History:  Procedure Laterality Date   LAPAROSCOPIC GASTRIC BANDING     dec 2011, [place]   ROBOTIC ASSISTED SALPINGO OOPHERECTOMY Right 11/23/2022   Procedure: POSSIBLE XI ROBOTIC ASSISTED UNILATERAL SALPINGO OOPHORECTOMY;  Surgeon: Lafonda Mosses, MD;  Location: [facility];  Service: Gynecology;  Laterality: Right;    Family History  Problem Relation Age of Onset   Stroke Mother    Hypertension Mother    Hyperlipidemia Mother    Anxiety disorder Mother    Lung cancer Mother    Hypertension Father    Hyperlipidemia Father    Heart disease Father    Obesity Father    Colon cancer Father     Social History   Socioeconomic History   Marital status: Single    Spouse name: Not on file   Number of children: Not on file   Years of education: Not on file   Highest education level: Not on file  Occupational History   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  Tobacco Use   Smoking status: Never   Smokeless tobacco: Never  Vaping Use   Vaping Use: Never used  Substance  and Sexual Activity   Alcohol use: No   Drug use: No   Sexual activity: Yes    Partners: Male    Birth control/protection: None    Comment: 1st intercourse- 82, partners- 47,  Other Topics Concern   Not on file  Social History Narrative   Not on file   Social Determinants of Health   Financial Resource Strain: Not on file  Food Insecurity: Not on file  Transportation Needs: Not on file  Physical Activity: Not on file  Stress: Not on file  Social Connections: Not on file    Current Medications:  Current Outpatient Medications:    acetaminophen (TYLENOL) 325 MG tablet, Take 2 tablets (650 mg total) by mouth every 6 (six) hours as needed. (Patient taking differently: Take 650 mg by mouth every 6 (six) hours as needed for mild pain.), Disp: 100 tablet, Rfl: 0   buPROPion (WELLBUTRIN SR) 200 MG 12 hr tablet, Take 1 tablet (200 mg total) by mouth 2 (two) times daily., Disp: 60 tablet, Rfl: 0   Cholecalciferol (VITAMIN D3) 1.25 MG (50000 UT) CAPS, Take 1 capsule by mouth once a week. (Patient taking differently: Take 50,000 Units by mouth once a week. Friday), Disp: 4 capsule, Rfl: 0   Semaglutide-Weight Management 1.7 MG/0.75ML SOAJ, Inject 1.7 mg into the skin once a week. (Patient taking differently: Inject 1.7  mg into the skin once a week. Wednesday), Disp: 3 mL, Rfl: 0   senna-docusate (SENOKOT-S) 8.6-50 MG tablet, Take 2 tablets by mouth at bedtime. For AFTER surgery, do not take if having diarrhea, Disp: 30 tablet, Rfl: 0  Review of Systems: Denies appetite changes, fevers, chills, fatigue, unexplained weight changes. Denies hearing loss, neck lumps or masses, mouth sores, ringing in ears or voice changes. Denies cough or wheezing.  Denies shortness of breath. Denies chest pain or palpitations. Denies leg swelling. Denies abdominal distention, blood in stools, constipation, diarrhea, nausea, vomiting, or early satiety. Denies pain with intercourse, dysuria, frequency, hematuria or  incontinence. Denies hot flashes, vaginal bleeding or vaginal discharge.   Denies joint pain, back pain or muscle pain/cramps. Denies itching, rash, or wounds. Denies dizziness, headaches, numbness or seizures. Denies swollen lymph nodes or glands, denies easy bruising or bleeding. Denies anxiety, depression, confusion, or decreased concentration.  Physical Exam: BP 128/75 (BP Location: Right Arm, Patient Position: Sitting)   Pulse 76   Temp 98.1 F (36.7 C) (Oral)   Resp 16   Ht '5\' 4"'$  (1.626 m)   Wt 250 lb 6.4 oz (113.6 kg)   SpO2 100%   BMI 42.98 kg/m  General: Alert, oriented, no acute distress. HEENT: Normocephalic, atraumatic, sclera anicteric. Chest: Unlabored breathing on room air. Abdomen: Obese, soft, nontender.  Normoactive bowel sounds.  No masses or hepatosplenomegaly appreciated.  Well-healed incisions.  No evidence of erythema, some minimal moisture noted around the periumbilical incision. Extremities: Grossly normal range of motion.  Warm, well perfused.  No edema bilaterally.  Laboratory & Radiologic Studies: FINAL MICROSCOPIC DIAGNOSIS:  A. OVARY AND FALLOPIAN TUBE, RIGHT, SALPINGO OOPHORECTOMY: - TUBO-OVARIAN ADHESIONS WITH ENDOSALPINGOSIS AND FOLLICULAR CYST - BENIGN FALLOPIAN TUBE - NEGATIVE FOR ENDOMETRIOSIS OR MALIGNANCY   GROSS DESCRIPTION:  Received fresh is "right tube and ovary".  The fallopian tube measures 6.5 cm in length by 0.6 cm in diameter and consists of a dusky purple, intact external surface.  Sectioning reveals an underlying patent lumen without grossly distinct lesions.  Attached to the fallopian tube is a 1.5 x 1.0 x 1.0 cm nodule of pink-tan soft tissue, that may be consistent with ovary weighing 1.5 g.  Sectioning reveals an underlying pink-tan to minimally hemorrhagic cut surface with a minimal amount of grossly identifiable ovarian parenchyma.  Further sectioning of the adnexal vasculature reveals no further distinct  masses/lesions. Representative sections are submitted as follows: A1-A2 fallopian tube A3-A4 nodule/ovarian parenchyma A5 adnexal vasculature/fat   Assessment & Plan: Heather Boone is a 41 y.o. woman with solid adnexal mass that I suspect is a uterine fibroid.  Patient is overall doing well from a postoperative standpoint.  Discussed that I think her left-sided pain is either related to her recent menses or to musculoskeletal pain.  This was her impression as well.  I have asked her to call me early to mid next week if she does not have improvement or resolution of this pain.  We reviewed in detail again findings at the time of surgery.  Patient was shown pictures that I took during surgery and given a copy of these as well as her pathology report.  Discussed that while I cannot definitively rule out that this is an adnexal mass, given normal-appearing adjacent tube and ovary once I had taken the infundibulopelvic ligament let me to believe that the mass itself was attached to the uterus. Given its size, location directly adjacent to the uterus in the paravesical space,  and my suspicion that it is a uterine fibroid, I was worried that its resection would carry significant risk of bleeding that may require hysterectomy.  The patient was understanding of this as well as the fact that I recommend continued monitoring to assure no significant change in size or appearance of this mass.  We will plan for an MRI to follow.  If the patient gets pregnant in the interim, she will call and let me know.  We will plan to repeat imaging after she delivers.  I have spoken with the patient's REI physician and shared details from surgery, pathology.  22 minutes of total time was spent for this patient encounter, including preparation, face-to-face counseling with the patient and coordination of care, and documentation of the encounter.  Jeral Pinch, MD  Division of Gynecologic Oncology  Department of  Obstetrics and Gynecology  [facility]

## 2022-12-25 ENCOUNTER — Encounter (INDEPENDENT_AMBULATORY_CARE_PROVIDER_SITE_OTHER): Payer: Self-pay | Admitting: Family Medicine

## 2022-12-26 ENCOUNTER — Ambulatory Visit (INDEPENDENT_AMBULATORY_CARE_PROVIDER_SITE_OTHER): Payer: No Typology Code available for payment source | Admitting: Family Medicine

## 2023-02-13 ENCOUNTER — Encounter: Payer: Self-pay | Admitting: Obstetrics & Gynecology

## 2023-02-16 ENCOUNTER — Ambulatory Visit (INDEPENDENT_AMBULATORY_CARE_PROVIDER_SITE_OTHER): Payer: No Typology Code available for payment source | Admitting: Obstetrics & Gynecology

## 2023-02-16 ENCOUNTER — Encounter: Payer: Self-pay | Admitting: Obstetrics & Gynecology

## 2023-02-16 VITALS — BP 128/72 | HR 66 | Wt 257.0 lb

## 2023-02-16 DIAGNOSIS — N760 Acute vaginitis: Secondary | ICD-10-CM

## 2023-02-16 DIAGNOSIS — N898 Other specified noninflammatory disorders of vagina: Secondary | ICD-10-CM

## 2023-02-16 LAB — WET PREP FOR TRICH, YEAST, CLUE

## 2023-02-16 MED ORDER — TINIDAZOLE 500 MG PO TABS: 1000.0000 mg | ORAL_TABLET | Freq: Two times a day (BID) | ORAL | 0 refills | Status: AC

## 2023-02-16 NOTE — Progress Notes (Signed)
    Heather Boone 01-04-1981 [id number]        42 y.o.  G1P0010 Accompanied by her partner.  RP: Vaginal discharge with odor x 1 month  HPI: Robotic-assisted right salpingo-oophorectomy on 11/24/2023.  Patho benign.  C/O vaginal d/c with odor x 1 month.  No pelvic pain.  Urine/BMs normal.  No fever.  Patho 11/23/22: FINAL MICROSCOPIC DIAGNOSIS:   A. OVARY AND FALLOPIAN TUBE, RIGHT, SALPINGO OOPHORECTOMY:  - TUBO-OVARIAN ADHESIONS WITH ENDOSALPINGOSIS AND FOLLICULAR CYST  - BENIGN FALLOPIAN TUBE  - NEGATIVE FOR ENDOMETRIOSIS OR MALIGNANCY       OB History  Gravida Para Term Preterm AB Living  1 0     1 0  SAB IAB Ectopic Multiple Live Births    1          # Outcome Date GA Lbr Len/2nd Weight Sex Delivery Anes PTL Lv  1 IAB             Past medical history,surgical history, problem list, medications, allergies, family history and social history were all reviewed and documented in the EPIC chart.   Directed ROS with pertinent positives and negatives documented in the history of present illness/assessment and plan.  Exam:  Vitals:   02/16/23 1350  BP: 128/72  Pulse: 66  SpO2: 100%  Weight: 257 lb (116.6 kg)   General appearance:  Normal  Gynecologic exam: Vulva normal.  Speculum:  Cervix/Vagina normal.  Increased discharge.  Wet prep and SureSwab Advanced plus done.  Bimanual exam: Uterus AV, mobile, normal volume, mobile, NT.  No adnexal mass, NT.  Wet prep:  Clue cells present with Pos odor   Assessment/Plan:  42 y.o. G1P0010   1. Vaginal discharge with odor Robotic-assisted right salpingo-oophorectomy on 11/24/2023.  Patho benign.  C/O vaginal d/c with odor x 1 month.  No pelvic pain.  Urine/BMs normal.  No fever. Wet prep confirmed BV.  Will treat with Tinidazole.  Usage reviewed.  Pending SureSwab advanced Plus. - WET PREP FOR TRICH, YEAST, CLUE - SureSwab Advanced Vaginitis Plus,TMA  Other orders - tinidazole (TINDAMAX) 500 MG tablet; Take 2 tablets  (1,000 mg total) by mouth 2 (two) times daily for 2 days.   [name], 2:03 PM 02/16/2023

## 2023-02-17 LAB — SURESWAB® ADVANCED VAGINITIS PLUS,TMA
C. trachomatis RNA, TMA: NOT DETECTED
CANDIDA SPECIES: NOT DETECTED
Candida glabrata: NOT DETECTED
N. gonorrhoeae RNA, TMA: NOT DETECTED
SURESWAB(R) ADV BACTERIAL VAGINOSIS(BV),TMA: POSITIVE — AB
TRICHOMONAS VAGINALIS (TV),TMA: NOT DETECTED

## 2023-04-10 ENCOUNTER — Encounter: Payer: Self-pay | Admitting: Obstetrics & Gynecology

## 2023-05-18 ENCOUNTER — Other Ambulatory Visit: Payer: Self-pay

## 2023-05-18 ENCOUNTER — Encounter (HOSPITAL_BASED_OUTPATIENT_CLINIC_OR_DEPARTMENT_OTHER): Payer: Self-pay | Admitting: Emergency Medicine

## 2023-05-18 ENCOUNTER — Emergency Department (HOSPITAL_BASED_OUTPATIENT_CLINIC_OR_DEPARTMENT_OTHER): Payer: No Typology Code available for payment source

## 2023-05-18 ENCOUNTER — Other Ambulatory Visit (HOSPITAL_BASED_OUTPATIENT_CLINIC_OR_DEPARTMENT_OTHER): Payer: Self-pay

## 2023-05-18 ENCOUNTER — Emergency Department (HOSPITAL_BASED_OUTPATIENT_CLINIC_OR_DEPARTMENT_OTHER)
Admission: EM | Admit: 2023-05-18 | Discharge: 2023-05-18 | Disposition: A | Discharge status: Home / Self Care | Payer: No Typology Code available for payment source | Attending: Emergency Medicine | Admitting: Emergency Medicine

## 2023-05-18 DIAGNOSIS — R1013 Epigastric pain: Secondary | ICD-10-CM

## 2023-05-18 DIAGNOSIS — K209 Esophagitis, unspecified without bleeding: Secondary | ICD-10-CM | POA: Insufficient documentation

## 2023-05-18 DIAGNOSIS — R109 Unspecified abdominal pain: Secondary | ICD-10-CM | POA: Diagnosis present

## 2023-05-18 LAB — COMPREHENSIVE METABOLIC PANEL
ALT: 14 U/L (ref 0–44)
AST: 17 U/L (ref 15–41)
Albumin: 3.6 g/dL (ref 3.5–5.0)
Alkaline Phosphatase: 72 U/L (ref 38–126)
Anion gap: 8 (ref 5–15)
BUN: 13 mg/dL (ref 6–20)
CO2: 23 mmol/L (ref 22–32)
Calcium: 9.2 mg/dL (ref 8.9–10.3)
Chloride: 103 mmol/L (ref 98–111)
Creatinine, Ser: 0.64 mg/dL (ref 0.44–1.00)
GFR, Estimated: >60 mL/min (ref >60)
Glucose, Bld: 88 mg/dL (ref 70–99)
Potassium: 4 mmol/L (ref 3.5–5.1)
Sodium: 134 mmol/L — ABNORMAL LOW (ref 135–145)
Total Bilirubin: 0.6 mg/dL (ref 0.3–1.2)
Total Protein: 7.4 g/dL (ref 6.5–8.1)

## 2023-05-18 LAB — CBC WITH DIFFERENTIAL/PLATELET
Abs Immature Granulocytes: 0.04 10*3/uL (ref 0.00–0.07)
Basophils Absolute: 0.1 10*3/uL (ref 0.0–0.1)
Basophils Relative: 1 %
Eosinophils Absolute: 0 10*3/uL (ref 0.0–0.5)
Eosinophils Relative: 0 %
HCT: 40.7 % (ref 36.0–46.0)
Hemoglobin: 13.3 g/dL (ref 12.0–15.0)
Immature Granulocytes: 1 %
Lymphocytes Relative: 22 %
Lymphs Abs: 1.7 10*3/uL (ref 0.7–4.0)
MCH: 27.1 pg (ref 26.0–34.0)
MCHC: 32.7 g/dL (ref 30.0–36.0)
MCV: 83.1 fL (ref 80.0–100.0)
Monocytes Absolute: 0.5 10*3/uL (ref 0.1–1.0)
Monocytes Relative: 7 %
Neutro Abs: 5.3 10*3/uL (ref 1.7–7.7)
Neutrophils Relative %: 69 %
Platelets: 361 10*3/uL (ref 150–400)
RBC: 4.9 MIL/uL (ref 3.87–5.11)
RDW: 13.6 % (ref 11.5–15.5)
WBC: 7.7 10*3/uL (ref 4.0–10.5)
nRBC: 0 % (ref 0.0–0.2)

## 2023-05-18 LAB — URINALYSIS, ROUTINE W REFLEX MICROSCOPIC
Bilirubin Urine: NEGATIVE
Glucose, UA: NEGATIVE mg/dL
Hgb urine dipstick: NEGATIVE
Ketones, ur: NEGATIVE mg/dL
Leukocytes,Ua: NEGATIVE
Nitrite: NEGATIVE
Protein, ur: NEGATIVE mg/dL
Specific Gravity, Urine: 1.025 (ref 1.005–1.030)
pH: 6.5 (ref 5.0–8.0)

## 2023-05-18 LAB — PREGNANCY, URINE: Preg Test, Ur: NEGATIVE

## 2023-05-18 LAB — LIPASE, BLOOD: Lipase: 31 U/L (ref 11–51)

## 2023-05-18 MED ORDER — ONDANSETRON 4 MG PO TBDP
4.0000 mg | ORAL_TABLET | Freq: Three times a day (TID) | ORAL | 0 refills | Status: DC | PRN
  Filled 2023-05-18: qty 10, 4d supply, fill #0

## 2023-05-18 MED ORDER — HYDROMORPHONE HCL 1 MG/ML IJ SOLN
0.5000 mg | Freq: Once | INTRAMUSCULAR | Status: AC
  Administered 2023-05-18: 0.5 mg via INTRAVENOUS
  Filled 2023-05-18: qty 1

## 2023-05-18 MED ORDER — METOCLOPRAMIDE HCL 5 MG/ML IJ SOLN
10.0000 mg | Freq: Once | INTRAMUSCULAR | Status: AC
  Administered 2023-05-18: 10 mg via INTRAVENOUS
  Filled 2023-05-18: qty 2

## 2023-05-18 MED ORDER — PANTOPRAZOLE SODIUM 40 MG IV SOLR
40.0000 mg | Freq: Once | INTRAVENOUS | Status: AC
  Administered 2023-05-18: 40 mg via INTRAVENOUS
  Filled 2023-05-18: qty 10

## 2023-05-18 MED ORDER — IOHEXOL 300 MG/ML  SOLN
100.0000 mL | Freq: Once | INTRAMUSCULAR | Status: AC | PRN
  Administered 2023-05-18: 100 mL via INTRAVENOUS

## 2023-05-18 MED ORDER — SODIUM CHLORIDE 0.9 % IV BOLUS
1000.0000 mL | Freq: Once | INTRAVENOUS | Status: AC
  Administered 2023-05-18: 1000 mL via INTRAVENOUS

## 2023-05-18 MED ORDER — FAMOTIDINE 20 MG PO TABS
20.0000 mg | ORAL_TABLET | Freq: Two times a day (BID) | ORAL | 0 refills | Status: AC
  Filled 2023-05-18: qty 14, 7d supply, fill #0

## 2023-05-18 MED ORDER — ONDANSETRON HCL 4 MG/2ML IJ SOLN
4.0000 mg | Freq: Once | INTRAMUSCULAR | Status: AC
  Administered 2023-05-18: 4 mg via INTRAVENOUS
  Filled 2023-05-18: qty 2

## 2023-05-18 MED ORDER — PANTOPRAZOLE SODIUM 20 MG PO TBEC
20.0000 mg | DELAYED_RELEASE_TABLET | Freq: Every day | ORAL | 0 refills | Status: DC
  Filled 2023-05-18: qty 30, 30d supply, fill #0

## 2023-05-18 NOTE — ED Notes (Signed)
Discharge instructions reviewed with patient. Patient questions answered and opportunity for education reviewed. Patient voices understanding of discharge instructions with no further questions. Patient ambulatory with steady gait to lobby.  

## 2023-05-18 NOTE — ED Provider Notes (Signed)
[facility] Provider Note   CSN: [id number] Arrival date & time: 05/18/23  6213     History  Chief Complaint  Patient presents with   Abdominal Pain    Heather Boone is a 42 y.o. female.  Patient with history of surgery for right ovarian mass, history of hernia surgery, lap band -- presents to the emergency department today for evaluation of abdominal pain.  Patient awoke from sleep today with central, sharp abdominal pain that did not radiate.  It was associated with intense nausea but no vomiting.  She denies constipation or diarrhea.  No dysuria, hematuria, increased frequency or urgency.  She has not tried medications prior to arrival.  She denies heavy NSAID or alcohol use.  No sick contacts or fevers.       Home Medications Prior to Admission medications   Medication Sig Start Date End Date Taking? Authorizing Provider  acetaminophen (TYLENOL) 325 MG tablet Take 2 tablets (650 mg total) by mouth every 6 (six) hours as needed. Patient taking differently: Take 650 mg by mouth every 6 (six) hours as needed for mild pain. 04/28/22   Sloan Leiter, DO  buPROPion (WELLBUTRIN SR) 200 MG 12 hr tablet Take 1 tablet (200 mg total) by mouth 2 (two) times daily. 11/09/22   Quillian Quince D, MD  Cholecalciferol (VITAMIN D3) 1.25 MG (50000 UT) CAPS Take 1 capsule by mouth once a week. Patient taking differently: Take 50,000 Units by mouth once a week. Friday 11/09/22   Wilder Glade, MD  Semaglutide-Weight Management 1.7 MG/0.75ML SOAJ Inject 1.7 mg into the skin once a week. Patient taking differently: Inject 1.7 mg into the skin once a week. Wednesday 11/09/22   Wilder Glade, MD      Allergies    Toradol [ketorolac tromethamine], Aleve [naproxen sodium], Motrin [ibuprofen], Other, and Tramadol    Review of Systems   Review of Systems  Physical Exam Updated Vital Signs BP (!) 154/102 (BP Location: Left Arm)   Pulse 91   Temp  97.9 F (36.6 C) (Oral)   Resp 20   Ht 5\' 3"  (1.6 m)   Wt 104.3 kg   LMP 05/04/2023   SpO2 100%   BMI 40.74 kg/m  Physical Exam Vitals and nursing note reviewed.  Constitutional:      General: She is not in acute distress.    Appearance: She is well-developed.  HENT:     Head: Normocephalic and atraumatic.     Right Ear: External ear normal.     Left Ear: External ear normal.     Nose: Nose normal.  Eyes:     Conjunctiva/sclera: Conjunctivae normal.  Cardiovascular:     Rate and Rhythm: Normal rate and regular rhythm.     Heart sounds: No murmur heard. Pulmonary:     Effort: No respiratory distress.     Breath sounds: No wheezing, rhonchi or rales.  Abdominal:     Palpations: Abdomen is soft.     Tenderness: There is abdominal tenderness in the epigastric area and periumbilical area. There is no guarding or rebound. Negative signs include Murphy's sign and McBurney's sign.     Hernia: No hernia is present.  Musculoskeletal:     Cervical back: Normal range of motion and neck supple.     Right lower leg: No edema.     Left lower leg: No edema.  Skin:    General: Skin is warm and dry.  Findings: No rash.  Neurological:     General: No focal deficit present.     Mental Status: She is alert. Mental status is at baseline.     Motor: No weakness.  Psychiatric:        Mood and Affect: Mood normal.     ED Results / Procedures / Treatments   Labs (all labs ordered are listed, but only abnormal results are displayed) Labs Reviewed  COMPREHENSIVE METABOLIC PANEL - Abnormal; Notable for the following components:      Result Value   Sodium 134 (*)    All other components within normal limits  URINALYSIS, ROUTINE W REFLEX MICROSCOPIC - Abnormal; Notable for the following components:   APPearance HAZY (*)    All other components within normal limits  CBC WITH DIFFERENTIAL/PLATELET  LIPASE, BLOOD  PREGNANCY, URINE    EKG None  Radiology CT ABDOMEN PELVIS W  CONTRAST  Result Date: 05/18/2023 CLINICAL DATA:  Abdominal pain since last night.  Nausea. EXAM: CT ABDOMEN AND PELVIS WITH CONTRAST TECHNIQUE: Multidetector CT imaging of the abdomen and pelvis was performed using the standard protocol following bolus administration of intravenous contrast. RADIATION DOSE REDUCTION: This exam was performed according to the departmental dose-optimization program which includes automated exposure control, adjustment of the mA and/or kV according to patient size and/or use of iterative reconstruction technique. CONTRAST:  OMNIPAQUE IOHEXOL 300 MG/ML  SOLN COMPARISON:  08/31/2022 pelvic MRI.  No prior CT. FINDINGS: Lower chest: Clear lung bases. Mild cardiomegaly, without pericardial or pleural effusion. Distal esophageal wall thickening is mild-to-moderate on 05/02. Tiny hiatal hernia. Hepatobiliary: Nonspecific caudate lobe prominence. No focal liver lesion. Normal gallbladder, without biliary ductal dilatation. Pancreas: Normal, without mass or ductal dilatation. Spleen: Normal in size, without focal abnormality. Adrenals/Urinary Tract: Normal adrenal glands. Normal kidneys, without hydronephrosis. Normal urinary bladder. Stomach/Bowel: Lap band. Normal distal stomach. Scattered colonic diverticula. Normal terminal ileum and appendix. Normal small bowel. Vascular/Lymphatic: Normal caliber of the aorta and branch vessels. No abdominopelvic adenopathy. Reproductive: Normal uterus and left adnexa for age. Right ovarian mass measures 7.4 x 4.6 cm on 68/2 versus 7.0 x 4.4 cm when measured in a similar fashion on 08/31/2022. Small volume macroscopic fat is identified superiorly and anteriorly on 65/2 and coronal image 50. Other: No significant free fluid.  No free intraperitoneal air. Musculoskeletal: No acute osseous abnormality. IMPRESSION: 1.  No acute process in the abdomen or pelvis. 2. Lap band with tiny hiatal hernia. Distal esophageal wall thickening suggests esophagitis.  3. Right ovarian mass is similar to minimally enlarged compared to the MRI of 08/31/2022. 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 Electronically Signed   By: Jeronimo Greaves M.D.   On: 05/18/2023 14:53    Procedures Procedures    Medications Ordered in ED Medications  pantoprazole (PROTONIX) injection 40 mg (has no administration in time range)  HYDROmorphone (DILAUDID) injection 0.5 mg (0.5 mg Intravenous Given 05/18/23 1211)  ondansetron (ZOFRAN) injection 4 mg (4 mg Intravenous Given 05/18/23 1211)  sodium chloride 0.9 % bolus 1,000 mL (0 mLs Intravenous Stopped 05/18/23 1340)  iohexol (OMNIPAQUE) 300 MG/ML solution 100 mL (100 mLs Intravenous Contrast Given 05/18/23 1412)  metoCLOPramide (REGLAN) injection 10 mg (10 mg Intravenous Given 05/18/23 1429)    ED Course/ Medical Decision Making/ A&P    Patient seen and examined. History obtained directly from patient.   Labs/EKG: Ordered CBC, CMP, lipase, UA, pregnancy  Imaging: Will consider CT imaging of the abdomen pelvis  Medications/Fluids: Ordered: IV Dilaudid, IV Zofran  Most recent vital signs reviewed and are as follows: BP (!) 154/102 (BP Location: Left Arm)   Pulse 91   Temp 97.9 F (36.6 C) (Oral)   Resp 20   Ht 5\' 3"  (1.6 m)   Wt 104.3 kg   LMP 05/04/2023   SpO2 100%   BMI 40.74 kg/m   Initial impression: Central abdominal pain and tenderness  3:08 PM Reassessment performed. Patient appears stable.  She required another dose of nausea medicine and Reglan was given with improvement.  Labs personally reviewed and interpreted including: CBC with differential was normal; CMP unremarkable; lipase normal; UA without compelling signs of infection; pregnancy negative.  Imaging personally visualized and interpreted including: CT abdomen pelvis, agree with findings of esophagitis and ovarian mass of which the patient is already aware.  Reviewed pertinent lab work and imaging with patient at bedside.  Questions answered.   Most current vital signs reviewed and are as follows: BP 139/77   Pulse 85   Temp 97.8 F (36.6 C)   Resp 18   Ht 5\' 3"  (1.6 m)   Wt 104.3 kg   LMP 05/04/2023 Comment: neg upreg er 05/18/23  SpO2 100%   BMI 40.74 kg/m   Plan: Discharge to home.   Prescriptions written for: Zofran, Protonix, Pepcid  Other home care instructions discussed: Parke Simmers diet, small meals if food makes symptoms worse.  ED return instructions discussed: The patient was urged to return to the Emergency Department immediately with worsening of current symptoms, worsening abdominal pain, persistent vomiting, blood noted in stools, fever, or any other concerns. The patient verbalized understanding.   Follow-up instructions discussed: Patient encouraged to follow-up with their PCP in 7 days.                               Medical Decision Making Amount and/or Complexity of Data Reviewed Labs: ordered. Radiology: ordered.  Risk Prescription drug management.   For this patient's complaint of abdominal pain, the following conditions were considered on the differential diagnosis: gastritis/PUD, enteritis/duodenitis, appendicitis, cholelithiasis/cholecystitis, cholangitis, pancreatitis, ruptured viscus, colitis, diverticulitis, small/large bowel obstruction, proctitis, cystitis, pyelonephritis, ureteral colic, aortic dissection, aortic aneurysm. In women, ectopic pregnancy, pelvic inflammatory disease, ovarian cysts, and tubo-ovarian abscess were also considered. Atypical chest etiologies were also considered including ACS, PE, and pneumonia.  CT is most consistent with esophagitis which is fairly consistent with the patient's symptoms.  She will be started on acid suppression and given medication for nausea control.  Patient also has a previously known pelvic mass for which she is having followed.  She has an upcoming MRI of the pelvis next month.  I let her know that the mass is minimally  enlarged from the end of last year.  The patient's vital signs, pertinent lab work and imaging were reviewed and interpreted as discussed in the ED course. Hospitalization was considered for further testing, treatments, or serial exams/observation. However as patient is well-appearing, has a stable exam, and reassuring studies today, I do not feel that they warrant admission at this time. This plan was discussed with the patient who verbalizes agreement and comfort with this plan and seems reliable and able to return to the Emergency Department with worsening or changing symptoms.           Final Clinical Impression(s) / ED Diagnoses Final diagnoses:  Epigastric abdominal pain  Esophagitis    Rx / DC  Orders ED Discharge Orders          Ordered    pantoprazole (PROTONIX) 20 MG tablet  Daily        05/18/23 1506    famotidine (PEPCID) 20 MG tablet  2 times daily        05/18/23 1506    ondansetron (ZOFRAN-ODT) 4 MG disintegrating tablet  Every 8 hours PRN        05/18/23 1506              Renne Crigler, PA-C 05/18/23 1512    Horton, Clabe Seal, DO 05/19/23 [phone]

## 2023-05-18 NOTE — Discharge Instructions (Signed)
Please read and follow all provided instructions.  Your diagnoses today include:  1. Epigastric abdominal pain   2. Esophagitis     Tests performed today include: Blood cell counts and platelets Kidney and liver function tests Pancreas function test (called lipase) Urine test to look for infection A blood or urine test for pregnancy (women only) CT scan of your abdomen pelvis shows suggestion of esophagitis, no acute or surgical findings.  You do have an ovarian mass which is minimally bigger than from ultrasound performed last year. Vital signs. See below for your results today.   Medications prescribed:  Pantoprazole - stomach acid reducer  Pepcid (famotidine) - antihistamine  You can find this medication over-the-counter.   DO NOT exceed:  20mg  Pepcid every 12 hours  Zofran (ondansetron) - for nausea and vomiting  Take any prescribed medications only as directed.  Home care instructions:  Follow any educational materials contained in this packet.  Follow-up instructions: Please follow-up with your primary care provider in the next 7 days for further evaluation of your symptoms.    Return instructions:  SEEK IMMEDIATE MEDICAL ATTENTION IF: The pain does not go away or becomes severe  A temperature above 101F develops  Repeated vomiting occurs (multiple episodes)  The pain becomes localized to portions of the abdomen. The right side could possibly be appendicitis. In an adult, the left lower portion of the abdomen could be colitis or diverticulitis.  Blood is being passed in stools or vomit (bright red or black tarry stools)  You develop chest pain, difficulty breathing, dizziness or fainting, or become confused, poorly responsive, or inconsolable (young children) If you have any other emergent concerns regarding your health  Additional Information: Abdominal (belly) pain can be caused by many things. Your caregiver performed an examination and possibly ordered  blood/urine tests and imaging (CT scan, x-rays, ultrasound). Many cases can be observed and treated at home after initial evaluation in the emergency department. Even though you are being discharged home, abdominal pain can be unpredictable. Therefore, you need a repeated exam if your pain does not resolve, returns, or worsens. Most patients with abdominal pain don't have to be admitted to the hospital or have surgery, but serious problems like appendicitis and gallbladder attacks can start out as nonspecific pain. Many abdominal conditions cannot be diagnosed in one visit, so follow-up evaluations are very important.  Your vital signs today were: BP 139/77   Pulse 85   Temp 97.8 F (36.6 C)   Resp 18   Ht 5\' 3"  (1.6 m)   Wt 104.3 kg   LMP 05/04/2023 Comment: neg upreg er 05/18/23  SpO2 100%   BMI 40.74 kg/m  If your blood pressure (bp) was elevated above 135/85 this visit, please have this repeated by your doctor within one month. --------------

## 2023-05-18 NOTE — ED Triage Notes (Signed)
Abdominal pain since last night, worse in the night.  Some Nausea.  No diarrhea.  No known fever.

## 2023-06-09 ENCOUNTER — Ambulatory Visit (HOSPITAL_COMMUNITY)
Admission: RE | Admit: 2023-06-09 | Discharge: 2023-06-09 | Disposition: A | Discharge status: Home / Self Care | Payer: No Typology Code available for payment source | Source: Ambulatory Visit | Attending: Gynecologic Oncology | Admitting: Gynecologic Oncology

## 2023-06-09 DIAGNOSIS — D259 Leiomyoma of uterus, unspecified: Secondary | ICD-10-CM | POA: Diagnosis not present

## 2023-06-09 MED ORDER — GADOBUTROL 1 MMOL/ML IV SOLN
10.0000 mL | Freq: Once | INTRAVENOUS | Status: AC | PRN
  Administered 2023-06-09: 10 mL via INTRAVENOUS

## 2023-06-13 ENCOUNTER — Encounter: Payer: Self-pay | Admitting: Obstetrics & Gynecology

## 2023-06-19 ENCOUNTER — Encounter: Payer: Self-pay | Admitting: Gynecologic Oncology

## 2023-06-21 ENCOUNTER — Encounter: Payer: Self-pay | Admitting: Gynecologic Oncology

## 2023-06-21 ENCOUNTER — Inpatient Hospital Stay: Payer: No Typology Code available for payment source | Attending: Gynecologic Oncology | Admitting: Gynecologic Oncology

## 2023-06-21 VITALS — BP 139/88 | HR 75 | Temp 97.5°F | Ht 63.78 in | Wt 265.8 lb

## 2023-06-21 DIAGNOSIS — Z90721 Acquired absence of ovaries, unilateral: Secondary | ICD-10-CM | POA: Diagnosis not present

## 2023-06-21 DIAGNOSIS — Z7189 Other specified counseling: Secondary | ICD-10-CM | POA: Diagnosis not present

## 2023-06-21 DIAGNOSIS — N9489 Other specified conditions associated with female genital organs and menstrual cycle: Secondary | ICD-10-CM

## 2023-06-21 DIAGNOSIS — D259 Leiomyoma of uterus, unspecified: Secondary | ICD-10-CM

## 2023-06-21 DIAGNOSIS — N979 Female infertility, unspecified: Secondary | ICD-10-CM | POA: Diagnosis not present

## 2023-06-21 NOTE — Patient Instructions (Signed)
It was good to see you today.  Please let me know if you end up being seen at [facility] or [facility].  We will help coordinate getting all of your records there.    I will speak with you in several months for a phone visit.  Please call me if you develop any new symptoms or have any questions or concerns that come up before then.

## 2023-06-21 NOTE — Progress Notes (Signed)
Gynecologic Oncology Return Clinic Visit  06/21/23  Reason for Visit: follow-up  Treatment History: Patient was initially seen for an annual exam in June of this year.  This was shortly after she skipped a menstrual cycle in June.  She normally has regular menses that last for approximately 6 days.  Given this irregular menses, patient had some blood work done (TSH and prolactin) and a pelvic ultrasound was ordered.   Pelvic ultrasound exam on 08/17/2022: Anteverted uterus.  Endometrial lining secretory in appearance, measuring 7.2 mm without any obvious mass or thickening.  Echogenic vascular mass seen at the right lateral aspect of the uterus measuring 7.2 x 3.5 x 5 cm.  Difficult to determine uterine versus ovarian origin.  Left ovary is slightly enlarged with positive perfusion.  Simple avascular dominant follicle seen in the left ovary.  Right ovary is not definitively seen.   08/31/2022: Pelvic MRI shows uterus measures 7.6 x 3.3 x 4 cm.  No definitive fibroids noted.  Cervix and vagina are unremarkable.  Within the right ovary, no normal right ovarian tissue seen.  A solid mass with diffuse heterogenous T2 hyperintensity and contrast-enhancement is seen abutting the right lateral uterine wall.  This shows no evidence of internal fat or signal intensity of the fibroid.  It measures 6.9 x 4.4 cm and is suspicious for a solid ovarian neoplasm.  Left ovary measures approximately 4 cm with a benign appearing ovarian cyst, most likely physiologic.  No ascites or adenopathy.  11/23/22: Robotic-assisted right salpingo-oophorectomy  FINAL MICROSCOPIC DIAGNOSIS:  A. OVARY AND FALLOPIAN TUBE, RIGHT, SALPINGO OOPHORECTOMY: - TUBO-OVARIAN ADHESIONS WITH ENDOSALPINGOSIS AND FOLLICULAR CYST - BENIGN FALLOPIAN TUBE - NEGATIVE FOR ENDOMETRIOSIS OR MALIGNANCY   Interval History: The patient reports overall doing well.  She continues to have regular heavy menses, denies any intermenstrual bleeding.  Has some  intermittent left-sided back pain, denies any pelvic or abdominal pain.  Met with REI again since her last visit with me, is interested in finding another practice.  Reports baseline bowel bladder function.  She had been on Wegovy, unfortunately developed significant nausea and had to stop this.    Past Medical/Surgical History: Past Medical History:  Diagnosis Date   B12 deficiency    Back pain    Constipation    Fatigue    Heartburn    Obesity    Pre-diabetes    Prediabetes    SOB (shortness of breath) on exertion    resolved (was associated with being sick)   Vitamin D deficiency     Past Surgical History:  Procedure Laterality Date   LAPAROSCOPIC GASTRIC BANDING     dec 2011, [facility]   ROBOTIC ASSISTED SALPINGO OOPHERECTOMY Right 11/23/2022   Procedure: POSSIBLE XI ROBOTIC ASSISTED UNILATERAL SALPINGO OOPHORECTOMY;  Surgeon: Carver Fila, MD;  Location: [facility];  Service: Gynecology;  Laterality: Right;    Family History  Problem Relation Age of Onset   Stroke Mother    Hypertension Mother    Hyperlipidemia Mother    Anxiety disorder Mother    Lung cancer Mother    Hypertension Father    Hyperlipidemia Father    Heart disease Father    Obesity Father    Colon cancer Father     Social History   Socioeconomic History   Marital status: Single    Spouse name: Not on file   Number of children: Not on file   Years of education: Not on file   Highest education level: Not  on file  Occupational History   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  Tobacco Use   Smoking status: Never   Smokeless tobacco: Never  Vaping Use   Vaping Use: Never used  Substance and Sexual Activity   Alcohol use: No   Drug use: No   Sexual activity: Yes    Partners: Male    Birth control/protection: None    Comment: 1st intercourse- 59, partners- 6,  Other Topics Concern   Not on file  Social History Narrative   Not on file   Social Determinants of Health    Financial Resource Strain: Not on file  Food Insecurity: Not on file  Transportation Needs: Not on file  Physical Activity: Not on file  Stress: Not on file  Social Connections: Not on file    Current Medications:  Current Outpatient Medications:    acetaminophen (TYLENOL) 325 MG tablet, Take 2 tablets (650 mg total) by mouth every 6 (six) hours as needed. (Patient taking differently: Take 650 mg by mouth every 6 (six) hours as needed for mild pain.), Disp: 100 tablet, Rfl: 0   buPROPion (WELLBUTRIN SR) 200 MG 12 hr tablet, Take 1 tablet (200 mg total) by mouth 2 (two) times daily., Disp: 60 tablet, Rfl: 0   Cholecalciferol (VITAMIN D3) 1.25 MG (50000 UT) CAPS, Take 1 capsule by mouth once a week. (Patient taking differently: Take 50,000 Units by mouth once a week. Friday), Disp: 4 capsule, Rfl: 0   famotidine (PEPCID) 20 MG tablet, Take 1 tablet (20 mg total) by mouth 2 (two) times daily. (Patient not taking: Reported on 06/19/2023), Disp: 14 tablet, Rfl: 0   ondansetron (ZOFRAN-ODT) 4 MG disintegrating tablet, Take 1 tablet (4 mg total) by mouth every 8 (eight) hours as needed for nausea or vomiting. (Patient not taking: Reported on 06/19/2023), Disp: 10 tablet, Rfl: 0   pantoprazole (PROTONIX) 20 MG tablet, Take 1 tablet (20 mg total) by mouth daily. (Patient not taking: Reported on 06/19/2023), Disp: 30 tablet, Rfl: 0   Semaglutide-Weight Management 1.7 MG/0.75ML SOAJ, Inject 1.7 mg into the skin once a week. (Patient not taking: Reported on 06/19/2023), Disp: 3 mL, Rfl: 0  Review of Systems: Denies appetite changes, fevers, chills, fatigue, unexplained weight changes. Denies hearing loss, neck lumps or masses, mouth sores, ringing in ears or voice changes. Denies cough or wheezing.  Denies shortness of breath. Denies chest pain or palpitations. Denies leg swelling. Denies abdominal distention, pain, blood in stools, constipation, diarrhea, nausea, vomiting, or early satiety. Denies pain  with intercourse, dysuria, frequency, hematuria or incontinence. Denies hot flashes, pelvic pain, vaginal bleeding or vaginal discharge.   Denies joint pain, back pain or muscle pain/cramps. Denies itching, rash, or wounds. Denies dizziness, headaches, numbness or seizures. Denies swollen lymph nodes or glands, denies easy bruising or bleeding. Denies anxiety, depression, confusion, or decreased concentration.  Physical Exam: BP 139/88 (BP Location: Right Wrist, Patient Position: Sitting)   Pulse 75   Temp (!) 97.5 F (36.4 C)   Ht 5' 3.78" (1.62 m)   Wt 265 lb 12.8 oz (120.6 kg)   SpO2 100%   BMI 45.94 kg/m  General: Alert, oriented, no acute distress. HEENT: Atraumatic, normocephalic, sclera anicteric.  Laboratory & Radiologic Studies: MRI pelvis 6/15: 1. No significant change in a large, contrast enhancing, macroscopic fat containing mass centered in the right adnexa measuring 6.8 x 4.2 cm. This appears to be inseparable from the closely adjacent right uterine body and fundus, and given surgical  report of findings most consistent with a fibroid, this may reflect a fat containing uterine lipoleiomyoma. 2. Normal right ovary is not clearly visualized.  Normal left ovary. 3. Interval resolution of a previously reported functional left ovarian cyst.  Assessment & Plan: Heather Boone is a 42 y.o. woman with solid adnexal mass that I suspect is a uterine fibroid.   The patient is overall doing well and continues to remain asymptomatic.  Discussed recent MRI which shows stable size and appearance of the right pelvic mass.  Discussed again that this may be a fibroid or may be ovarian.  Based on findings at the time of surgery, I strongly favor that it is a fibroid.  Both surgically and on imaging, it looks inseparable from the right aspect of the uterus.  Because of this, decision was made at the time of surgery not to attempt resection of this mass given my concern that it could cause  significant uterine bleeding requiring hysterectomy.  The patient is interested in exploring different options from an REI standpoint.  We discussed 2 options today including [facility] as well as [facility].  Patient was given information and contact numbers for both clinics.  I have asked her to let me know which clinic she ends up using so that we can help coordinate getting all of her health records there.  From a follow-up standpoint, the patient would like to do a telephone visit in 3 months.  We plan to repeat imaging in 6-9 months depending on where the patient is with her fertility journey.  22 minutes of total time was spent for this patient encounter, including preparation, face-to-face counseling with the patient and coordination of care, and documentation of the encounter.  Eugene Garnet, MD  Division of Gynecologic Oncology  Department of Obstetrics and Gynecology  [facility]

## 2023-07-26 ENCOUNTER — Encounter (INDEPENDENT_AMBULATORY_CARE_PROVIDER_SITE_OTHER): Payer: Self-pay | Admitting: Family Medicine

## 2023-08-09 ENCOUNTER — Encounter: Payer: Self-pay | Admitting: Gynecologic Oncology

## 2023-08-20 ENCOUNTER — Telehealth (INDEPENDENT_AMBULATORY_CARE_PROVIDER_SITE_OTHER): Payer: Self-pay

## 2023-08-20 NOTE — Progress Notes (Unsigned)
.smr  Office: [phone]  /  Fax: [phone]  WEIGHT SUMMARY AND BIOMETRICS  Vitals Temp: 97.9 F (36.6 C) BP: 116/78 Pulse Rate: 74 SpO2: 97 %   Anthropometric Measurements Height: 5\' 2"  (1.575 m) (rechecked height today) Weight: 263 lb (119.3 kg) BMI (Calculated): 48.09 Weight at Last Visit: 253 lb Weight Lost Since Last Visit: 0 Weight Gained Since Last Visit: 10 lb Starting Weight: 257 lb Total Weight Loss (lbs): 0 lb (0 kg) Peak Weight: 320 lb   Body Composition  Body Fat %: 53.4 % Fat Mass (lbs): 140.8 lbs Muscle Mass (lbs): 116.8 lbs Total Body Water (lbs): 89.4 lbs Visceral Fat Rating : 17   Other Clinical Data RMR: 1872 Fasting: yes Labs: yes Today's Visit #: 9 Starting Date: 01/03/22     HPI  Chief Complaint: OBESITY  [facility] is here to discuss her progress with her obesity treatment plan. She is on the keeping a food journal and adhering to recommended goals of 1200-1500 calories and 85+ grams of protein and states she is following her eating plan approximately 0 % of the time. She states she is exercising walking 40 minutes 7 times per week. Discussed the use of AI scribe software for clinical note transcription with the patient, who gave verbal consent to proceed.  History of Present Illness        The patient, a 42 year old female with a history of obesity, prediabetes, vitamin D deficiency, fatigue, and status post gastric banding, presents for a follow-up visit. She was last seen in November 2023 and is now ready to get back on track with her nutrition plan. She reports that her school schedule and personal life events, including the loss of her mother, have interfered with her adherence to her treatment plan. 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  She has noticed increased breathlessness and shoulder pain due to standing for long periods during her clinicals. She has a goal to lose weight before she  graduates. She has also been on [facility] for weight loss but had to stop due to severe nausea and vomiting. She is considering starting on a lower dose or trying a different medication.   Interval History:  Last in office visit 11/09/2022 with Dr. Dalbert [facility].  PCP- :[facility].  Since last office visit she up 10 lbs.  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 Reports last general surgery follow up was at [facility] here in [place] with she thinks all the fluid removed from the band at that time.   She has been working with [facility] and is looking to eventually undergo IVF treatments once she has successfully lost some weight.   Pharmacotherapy: Wegovy for weight loss.- Stopped [id number] for severe N/V.      Metformin for prediabetes.  Indirect calorimetry was repeated today and showed an REE of 1872 which is lower than predicted and is 187 calories lower than the previous REE of 2059. We discussed changing to a Category 2 nutrition plan with goal of 1200 calories/85 grams of protein daily.   Fasting labs were obtained today.  She was informed we would discuss her lab results at her next visit unless there is a critical issue that needs to be addressed sooner. She agreed to  keep her next visit at the agreed upon time to discuss these results.    TREATMENT PLAN FOR OBESITY:  Recommended Dietary Goals  [facility] is currently in the action stage of change. As such, her goal is to continue weight management plan. She has agreed to the Category 2 Plan.  Behavioral Intervention  We discussed the following Behavioral Modification Strategies today: increasing lean protein intake, decreasing  simple carbohydrates , increasing vegetables, increasing lower glycemic fruits, avoiding skipping meals, increasing water intake, decreasing eating out or consumption of processed foods, and making healthy choices when eating convenient foods, work on managing stress, creating time for self-care and relaxation measures, continue to practice mindfulness when eating, and planning for success.  Additional resources provided today: NA  Recommended Physical Activity Goals  [name] has been advised to work up to 150 minutes of moderate intensity aerobic activity a week and strengthening exercises 2-3 times per week for cardiovascular health, weight loss maintenance and preservation of muscle mass.   She has agreed to Continue current level of physical activity  and Think about ways to increase daily physical activity and overcoming barriers to exercise   Pharmacotherapy We discussed various medication options to help [facility] with her weight loss efforts and we both agreed to resume metformin for primary indication of prediabetes, and Wellbutrin for emotional eating/craving. .    Return in about 4 weeks (around 09/18/2023).Marland Kitchen She was informed of the importance of frequent follow up visits to maximize her success with intensive lifestyle modifications for her multiple health conditions.  PHYSICAL EXAM:  Blood pressure 116/78, pulse 74, temperature 97.9 F (36.6 C), height 5\' 2"  (1.575 m), weight 263 lb (119.3 kg), SpO2 97%. Body mass index is 48.1 kg/m.  General: She is overweight, cooperative, alert, well developed, and in no acute distress. PSYCH: Has normal mood, affect and thought process.  Cardiovascular: HR 70's , BP 116/78  Lungs: Normal breathing effort, no conversational dyspnea. Neuro: no focal deficits  DIAGNOSTIC DATA REVIEWED:  BMET    Component Value Date/Time   NA 134 (L) 05/18/2023 1216   NA 139 11/09/2022 0912   K 4.0 05/18/2023 1216   CL 103 05/18/2023 1216   CO2 23  05/18/2023 1216   GLUCOSE 88 05/18/2023 1216   BUN 13 05/18/2023 1216   BUN 13 11/09/2022 0912   CREATININE 0.64 05/18/2023 1216   CALCIUM 9.2 05/18/2023 1216   GFRNONAA >60 05/18/2023 1216   GFRAA 130 03/10/2020 1146   Lab Results  Component Value Date   HGBA1C 5.2 11/10/2022   HGBA1C 5.5 07/13/2017   Lab Results  Component Value Date   INSULIN 20.0 11/09/2022   INSULIN 11.0 07/13/2017   Lab Results  Component Value Date   TSH 1.82 06/19/2022   CBC    Component Value Date/Time   WBC 7.7 05/18/2023 1216   RBC 4.90 05/18/2023 1216   HGB 13.3 05/18/2023 1216   HGB 13.1 01/03/2022 1153   HCT 40.7 05/18/2023 1216   HCT 40.5 01/03/2022 1153   PLT 361 05/18/2023 1216   PLT 373 01/03/2022 1153   MCV 83.1 05/18/2023 1216   MCV 82 01/03/2022 1153   MCH 27.1 05/18/2023 1216   MCHC 32.7 05/18/2023 1216   RDW 13.6 05/18/2023 1216   RDW 13.3 01/03/2022 1153   Iron Studies No results found for: "IRON", "TIBC", "FERRITIN", "IRONPCTSAT" Lipid Panel     Component Value Date/Time   CHOL 161 01/03/2022 1153   TRIG 82 01/03/2022 1153  HDL 64 01/03/2022 1153   CHOLHDL 2.5 01/03/2022 1153   LDLCALC 82 01/03/2022 1153   Hepatic Function Panel     Component Value Date/Time   PROT 7.4 05/18/2023 1216   PROT 6.8 11/09/2022 0912   ALBUMIN 3.6 05/18/2023 1216   ALBUMIN 4.0 11/09/2022 0912   AST 17 05/18/2023 1216   ALT 14 05/18/2023 1216   ALKPHOS 72 05/18/2023 1216   BILITOT 0.6 05/18/2023 1216   BILITOT 0.5 11/09/2022 0912   BILIDIR 0.2 01/13/2010 0205   IBILI 0.8 01/13/2010 0205      Component Value Date/Time   TSH 1.82 06/19/2022 1409   Nutritional Lab Results  Component Value Date   VD25OH 23.3 (L) 11/09/2022   VD25OH 27.5 (L) 01/03/2022   VD25OH 22.4 (L) 03/10/2020    ASSOCIATED CONDITIONS ADDRESSED TODAY  ASSESSMENT AND PLAN  Problem List Items Addressed This Visit     Vitamin D deficiency   Relevant Medications   Cholecalciferol (VITAMIN D3) 1.25 MG  (50000 UT) CAPS   Other Relevant Orders   VITAMIN D 25 Hydroxy (Vit-D Deficiency, Fractures)   Prediabetes - Primary   Relevant Medications   metFORMIN (GLUCOPHAGE) 500 MG tablet   Other Relevant Orders   CMP14+EGFR   Hemoglobin A1c   Insulin, random   Lipid Panel With LDL/HDL Ratio   Status post gastric banding   Depression   Relevant Medications   buPROPion (WELLBUTRIN SR) 150 MG 12 hr tablet   Other fatigue   Relevant Orders   Vitamin B12   CBC with Differential/Platelet   TSH   Obesity (HCC)- Start BMI 45.53 Date 01/03/2022   Relevant Medications   metFORMIN (GLUCOPHAGE) 500 MG tablet   BMI 45.0-49.9, adult (HCC) Current BMI 48.2   Relevant Medications   metFORMIN (GLUCOPHAGE) 500 MG tablet  Obesity Patient has a history of gastric banding and has been off track with her weight management due to life events. She has a goal to lose weight before graduation and before starting IVF treatments. Indirect calorimetry shows a decrease in REE and nutrition plan was changed to Category 2 to promote gradual weight loss. Synopsis shows a decrease in muscle mass and an increase in adipose tissue.  -Start Category 2 plan with 1200-1300 calories and 85 grams of protein daily. -Resume Metformin 500mg  daily to help suppress hunger. -Resume Wellbutrin 150mg  daily to help with cravings and emotional eating. -Check labs including insulin level, A1c, and chemistry today  Vitamin D deficiency History of deficiency, importance of maintaining adequate levels for overall health. -Resume Vitamin D supplementation. Recheck vitamin D level today and adjust supplementation accordingly.   Prediabetes History of prediabetes, importance of weight management and diet in controlling blood glucose levels. -Check A1c and insulin levels. Resume metformin 500 mg daily.   Fatigue Likely related to obesity and decreased physical fitness. -Encourage continued physical activity such as walking. Recheck labs  B 12, TSH, CBC and vitamin D levels today.   Status post gastric banding Band has been completely deflated for about 2 years, contributing to increased hunger and weight gain. -Consider follow-up with bariatric surgery team for evaluation and management.  Emotional eating: Patient reports felt wellbutrin was very helpful in the past. Will resume at lower dose and titrate accordingly for cravings/emotional eating behaviors.  ATTESTASTION STATEMENTS:  Reviewed by clinician on day of visit: allergies, medications, problem list, medical history, surgical history, family history, social history, and previous encounter notes.   I have personally spent 48 minutes total  time today in preparation, patient care, nutritional counseling and documentation for this visit, including the following: review of clinical lab tests; review of medical tests/procedures/services.      Kimi Kroft, PA-C

## 2023-08-20 NOTE — Telephone Encounter (Signed)
Call to patient, left a message on her voicemail asking her to come in at 0715 and to fast for an IC.  Requested a call back to let us know that she received our message.

## 2023-08-21 ENCOUNTER — Ambulatory Visit (INDEPENDENT_AMBULATORY_CARE_PROVIDER_SITE_OTHER): Payer: No Typology Code available for payment source | Admitting: Physician Assistant

## 2023-08-21 ENCOUNTER — Encounter (INDEPENDENT_AMBULATORY_CARE_PROVIDER_SITE_OTHER): Payer: Self-pay | Admitting: Physician Assistant

## 2023-08-21 VITALS — BP 116/78 | HR 74 | Temp 97.9°F | Ht 62.0 in | Wt 263.0 lb

## 2023-08-21 DIAGNOSIS — R5383 Other fatigue: Secondary | ICD-10-CM | POA: Diagnosis not present

## 2023-08-21 DIAGNOSIS — E559 Vitamin D deficiency, unspecified: Secondary | ICD-10-CM | POA: Diagnosis not present

## 2023-08-21 DIAGNOSIS — Z6841 Body Mass Index (BMI) 40.0 and over, adult: Secondary | ICD-10-CM

## 2023-08-21 DIAGNOSIS — F3289 Other specified depressive episodes: Secondary | ICD-10-CM

## 2023-08-21 DIAGNOSIS — R7303 Prediabetes: Secondary | ICD-10-CM | POA: Diagnosis not present

## 2023-08-21 DIAGNOSIS — R400 Somnolence: Secondary | ICD-10-CM

## 2023-08-21 DIAGNOSIS — E669 Obesity, unspecified: Secondary | ICD-10-CM

## 2023-08-21 DIAGNOSIS — Z9884 Bariatric surgery status: Secondary | ICD-10-CM

## 2023-08-21 DIAGNOSIS — Z0289 Encounter for other administrative examinations: Secondary | ICD-10-CM

## 2023-08-21 MED ORDER — VITAMIN D3 1.25 MG (50000 UT) PO CAPS
1.0000 | ORAL_CAPSULE | ORAL | 0 refills | Status: DC
Start: 2023-08-21 — End: 2023-10-23

## 2023-08-21 MED ORDER — METFORMIN HCL 500 MG PO TABS
500.0000 mg | ORAL_TABLET | Freq: Every day | ORAL | 0 refills | Status: DC
Start: 2023-08-21 — End: 2024-08-04

## 2023-08-21 MED ORDER — BUPROPION HCL ER (SR) 150 MG PO TB12
150.0000 mg | ORAL_TABLET | Freq: Every day | ORAL | 0 refills | Status: DC
Start: 2023-08-21 — End: 2023-09-20

## 2023-08-21 NOTE — Telephone Encounter (Signed)
Patient received voicemail.  Came in at requested time.

## 2023-08-22 LAB — CBC WITH DIFFERENTIAL/PLATELET
Basophils Absolute: 0.1 10*3/uL (ref 0.0–0.2)
Basos: 1 %
EOS (ABSOLUTE): 0.1 10*3/uL (ref 0.0–0.4)
Eos: 2 %
Hematocrit: 40.8 % (ref 34.0–46.6)
Hemoglobin: 13.1 g/dL (ref 11.1–15.9)
Immature Grans (Abs): 0.1 10*3/uL (ref 0.0–0.1)
Immature Granulocytes: 1 %
Lymphocytes Absolute: 2.3 10*3/uL (ref 0.7–3.1)
Lymphs: 27 %
MCH: 27.6 pg (ref 26.6–33.0)
MCHC: 32.1 g/dL (ref 31.5–35.7)
MCV: 86 fL (ref 79–97)
Monocytes Absolute: 0.7 10*3/uL (ref 0.1–0.9)
Monocytes: 8 %
Neutrophils Absolute: 5.2 10*3/uL (ref 1.4–7.0)
Neutrophils: 61 %
Platelets: 338 10*3/uL (ref 150–450)
RBC: 4.74 x10E6/uL (ref 3.77–5.28)
RDW: 12.3 % (ref 11.7–15.4)
WBC: 8.4 10*3/uL (ref 3.4–10.8)

## 2023-08-22 LAB — CMP14+EGFR
ALT: 11 IU/L (ref 0–32)
AST: 16 IU/L (ref 0–40)
Albumin: 4 g/dL (ref 3.9–4.9)
Alkaline Phosphatase: 98 IU/L (ref 44–121)
BUN/Creatinine Ratio: 22 (ref 9–23)
BUN: 15 mg/dL (ref 6–20)
Bilirubin Total: <0.2 mg/dL (ref 0.0–1.2)
CO2: 22 mmol/L (ref 20–29)
Calcium: 9.3 mg/dL (ref 8.7–10.2)
Chloride: 102 mmol/L (ref 96–106)
Creatinine, Ser: 0.69 mg/dL (ref 0.57–1.00)
Globulin, Total: 2.7 g/dL (ref 1.5–4.5)
Glucose: 88 mg/dL (ref 70–99)
Potassium: 4.4 mmol/L (ref 3.5–5.2)
Sodium: 136 mmol/L (ref 134–144)
Total Protein: 6.7 g/dL (ref 6.0–8.5)
eGFR: 113 mL/min/{1.73_m2} (ref >59)

## 2023-08-22 LAB — LIPID PANEL WITH LDL/HDL RATIO
Cholesterol, Total: 159 mg/dL (ref 100–199)
HDL: 62 mg/dL (ref >39)
LDL Chol Calc (NIH): 83 mg/dL (ref 0–99)
LDL/HDL Ratio: 1.3 ratio (ref 0.0–3.2)
Triglycerides: 75 mg/dL (ref 0–149)
VLDL Cholesterol Cal: 14 mg/dL (ref 5–40)

## 2023-08-22 LAB — HEMOGLOBIN A1C
Est. average glucose Bld gHb Est-mCnc: 108 mg/dL
Hgb A1c MFr Bld: 5.4 % (ref 4.8–5.6)

## 2023-08-22 LAB — VITAMIN B12: Vitamin B-12: 363 pg/mL (ref 232–1245)

## 2023-08-22 LAB — TSH: TSH: 2.79 u[IU]/mL (ref 0.450–4.500)

## 2023-08-22 LAB — VITAMIN D 25 HYDROXY (VIT D DEFICIENCY, FRACTURES): Vit D, 25-Hydroxy: 21.8 ng/mL — ABNORMAL LOW (ref 30.0–100.0)

## 2023-08-22 LAB — INSULIN, RANDOM: INSULIN: 17.3 u[IU]/mL (ref 2.6–24.9)

## 2023-09-03 ENCOUNTER — Encounter: Payer: Self-pay | Admitting: Oncology

## 2023-09-03 NOTE — Progress Notes (Signed)
Faxed records per patient request to [facility] at [phone].

## 2023-09-13 ENCOUNTER — Encounter: Payer: Self-pay | Admitting: Gynecologic Oncology

## 2023-09-13 ENCOUNTER — Inpatient Hospital Stay: Payer: No Typology Code available for payment source | Attending: Gynecologic Oncology | Admitting: Gynecologic Oncology

## 2023-09-13 DIAGNOSIS — D259 Leiomyoma of uterus, unspecified: Secondary | ICD-10-CM

## 2023-09-13 DIAGNOSIS — N9489 Other specified conditions associated with female genital organs and menstrual cycle: Secondary | ICD-10-CM

## 2023-09-13 NOTE — Progress Notes (Unsigned)
Tried to call the patient at time of our scheduled phone visit, no answer. Mailbox is full - could not leave VM. Sent [employer].  Eugene Garnet MD Gynecologic Oncology

## 2023-09-20 ENCOUNTER — Ambulatory Visit (INDEPENDENT_AMBULATORY_CARE_PROVIDER_SITE_OTHER): Payer: No Typology Code available for payment source | Admitting: Family Medicine

## 2023-09-20 ENCOUNTER — Other Ambulatory Visit (INDEPENDENT_AMBULATORY_CARE_PROVIDER_SITE_OTHER): Payer: Self-pay | Admitting: Family Medicine

## 2023-09-20 ENCOUNTER — Encounter (INDEPENDENT_AMBULATORY_CARE_PROVIDER_SITE_OTHER): Payer: Self-pay | Admitting: Family Medicine

## 2023-09-20 VITALS — BP 123/65 | HR 66 | Temp 97.7°F | Ht 62.0 in | Wt 259.0 lb

## 2023-09-20 DIAGNOSIS — F3289 Other specified depressive episodes: Secondary | ICD-10-CM

## 2023-09-20 DIAGNOSIS — R632 Polyphagia: Secondary | ICD-10-CM

## 2023-09-20 DIAGNOSIS — R7303 Prediabetes: Secondary | ICD-10-CM | POA: Diagnosis not present

## 2023-09-20 DIAGNOSIS — E669 Obesity, unspecified: Secondary | ICD-10-CM | POA: Diagnosis not present

## 2023-09-20 DIAGNOSIS — Z6841 Body Mass Index (BMI) 40.0 and over, adult: Secondary | ICD-10-CM

## 2023-09-20 MED ORDER — ZEPBOUND 2.5 MG/0.5ML ~~LOC~~ SOAJ
2.5000 mg | SUBCUTANEOUS | 0 refills | Status: DC
Start: 2023-09-20 — End: 2023-10-23

## 2023-09-20 MED ORDER — BUPROPION HCL ER (SR) 200 MG PO TB12: 200.0000 mg | ORAL_TABLET | Freq: Every day | ORAL | 0 refills | Status: DC

## 2023-09-20 NOTE — Progress Notes (Signed)
Chief Complaint:   OBESITY [facility] is here to discuss her progress with her obesity treatment plan along with follow-up of her obesity related diagnoses. [facility] is on the Category 2 Plan and states she is following her eating plan approximately 85% of the time. [facility] states she is walking for 40 minutes 7 times per week.  Today's visit was #: 9 Starting weight: 257 lbs Starting date: 01/03/2022 Today's weight: 259 lbs Today's date: 09/20/2023 Total lbs lost to date: 0 Total lbs lost since last in-office visit: 4  Interim History: Patient is working on increasing her protein and decreasing simple carbohydrates.  She notes some increase in hunger in the evenings.  Subjective:   1. Prediabetes Patient noted metformin caused nausea and GI upset so she stopped.  2. Polyphagia Patient tried Wegovy in the past and had significant GI upset.  She asks if she can try Zepbound instead.  3. Emotional Eating Behavior Patient is on Wellbutrin with no side effects noted, but she is still having carbohydrate cravings at night.  Assessment/Plan:   1. Prediabetes Patient is to continue with her diet and weight loss to help prevent diabetes mellitus.  2. Polyphagia Patient agreed to start Zepbound 2.5 mg once weekly with no refills.  She is to let us know if a prior authorization is required.  - tirzepatide (ZEPBOUND) 2.5 MG/0.5ML Pen; Inject 2.5 mg into the skin once a week.  Dispense: 2 mL; Refill: 0  3. Emotional Eating Behavior Patient agreed to increase Wellbutrin SR to 200 mg every morning, and we will refill for 1 month.  We will follow-up at her next visit in 1 month.  - buPROPion (WELLBUTRIN SR) 200 MG 12 hr tablet; Take 1 tablet (200 mg total) by mouth daily.  Dispense: 30 tablet; Refill: 0  4. Obesity, Beginning BMI 40.92 [facility] is currently in the action stage of change. As such, her goal is to continue with weight loss efforts. She has agreed to keeping a food  journal and adhering to recommended goals of 1200-1300 calories and 85 grams of protein daily.   Exercise goals: As is.   Behavioral modification strategies: keeping healthy foods in the home.  [facility] has agreed to follow-up with our clinic in 4 weeks. She was informed of the importance of frequent follow-up visits to maximize her success with intensive lifestyle modifications for her multiple health conditions.   Objective:   Blood pressure 123/65, pulse 66, temperature 97.7 F (36.5 C), height 5\' 2"  (1.575 m), weight 259 lb (117.5 kg), SpO2 100%. Body mass index is 47.37 kg/m.  Lab Results  Component Value Date   CREATININE 0.69 08/21/2023   BUN 15 08/21/2023   NA 136 08/21/2023   K 4.4 08/21/2023   CL 102 08/21/2023   CO2 22 08/21/2023   Lab Results  Component Value Date   ALT 11 08/21/2023   AST 16 08/21/2023   ALKPHOS 98 08/21/2023   BILITOT <0.2 08/21/2023   Lab Results  Component Value Date   HGBA1C 5.4 08/21/2023   HGBA1C 5.2 11/10/2022   HGBA1C 5.6 11/09/2022   HGBA1C 5.7 (H) 01/03/2022   HGBA1C 5.2 03/10/2020   Lab Results  Component Value Date   INSULIN 17.3 08/21/2023   INSULIN 20.0 11/09/2022   INSULIN 12.0 01/03/2022   INSULIN 15.6 03/10/2020   INSULIN 13.5 09/29/2019   Lab Results  Component Value Date   TSH 2.790 08/21/2023   Lab Results  Component Value Date  CHOL 159 08/21/2023   HDL 62 08/21/2023   LDLCALC 83 08/21/2023   TRIG 75 08/21/2023   CHOLHDL 2.5 01/03/2022   Lab Results  Component Value Date   VD25OH 21.8 (L) 08/21/2023   VD25OH 23.3 (L) 11/09/2022   VD25OH 27.5 (L) 01/03/2022   Lab Results  Component Value Date   WBC 8.4 08/21/2023   HGB 13.1 08/21/2023   HCT 40.8 08/21/2023   MCV 86 08/21/2023   PLT 338 08/21/2023   No results found for: "IRON", "TIBC", "FERRITIN"  Attestation Statements:   Reviewed by clinician on day of visit: allergies, medications, problem list, medical history, surgical history, family  history, social history, and previous encounter notes.   I, Burt Knack, am acting as transcriptionist for Quillian Quince, MD.  I have reviewed the above documentation for accuracy and completeness, and I agree with the above. -  Quillian Quince, MD

## 2023-09-20 NOTE — Telephone Encounter (Signed)
Prior authorization done via cover my meds for patients Zepbound. Waiting on determination.

## 2023-09-24 ENCOUNTER — Other Ambulatory Visit (INDEPENDENT_AMBULATORY_CARE_PROVIDER_SITE_OTHER): Payer: Self-pay | Admitting: Physician Assistant

## 2023-09-24 DIAGNOSIS — E559 Vitamin D deficiency, unspecified: Secondary | ICD-10-CM

## 2023-09-28 ENCOUNTER — Encounter (INDEPENDENT_AMBULATORY_CARE_PROVIDER_SITE_OTHER): Payer: Self-pay | Admitting: Family Medicine

## 2023-09-29 ENCOUNTER — Other Ambulatory Visit (INDEPENDENT_AMBULATORY_CARE_PROVIDER_SITE_OTHER): Payer: Self-pay | Admitting: Family Medicine

## 2023-09-29 DIAGNOSIS — R632 Polyphagia: Secondary | ICD-10-CM

## 2023-10-04 ENCOUNTER — Other Ambulatory Visit (INDEPENDENT_AMBULATORY_CARE_PROVIDER_SITE_OTHER): Payer: Self-pay | Admitting: Family Medicine

## 2023-10-04 DIAGNOSIS — F3289 Other specified depressive episodes: Secondary | ICD-10-CM

## 2023-10-23 ENCOUNTER — Telehealth (INDEPENDENT_AMBULATORY_CARE_PROVIDER_SITE_OTHER): Payer: Self-pay | Admitting: *Deleted

## 2023-10-23 ENCOUNTER — Encounter (INDEPENDENT_AMBULATORY_CARE_PROVIDER_SITE_OTHER): Payer: Self-pay | Admitting: Family Medicine

## 2023-10-23 ENCOUNTER — Telehealth (INDEPENDENT_AMBULATORY_CARE_PROVIDER_SITE_OTHER): Payer: No Typology Code available for payment source | Admitting: Family Medicine

## 2023-10-23 VITALS — Ht 62.0 in | Wt 260.0 lb

## 2023-10-23 DIAGNOSIS — E669 Obesity, unspecified: Secondary | ICD-10-CM | POA: Diagnosis not present

## 2023-10-23 DIAGNOSIS — Z6841 Body Mass Index (BMI) 40.0 and over, adult: Secondary | ICD-10-CM

## 2023-10-23 DIAGNOSIS — E559 Vitamin D deficiency, unspecified: Secondary | ICD-10-CM | POA: Diagnosis not present

## 2023-10-23 DIAGNOSIS — F3289 Other specified depressive episodes: Secondary | ICD-10-CM | POA: Diagnosis not present

## 2023-10-23 DIAGNOSIS — R632 Polyphagia: Secondary | ICD-10-CM | POA: Diagnosis not present

## 2023-10-23 MED ORDER — BUPROPION HCL ER (SR) 200 MG PO TB12: 200.0000 mg | ORAL_TABLET | Freq: Every day | ORAL | 0 refills | Status: DC

## 2023-10-23 MED ORDER — ZEPBOUND 2.5 MG/0.5ML ~~LOC~~ SOAJ
2.5000 mg | SUBCUTANEOUS | 0 refills | Status: DC
Start: 2023-10-23 — End: 2024-08-04

## 2023-10-23 MED ORDER — VITAMIN D3 1.25 MG (50000 UT) PO CAPS: 1.0000 | ORAL_CAPSULE | ORAL | 0 refills | Status: DC

## 2023-10-23 NOTE — Progress Notes (Unsigned)
TeleHealth Visit:  Due to the COVID-19 pandemic, this visit was completed with telemedicine (audio/video) technology to reduce patient and provider exposure as well as to preserve personal protective equipment.   [facility] has verbally consented to this TeleHealth visit. The patient is located at home, the provider is located at the [facility] office. The participants in this visit include the listed provider and patient. The visit was conducted today via MyChart video.   Chief Complaint: OBESITY [facility] is here to discuss her progress with her obesity treatment plan along with follow-up of her obesity related diagnoses. [facility] is on keeping a food journal and adhering to recommended goals of 1200-1300 calories and 85 grams of protein and states she is following her eating plan approximately 80% of the time. [facility] states she is walking for 40 minutes 7 times per week.  Today's visit was #: 10 Starting weight: 257 lbs Starting date: 01/03/2022  Interim History: Patient has been sick with a fever and chills over the last 2 days, and she would like to do a virtual visit today. She is working on increasing protein but she notes increased PM polyphagia. She feels she is not eating enough at breakfast.   Subjective:   1. Polyphagia Patient was unable to get Zepbound due to insurance coverage. She was told that her insurance would cover this after October and would like to try to be on this again.   2. Vitamin D deficiency Patient's last Vitamin D was below goal. She is stable on Vitamin D prescription and she is due for labs soon.   3. Emotional Eating Behavior Patient is working on decreasing emotional eating behavior. No side effects were noted on Wellbutrin.   Assessment/Plan:   1. Polyphagia We will resubmit prior authorization for Zepbound. Patient will continue to work on a structured diet and exercise plan.   - tirzepatide (ZEPBOUND) 2.5 MG/0.5ML Pen; Inject  2.5 mg into the skin once a week. Dispense: 2 mL; Refill: 0   2. Vitamin D deficiency Patient will continue prescription Vitamin D, and we will refill for 1 month.   - Cholecalciferol (VITAMIN D3) 1.25 MG (50000 UT) CAPS; Take 1 capsule by mouth once a week. (Patient taking differently: Take 50,000 Units by mouth once a week. Friday)  Dispense: 4 capsule; Refill: 0   3. Emotional Eating Behavior Patient will continue Wellbutrin SR, and we will refill for 1 month.   - buPROPion (WELLBUTRIN SR) 200 MG 12 hr tablet; Take 1 tablet (200 mg total) by mouth daily.  Dispense: 30 tablet; Refill: 0   4. BMI 45.0-49.9, adult (HCC) Current BMI 48.2  5. Obesity, Beginning BMI 40.92 [facility] is currently in the action stage of change. As such, her goal is to continue with weight loss efforts. She has agreed to keeping a food journal and adhering to recommended goals of 1200-1300 calories and 85+ grams of protein daily.   Behavioral modification strategies: no skipping meals.  [facility] has agreed to follow-up with our clinic in 4 weeks. She was informed of the importance of frequent follow-up visits to maximize her success with intensive lifestyle modifications for her multiple health conditions.  Objective:   VITALS: Per patient if applicable, see vitals. GENERAL: Alert and in no acute distress. CARDIOPULMONARY: No increased WOB. Speaking in clear sentences.  PSYCH: Pleasant and cooperative. Speech normal rate and rhythm. Affect is appropriate. Insight and judgement are appropriate. Attention is focused, linear, and appropriate.  NEURO: Oriented as arrived  to appointment on time with no prompting.   Lab Results  Component Value Date   CREATININE 0.69 08/21/2023   BUN 15 08/21/2023   NA 136 08/21/2023   K 4.4 08/21/2023   CL 102 08/21/2023   CO2 22 08/21/2023   Lab Results  Component Value Date   ALT 11 08/21/2023   AST 16 08/21/2023   ALKPHOS 98 08/21/2023   BILITOT <0.2 08/21/2023    Lab Results  Component Value Date   HGBA1C 5.4 08/21/2023   HGBA1C 5.2 11/10/2022   HGBA1C 5.6 11/09/2022   HGBA1C 5.7 (H) 01/03/2022   HGBA1C 5.2 03/10/2020   Lab Results  Component Value Date   INSULIN 17.3 08/21/2023   INSULIN 20.0 11/09/2022   INSULIN 12.0 01/03/2022   INSULIN 15.6 03/10/2020   INSULIN 13.5 09/29/2019   Lab Results  Component Value Date   TSH 2.790 08/21/2023   Lab Results  Component Value Date   CHOL 159 08/21/2023   HDL 62 08/21/2023   LDLCALC 83 08/21/2023   TRIG 75 08/21/2023   CHOLHDL 2.5 01/03/2022   Lab Results  Component Value Date   VD25OH 21.8 (L) 08/21/2023   VD25OH 23.3 (L) 11/09/2022   VD25OH 27.5 (L) 01/03/2022   Lab Results  Component Value Date   WBC 8.4 08/21/2023   HGB 13.1 08/21/2023   HCT 40.8 08/21/2023   MCV 86 08/21/2023   PLT 338 08/21/2023   No results found for: "IRON", "TIBC", "FERRITIN"  Attestation Statements:   Reviewed by clinician on day of visit: allergies, medications, problem list, medical history, surgical history, family history, social history, and previous encounter notes.   I, Burt Knack, am acting as transcriptionist for Quillian Quince, MD.  I have reviewed the above documentation for accuracy and completeness, and I agree with the above. - Quillian Quince, MD

## 2023-10-23 NOTE — Telephone Encounter (Signed)
[facility] (Key: [id number])  Your demographic data has been sent to [employer] successfully!  [employer] typically takes 5-10 minutes to respond, but it may take a little longer in some cases. You will be notified by email when available. You can also check for an update later by opening this request from your dashboard. Please do not fax or call [employer] to resubmit this request. If you need assistance, please chat with [employer] or call us at [phone].  If it has been longer than 24 hours, please reach out to [employer].

## 2023-10-24 ENCOUNTER — Telehealth (INDEPENDENT_AMBULATORY_CARE_PROVIDER_SITE_OTHER): Payer: Self-pay | Admitting: *Deleted

## 2023-10-24 NOTE — Telephone Encounter (Signed)
PA SUBMITTED VIA [employer] FOR WEGOVY.    BRITTNANY PACETTI (Key: [id number])  Your information has been submitted to [employer]. To check for an updated outcome later, reopen this PA request from your dashboard.  If [employer] has not responded to your request within 24 hours, contact [employer] at [phone]. If you think there may be a problem with your PA request, use our live chat feature at the bottom right.

## 2023-10-24 NOTE — Telephone Encounter (Signed)
Zepbound was denied and patient received notice through [employer]. Mychart message also sent to patient.

## 2023-10-25 NOTE — Telephone Encounter (Signed)
Patient sent most recent insurance card and PA has been resubmitted.    [facility] (Key: [id number])  Your information has been submitted to [employer]. To check for an updated outcome later, reopen this PA request from your dashboard.  If [employer] has not responded to your request within 24 hours, contact [employer] at [phone]. If you think there may be a problem with your PA request, use our live chat feature at the bottom right.

## 2023-10-29 NOTE — Telephone Encounter (Signed)
[facility] was approved. Patient notified via Mychart.

## 2023-11-11 ENCOUNTER — Encounter (HOSPITAL_BASED_OUTPATIENT_CLINIC_OR_DEPARTMENT_OTHER): Payer: Self-pay

## 2023-11-11 ENCOUNTER — Emergency Department (HOSPITAL_BASED_OUTPATIENT_CLINIC_OR_DEPARTMENT_OTHER)
Admission: EM | Admit: 2023-11-11 | Discharge: 2023-11-11 | Disposition: A | Discharge status: Home / Self Care | Payer: No Typology Code available for payment source | Attending: Emergency Medicine | Admitting: Emergency Medicine

## 2023-11-11 ENCOUNTER — Other Ambulatory Visit: Payer: Self-pay

## 2023-11-11 DIAGNOSIS — R1084 Generalized abdominal pain: Secondary | ICD-10-CM | POA: Insufficient documentation

## 2023-11-11 DIAGNOSIS — R197 Diarrhea, unspecified: Secondary | ICD-10-CM | POA: Insufficient documentation

## 2023-11-11 DIAGNOSIS — R112 Nausea with vomiting, unspecified: Secondary | ICD-10-CM | POA: Insufficient documentation

## 2023-11-11 DIAGNOSIS — R110 Nausea: Secondary | ICD-10-CM | POA: Diagnosis present

## 2023-11-11 DIAGNOSIS — Z1152 Encounter for screening for COVID-19: Secondary | ICD-10-CM | POA: Insufficient documentation

## 2023-11-11 LAB — URINALYSIS, ROUTINE W REFLEX MICROSCOPIC
Bilirubin Urine: NEGATIVE
Glucose, UA: NEGATIVE mg/dL
Hgb urine dipstick: NEGATIVE
Ketones, ur: NEGATIVE mg/dL
Leukocytes,Ua: NEGATIVE
Nitrite: NEGATIVE
Protein, ur: NEGATIVE mg/dL
Specific Gravity, Urine: 1.02 (ref 1.005–1.030)
pH: 8.5 — ABNORMAL HIGH (ref 5.0–8.0)

## 2023-11-11 LAB — COMPREHENSIVE METABOLIC PANEL
ALT: 14 U/L (ref 0–44)
AST: 21 U/L (ref 15–41)
Albumin: 3.7 g/dL (ref 3.5–5.0)
Alkaline Phosphatase: 79 U/L (ref 38–126)
Anion gap: 10 (ref 5–15)
BUN: 14 mg/dL (ref 6–20)
CO2: 20 mmol/L — ABNORMAL LOW (ref 22–32)
Calcium: 9.2 mg/dL (ref 8.9–10.3)
Chloride: 105 mmol/L (ref 98–111)
Creatinine, Ser: 0.68 mg/dL (ref 0.44–1.00)
GFR, Estimated: >60 mL/min (ref >60)
Glucose, Bld: 113 mg/dL — ABNORMAL HIGH (ref 70–99)
Potassium: 3.8 mmol/L (ref 3.5–5.1)
Sodium: 135 mmol/L (ref 135–145)
Total Bilirubin: 0.6 mg/dL (ref <1.2)
Total Protein: 7.8 g/dL (ref 6.5–8.1)

## 2023-11-11 LAB — CBC WITH DIFFERENTIAL/PLATELET
Abs Immature Granulocytes: 0.05 10*3/uL (ref 0.00–0.07)
Basophils Absolute: 0.1 10*3/uL (ref 0.0–0.1)
Basophils Relative: 1 %
Eosinophils Absolute: 0 10*3/uL (ref 0.0–0.5)
Eosinophils Relative: 0 %
HCT: 39 % (ref 36.0–46.0)
Hemoglobin: 12.8 g/dL (ref 12.0–15.0)
Immature Granulocytes: 1 %
Lymphocytes Relative: 11 %
Lymphs Abs: 1.2 10*3/uL (ref 0.7–4.0)
MCH: 26.9 pg (ref 26.0–34.0)
MCHC: 32.8 g/dL (ref 30.0–36.0)
MCV: 81.9 fL (ref 80.0–100.0)
Monocytes Absolute: 0.5 10*3/uL (ref 0.1–1.0)
Monocytes Relative: 5 %
Neutro Abs: 8.7 10*3/uL — ABNORMAL HIGH (ref 1.7–7.7)
Neutrophils Relative %: 82 %
Platelets: 300 10*3/uL (ref 150–400)
RBC: 4.76 MIL/uL (ref 3.87–5.11)
RDW: 13.3 % (ref 11.5–15.5)
WBC: 10.4 10*3/uL (ref 4.0–10.5)
nRBC: 0 % (ref 0.0–0.2)

## 2023-11-11 LAB — RESP PANEL BY RT-PCR (RSV, FLU A&B, COVID)  RVPGX2
Influenza A by PCR: NEGATIVE
Influenza B by PCR: NEGATIVE
Resp Syncytial Virus by PCR: NEGATIVE
SARS Coronavirus 2 by RT PCR: NEGATIVE

## 2023-11-11 LAB — LIPASE, BLOOD: Lipase: 32 U/L (ref 11–51)

## 2023-11-11 LAB — PREGNANCY, URINE: Preg Test, Ur: NEGATIVE

## 2023-11-11 MED ORDER — MORPHINE SULFATE (PF) 4 MG/ML IV SOLN
4.0000 mg | Freq: Once | INTRAVENOUS | Status: AC
  Administered 2023-11-11: 4 mg via INTRAVENOUS
  Filled 2023-11-11: qty 1

## 2023-11-11 MED ORDER — SODIUM CHLORIDE 0.9 % IV SOLN
12.5000 mg | Freq: Once | INTRAVENOUS | Status: AC
  Administered 2023-11-11: 12.5 mg via INTRAVENOUS
  Filled 2023-11-11: qty 0.5

## 2023-11-11 MED ORDER — ALUM & MAG HYDROXIDE-SIMETH 200-200-20 MG/5ML PO SUSP
30.0000 mL | Freq: Once | ORAL | Status: AC
  Administered 2023-11-11: 30 mL via ORAL
  Filled 2023-11-11: qty 30

## 2023-11-11 MED ORDER — METOCLOPRAMIDE HCL 5 MG/ML IJ SOLN
10.0000 mg | Freq: Once | INTRAMUSCULAR | Status: AC
  Administered 2023-11-11: 10 mg via INTRAVENOUS
  Filled 2023-11-11: qty 2

## 2023-11-11 MED ORDER — PROMETHAZINE HCL 25 MG RE SUPP: 25.0000 mg | Freq: Four times a day (QID) | RECTAL | 0 refills | Status: AC | PRN

## 2023-11-11 MED ORDER — ONDANSETRON 4 MG PO TBDP: 4.0000 mg | ORAL_TABLET | Freq: Once | ORAL | Status: DC

## 2023-11-11 MED ORDER — PROMETHAZINE HCL 25 MG/ML IJ SOLN
INTRAMUSCULAR | Status: AC
  Filled 2023-11-11: qty 1

## 2023-11-11 MED ORDER — ONDANSETRON HCL 4 MG/2ML IJ SOLN
4.0000 mg | Freq: Once | INTRAMUSCULAR | Status: AC | PRN
  Administered 2023-11-11: 4 mg via INTRAVENOUS
  Filled 2023-11-11: qty 2

## 2023-11-11 MED ORDER — SODIUM CHLORIDE 0.9 % IV BOLUS
1000.0000 mL | Freq: Once | INTRAVENOUS | Status: AC
  Administered 2023-11-11: 1000 mL via INTRAVENOUS

## 2023-11-11 NOTE — Discharge Instructions (Signed)
You have been seen today for your complaint of nausea, vomiting. Your lab work was reassuring. Your discharge medications include Phenergan.  This is a nausea medicine.  Use it as needed in order to eat and drink normal diet. Home care instructions are as follows:  Drink plenty of liquids.  Eat easy to digest foods such as soups and broths until your symptoms resolve Follow up with: Your primary care provider in 1 week for reevaluation Please seek immediate medical care if you develop any of the following symptoms: You have trouble: Breathing. Swallowing. Talking. Moving. You have blurry vision. You faint. The whites of your eyes turn yellow (jaundice). You cannot eat or drink without vomiting. You continue to vomit or have watery poop. You start to have pain in your belly, your belly pain gets worse, or your belly pain stays in one small area. You have a fever. You have blood or mucus in your poop (stools), or your poop looks dark black and tarry. You have signs of not having enough water in your body, such as: Dark pee, very little pee, or no pee. Not making tears while crying. Dry mouth or lips. Sunken eyes. Being sleepy. Feeling weak. Being dizzy. At this time there does not appear to be the presence of an emergent medical condition, however there is always the potential for conditions to change. Please read and follow the below instructions.  Do not take your medicine if  develop an itchy rash, swelling in your mouth or lips, or difficulty breathing; call 911 and seek immediate emergency medical attention if this occurs.  You may review your lab tests and imaging results in their entirety on your MyChart account.  Please discuss all results of fully with your primary care provider and other specialist at your follow-up visit.  Note: Portions of this text may have been transcribed using voice recognition software. Every effort was made to ensure accuracy; however, inadvertent  computerized transcription errors may still be present.

## 2023-11-11 NOTE — ED Notes (Signed)
Attempted IV access to RAC and LAC, tol well but unsuccessful

## 2023-11-11 NOTE — ED Triage Notes (Signed)
The patient started vomiting at 3 am. She is having abd pain, chills and shortness of breath.

## 2023-11-11 NOTE — ED Provider Notes (Signed)
[place] [facility] Provider Note   CSN: [id number] Arrival date & time: 11/11/23  4098     History  Chief Complaint  Patient presents with   Emesis   Abdominal Pain    Heather Boone is a 42 y.o. female.  With a history of prediabetes, B12 deficiency presenting for evaluation of nausea.  Symptoms began approximately 5 this morning.  She believes she may have developed a foodborne illness from a hamburger that she ate yesterday evening.  She reports mild generalized abdominal pain. She has had numerous episodes of vomiting.  No hematemesis, melena or hematochezia. No urinary symptoms. No fevers. Endorses chills. One episode of diarrhea.    Emesis Associated symptoms: abdominal pain   Abdominal Pain Associated symptoms: vomiting        Home Medications Prior to Admission medications   Medication Sig Start Date End Date Taking? Authorizing Provider  promethazine (PHENERGAN) 25 MG suppository Place 1 suppository (25 mg total) rectally every 6 (six) hours as needed for nausea or vomiting. 11/11/23  Yes Elleanna Melling, Edsel Petrin, PA-C  acetaminophen (TYLENOL) 325 MG tablet Take 2 tablets (650 mg total) by mouth every 6 (six) hours as needed. Patient taking differently: Take 650 mg by mouth every 6 (six) hours as needed for mild pain. 04/28/22   Sloan Leiter, DO  buPROPion (WELLBUTRIN SR) 200 MG 12 hr tablet Take 1 tablet (200 mg total) by mouth daily. 10/23/23   Quillian Quince D, MD  Cholecalciferol (VITAMIN D3) 1.25 MG (50000 UT) CAPS Take 1 capsule (1.25 mg total) by mouth once a week. 10/23/23   Quillian Quince D, MD  famotidine (PEPCID) 20 MG tablet Take 1 tablet (20 mg total) by mouth 2 (two) times daily. 05/18/23   Renne Crigler, PA-C  metFORMIN (GLUCOPHAGE) 500 MG tablet Take 1 tablet (500 mg total) by mouth daily. 08/21/23 09/20/23  Rayburn, Fanny Bien, PA-C  ondansetron (ZOFRAN-ODT) 4 MG disintegrating tablet Take 1 tablet (4 mg total) by  mouth every 8 (eight) hours as needed for nausea or vomiting. 05/18/23   Renne Crigler, PA-C  pantoprazole (PROTONIX) 20 MG tablet Take 1 tablet (20 mg total) by mouth daily. 05/18/23   Renne Crigler, PA-C  tirzepatide (ZEPBOUND) 2.5 MG/0.5ML Pen Inject 2.5 mg into the skin once a week. 10/23/23   Quillian Quince D, MD      Allergies    Toradol [ketorolac tromethamine], Aleve [naproxen sodium], Motrin [ibuprofen], Other, and Tramadol    Review of Systems   Review of Systems  Gastrointestinal:  Positive for abdominal pain and vomiting.  All other systems reviewed and are negative.   Physical Exam Updated Vital Signs BP (!) 164/96   Pulse 92   Temp 97.7 F (36.5 C) (Oral)   Resp 18   Ht 5\' 2"  (1.575 m)   Wt 118 kg   LMP 10/13/2023 (Exact Date)   SpO2 100%   BMI 47.58 kg/m  Physical Exam Vitals and nursing note reviewed.  Constitutional:      General: She is not in acute distress.    Appearance: She is well-developed.     Comments: Appears uncomfortable.  HENT:     Head: Normocephalic and atraumatic.  Eyes:     Conjunctiva/sclera: Conjunctivae normal.  Cardiovascular:     Rate and Rhythm: Normal rate and regular rhythm.     Heart sounds: No murmur heard. Pulmonary:     Effort: Pulmonary effort is normal. No respiratory distress.  Breath sounds: Normal breath sounds.  Abdominal:     Palpations: Abdomen is soft.     Tenderness: There is no abdominal tenderness. There is no guarding.  Musculoskeletal:        General: No swelling.     Cervical back: Neck supple.  Skin:    General: Skin is warm and dry.     Capillary Refill: Capillary refill takes less than 2 seconds.  Neurological:     Mental Status: She is alert.  Psychiatric:        Mood and Affect: Mood normal.     ED Results / Procedures / Treatments   Labs (all labs ordered are listed, but only abnormal results are displayed) Labs Reviewed  COMPREHENSIVE METABOLIC PANEL - Abnormal; Notable for the  following components:      Result Value   CO2 20 (*)    Glucose, Bld 113 (*)    All other components within normal limits  URINALYSIS, ROUTINE W REFLEX MICROSCOPIC - Abnormal; Notable for the following components:   pH 8.5 (*)    All other components within normal limits  CBC WITH DIFFERENTIAL/PLATELET - Abnormal; Notable for the following components:   Neutro Abs 8.7 (*)    All other components within normal limits  RESP PANEL BY RT-PCR (RSV, FLU A&B, COVID)  RVPGX2  LIPASE, BLOOD  PREGNANCY, URINE    EKG None  Radiology No results found.  Procedures Procedures    Medications Ordered in ED Medications  promethazine (PHENERGAN) 25 MG/ML injection (0 mg  Hold 11/11/23 1100)  ondansetron (ZOFRAN) injection 4 mg (4 mg Intravenous Given 11/11/23 1003)  sodium chloride 0.9 % bolus 1,000 mL (0 mLs Intravenous Stopped 11/11/23 1123)  morphine (PF) 4 MG/ML injection 4 mg (4 mg Intravenous Given 11/11/23 0959)  promethazine (PHENERGAN) 12.5 mg in sodium chloride 0.9 % 50 mL IVPB (0 mg Intravenous Stopped 11/11/23 1123)  metoCLOPramide (REGLAN) injection 10 mg (10 mg Intravenous Given 11/11/23 1203)  alum & mag hydroxide-simeth (MAALOX/MYLANTA) 200-200-20 MG/5ML suspension 30 mL (30 mLs Oral Given 11/11/23 1241)    ED Course/ Medical Decision Making/ A&P Clinical Course as of 11/11/23 1423  Sun Nov 11, 2023  1201 Patient still feeling nauseous.  States she had an upper episode of emesis, however there is no emesis in the emesis bag she is holding.  Will trial Reglan. [AS]    Clinical Course User Index [AS] Inari Shin, Edsel Petrin, PA-C                                 Medical Decision Making Amount and/or Complexity of Data Reviewed Labs: ordered.  Risk OTC drugs. Prescription drug management.  This patient presents to the ED for concern of vomiting, this involves an extensive number of treatment options, and is a complaint that carries with it a high risk of complications and  morbidity. The emergent differential diagnosis for vomiting includes, but is not limited to ACS/MI, DKA, Ischemic bowel, Meningitis, Sepsis, Acute gastric dilation, Adrenal insufficiency, Appendicitis,  Bowel obstruction/ileus, Carbon monoxide poisoning, Cholecystitis, Electrolyte abnormalities, Elevated ICP, Gastric outlet obstruction, Pancreatitis, Ruptured viscus, Biliary colic, Cannabinoid hyperemesis syndrome, Gastritis, Gastroenteritis, Gastroparesis,  Narcotic withdrawal, Peptic ulcer disease, and UTI   My initial workup includes labs, symptom control  Additional history obtained from: Nursing notes from this visit. Family father at bedside provides a portion of the history  I ordered, reviewed and interpreted labs which include: CBC,  CMP, lipase, urinalysis, urine pregnancy, respiratory panel.  Hyperglycemia 113.  Labs otherwise within normal limits  Afebrile, hypertensive but otherwise hemodynamically stable.  42 year old female presenting for evaluation of nausea and vomiting.  Symptoms began this morning.  She believes this is due to contaminated food intake.  Minimal to no abdominal pain.  Lab workup was reassuring.  She appears well on physical exam.  She was treated symptomatically in the emergency department and reported improvement in her symptoms.  She still felt nauseous but she was able to tolerate oral intake without difficulty.  Overall suspect gastroenteritis.  Will prescribe rectal Phenergan.  She was educated on supportive care at home.  She was given return precautions.  Stable at discharge.  At this time there does not appear to be any evidence of an acute emergency medical condition and the patient appears stable for discharge with appropriate outpatient follow up. Diagnosis was discussed with patient who verbalizes understanding of care plan and is agreeable to discharge. I have discussed return precautions with patient who verbalizes understanding. Patient encouraged to  follow-up with their PCP within 1 week. All questions answered.  Note: Portions of this report may have been transcribed using voice recognition software. Every effort was made to ensure accuracy; however, inadvertent computerized transcription errors may still be present.        Final Clinical Impression(s) / ED Diagnoses Final diagnoses:  Nausea and vomiting, unspecified vomiting type    Rx / DC Orders ED Discharge Orders          Ordered    promethazine (PHENERGAN) 25 MG suppository  Every 6 hours PRN        11/11/23 1421              Michelle Piper, Cordelia Poche 11/11/23 1423    Terrilee Files, MD 11/11/23 1734

## 2023-12-05 ENCOUNTER — Other Ambulatory Visit (INDEPENDENT_AMBULATORY_CARE_PROVIDER_SITE_OTHER): Payer: Self-pay | Admitting: Family Medicine

## 2023-12-05 DIAGNOSIS — E559 Vitamin D deficiency, unspecified: Secondary | ICD-10-CM

## 2023-12-08 ENCOUNTER — Encounter (INDEPENDENT_AMBULATORY_CARE_PROVIDER_SITE_OTHER): Payer: Self-pay | Admitting: Family Medicine

## 2023-12-16 ENCOUNTER — Other Ambulatory Visit (INDEPENDENT_AMBULATORY_CARE_PROVIDER_SITE_OTHER): Payer: Self-pay | Admitting: Family Medicine

## 2023-12-16 DIAGNOSIS — F3289 Other specified depressive episodes: Secondary | ICD-10-CM

## 2024-01-01 ENCOUNTER — Encounter (INDEPENDENT_AMBULATORY_CARE_PROVIDER_SITE_OTHER): Payer: Self-pay | Admitting: Family Medicine

## 2024-01-07 ENCOUNTER — Encounter (INDEPENDENT_AMBULATORY_CARE_PROVIDER_SITE_OTHER): Payer: Self-pay | Admitting: Family Medicine

## 2024-01-08 ENCOUNTER — Ambulatory Visit (INDEPENDENT_AMBULATORY_CARE_PROVIDER_SITE_OTHER): Payer: No Typology Code available for payment source | Admitting: Internal Medicine

## 2024-01-09 ENCOUNTER — Telehealth: Payer: Self-pay | Admitting: *Deleted

## 2024-01-09 NOTE — Telephone Encounter (Signed)
-----   Message from Suzi Essex sent at 01/08/2024  8:33 PM EST ----- Pandora Bogaert, Could you please reach out to this patient to see how she is doing? Did she meet with a new REI practice? We had discussed follow-up imaging in 6-9 months depending on where things stood with her fertility plans. Thank you, Kat

## 2024-01-09 NOTE — Telephone Encounter (Signed)
 Spoke with Ms. Echeverry this morning. Relayed message from Dr. Orvil Bland in regards to how patient is doing?  Ms. Heyd states that currently she has walking pneumonia and is taking care of herself by resting at this time. Pt is also in the process of going through fertility treatment with [employer]. States they are in the process of working with her husband with testing at this time. Discussed follow up imaging with patient per Dr.Tucker's progress note at 6-9 months from visit which is December-March depending on pt's fertility journey. Pt states she would like to hold off on imaging at this time.  Advised patient that her message would be relayed to provider and reminded her to call the office or send message via MyChart with any concerns. Pt thanked the office for calling.

## 2024-03-04 ENCOUNTER — Encounter (INDEPENDENT_AMBULATORY_CARE_PROVIDER_SITE_OTHER): Payer: Self-pay

## 2024-07-31 DIAGNOSIS — Z0289 Encounter for other administrative examinations: Secondary | ICD-10-CM

## 2024-08-04 ENCOUNTER — Encounter (INDEPENDENT_AMBULATORY_CARE_PROVIDER_SITE_OTHER): Payer: Self-pay | Admitting: Nurse Practitioner

## 2024-08-04 ENCOUNTER — Ambulatory Visit (INDEPENDENT_AMBULATORY_CARE_PROVIDER_SITE_OTHER): Admitting: Nurse Practitioner

## 2024-08-04 ENCOUNTER — Other Ambulatory Visit (INDEPENDENT_AMBULATORY_CARE_PROVIDER_SITE_OTHER): Payer: Self-pay | Admitting: Nurse Practitioner

## 2024-08-04 VITALS — BP 137/83 | HR 78 | Temp 98.4°F | Ht 62.0 in | Wt 259.0 lb

## 2024-08-04 DIAGNOSIS — E559 Vitamin D deficiency, unspecified: Secondary | ICD-10-CM

## 2024-08-04 DIAGNOSIS — R632 Polyphagia: Secondary | ICD-10-CM

## 2024-08-04 DIAGNOSIS — F5089 Other specified eating disorder: Secondary | ICD-10-CM | POA: Diagnosis not present

## 2024-08-04 DIAGNOSIS — F3289 Other specified depressive episodes: Secondary | ICD-10-CM

## 2024-08-04 DIAGNOSIS — Z9884 Bariatric surgery status: Secondary | ICD-10-CM

## 2024-08-04 DIAGNOSIS — Z6841 Body Mass Index (BMI) 40.0 and over, adult: Secondary | ICD-10-CM

## 2024-08-04 DIAGNOSIS — R7303 Prediabetes: Secondary | ICD-10-CM

## 2024-08-04 DIAGNOSIS — E66813 Obesity, class 3: Secondary | ICD-10-CM

## 2024-08-04 MED ORDER — ZEPBOUND 2.5 MG/0.5ML ~~LOC~~ SOAJ
2.5000 mg | SUBCUTANEOUS | 0 refills | Status: DC
Start: 2024-08-04 — End: 2024-09-09

## 2024-08-04 MED ORDER — VITAMIN D3 1.25 MG (50000 UT) PO CAPS: 1.0000 | ORAL_CAPSULE | ORAL | 0 refills | Status: DC

## 2024-08-04 MED ORDER — BUPROPION HCL ER (SR) 200 MG PO TB12: 200.0000 mg | ORAL_TABLET | Freq: Every day | ORAL | 0 refills | Status: DC

## 2024-08-04 MED ORDER — METFORMIN HCL 500 MG PO TABS: 500.0000 mg | ORAL_TABLET | Freq: Every day | ORAL | 0 refills | Status: DC

## 2024-08-04 NOTE — Progress Notes (Addendum)
 Office: [phone]  /  Fax: [phone]  WEIGHT SUMMARY AND BIOMETRICS  Weight Lost Since Last Visit: 1 lb  Weight Gained Since Last Visit: 0   Vitals Temp: 98.4 F (36.9 C) BP: 137/83 Pulse Rate: 78 SpO2: 100 %   Anthropometric Measurements Height: 5' 2 (1.575 m) Weight: 259 lb (117.5 kg) BMI (Calculated): 47.36 Weight at Last Visit: 260 lb Weight Lost Since Last Visit: 1 lb Weight Gained Since Last Visit: 0 Starting Weight: 257 lb Total Weight Loss (lbs): 0 lb (0 kg)   Body Composition  Body Fat %: 53.1 % Fat Mass (lbs): 137.8 lbs Muscle Mass (lbs): 115.6 lbs Total Body Water (lbs): 90.4 lbs Visceral Fat Rating : 17   Other Clinical Data Fasting: No Labs: No Today's Visit #: 12 Starting Date: 01/03/22 Comments: Last seen 09/20/2023  Starting weight 01/31/2021 260 Todays weight 259.6   HPI  Chief Complaint: OBESITY  Heather Boone is here to discuss her progress with her obesity treatment plan. She is restarting the plan.  She has not been seen since 10/23/23. Has not been following her Category 2 eating plan. She states she is not currently exercising.   Interval History:  Mone's last visit was 10/23/2023 for a video visit and 09/20/2023 for an in person visit with Dr. Verdon. She has been on Wegovy  in the past with highest dose of 1.7 mg SQ QW. She wanted to switch to Zepbound  but her insurance would not cover that medication. She previously underwent laparoscopic gastric banding in 11/2010 at [facility]. She has used Buproprion 200 mg every day off label for emotional eating behaviors and Metformin  500 mg every day.   She was previously on cholecalciferol 50000 units once a week.  Last Vit D level was 21.8 on 08/21/23.   She does have a history of prediabetes with an A1c of 5.7 on 01/03/22. She was previously on Metformin  500 mg TID which was decreased to every day with GLP1. She did use Semaglutide  1.7, last dose 10/2022 but caused severe nausea and  vomiting. She does have new insurance that does cover Zepbound .   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 Fertility doctor does want patient to lose 30 pounds before starting IVF.   Pharmacotherapy for weight loss: She is not currently taking Zepbound , Wellbutrin , and Metformin  for medical weight loss, she has been out of medication.  Denies side effects.    Previous pharmacotherapy for medical weight loss:  Metformin   She decreased from TID to QD when she started GLP1. Wegovy  1.7 caused severe nausea and vomiting  Bariatric surgery:  Patient is status post laparoscopic gastric banding at [facility] with Dr. Wolm in 2010.   Her highest weight prior to surgery was 340 and her nadir weight after surgery was 180.  She is taking None.  She reports no restriction.     PHYSICAL EXAM:  Blood pressure 137/83, pulse 78, temperature 98.4 F (36.9 C), height 5' 2 (1.575 m), weight 259 lb (117.5 kg), SpO2 100%. Body mass index is 47.37 kg/m.  General: She is overweight, cooperative, alert, well developed, and in no acute distress. PSYCH: Has normal mood, affect and thought process.   Extremities: No edema.  Neurologic: No gross sensory or motor deficits. No tremors or fasciculations noted.    DIAGNOSTIC DATA REVIEWED:  BMET    Component Value Date/Time   NA 135 11/11/2023 0934   NA 136 08/21/2023 0819   K 3.8 11/11/2023 0934  CL 105 11/11/2023 0934   CO2 20 (L) 11/11/2023 0934   GLUCOSE 113 (H) 11/11/2023 0934   BUN 14 11/11/2023 0934   BUN 15 08/21/2023 0819   CREATININE 0.68 11/11/2023 0934   CALCIUM 9.2 11/11/2023 0934   GFRNONAA >60 11/11/2023 0934   GFRAA 130 03/10/2020 1146   Lab Results  Component Value Date   HGBA1C 5.4 08/21/2023   HGBA1C 5.5 07/13/2017   Lab Results  Component Value Date   INSULIN  17.3 08/21/2023   INSULIN  11.0 07/13/2017   Lab Results  Component Value Date   TSH 2.790 08/21/2023   CBC     Component Value Date/Time   WBC 10.4 11/11/2023 0935   RBC 4.76 11/11/2023 0935   HGB 12.8 11/11/2023 0935   HGB 13.1 08/21/2023 0819   HCT 39.0 11/11/2023 0935   HCT 40.8 08/21/2023 0819   PLT 300 11/11/2023 0935   PLT 338 08/21/2023 0819   MCV 81.9 11/11/2023 0935   MCV 86 08/21/2023 0819   MCH 26.9 11/11/2023 0935   MCHC 32.8 11/11/2023 0935   RDW 13.3 11/11/2023 0935   RDW 12.3 08/21/2023 0819   Iron Studies No results found for: IRON, TIBC, FERRITIN, IRONPCTSAT Lipid Panel     Component Value Date/Time   CHOL 159 08/21/2023 0819   TRIG 75 08/21/2023 0819   HDL 62 08/21/2023 0819   CHOLHDL 2.5 01/03/2022 1153   LDLCALC 83 08/21/2023 0819   Hepatic Function Panel     Component Value Date/Time   PROT 7.8 11/11/2023 0934   PROT 6.7 08/21/2023 0819   ALBUMIN 3.7 11/11/2023 0934   ALBUMIN 4.0 08/21/2023 0819   AST 21 11/11/2023 0934   ALT 14 11/11/2023 0934   ALKPHOS 79 11/11/2023 0934   BILITOT 0.6 11/11/2023 0934   BILITOT <0.2 08/21/2023 0819   BILIDIR 0.2 01/13/2010 0205   IBILI 0.8 01/13/2010 0205      Component Value Date/Time   TSH 2.790 08/21/2023 0819   Nutritional Lab Results  Component Value Date   VD25OH 21.8 (L) 08/21/2023   VD25OH 23.3 (L) 11/09/2022   VD25OH 27.5 (L) 01/03/2022     ASSESSMENT AND PLAN  TREATMENT PLAN FOR OBESITY:  Recommended Dietary Goals  Lester is currently in the action stage of change. As such, her goal is to continue weight management plan. She has agreed to the Category 2 Plan.  Behavioral Intervention  We discussed the following Behavioral Modification Strategies today: decreasing simple carbohydrates , increasing vegetables, increasing fiber rich foods, avoiding skipping meals, increasing water intake , work on meal planning and preparation, reading food labels , keeping healthy foods at home, and continue to work on implementation of reduced calorie nutritional plan.  Additional resources provided  today: NA  Recommended Physical Activity Goals  Erisha has been advised to work up to 150 minutes of moderate intensity aerobic activity a week and strengthening exercises 2-3 times per week for cardiovascular health, weight loss maintenance and preservation of muscle mass.   She has agreed to Think about enjoyable ways to increase daily physical activity and overcoming barriers to exercise and Increase physical activity in their day and reduce sedentary time (increase NEAT).   Pharmacotherapy We discussed various medication options to help Myleigh with her weight loss efforts and we both agreed to restart Buproprion 200 mg every day and Metformin  500 mg every day off label for weight loss and assistance with emotional eating behaviors. Will restart cholecalciferol 50000 units daily for Vit D  deficiency. Start Zepbound  2.5 mg SQ QW.  ASSOCIATED CONDITIONS ADDRESSED TODAY  Action/Plan  Prediabetes Reviewed importance of 10-15% weight loss to help improve prediabetes and improve chances of fertility.  -     metFORMIN  HCl; Take 1 tablet (500 mg total) by mouth daily.  Dispense: 30 tablet; Refill: 0 -     CBC with Differential/Platelet -     Comprehensive metabolic panel with GFR  Vitamin D  deficiency -     VITAMIN D  25 Hydroxy (Vit-D Deficiency, Fractures) -     Vitamin D3; Take 1 capsule (1.25 mg total) by mouth once a week.  Dispense: 4 capsule; Refill: 0  Polyphagia -     Zepbound ; Inject 2.5 mg into the skin once a week.  Dispense: 2 mL; Refill: 0  Status post gastric banding -     Vitamin B12  Emotional Eating Behavior -     buPROPion  HCl ER (SR); Take 1 tablet (200 mg total) by mouth daily.  Dispense: 30 tablet; Refill: 0  Class 3 severe obesity with serious comorbidity and body mass index (BMI) of 40.0 to 44.9 in adult, unspecified obesity type         Return in about 3 weeks (around 08/25/2024) for with Lonell.. She was informed of the importance of frequent follow up  visits to maximize her success with intensive lifestyle modifications for her multiple health conditions.   ATTESTASTION STATEMENTS:  Reviewed by clinician on day of visit: allergies, medications, problem list, medical history, surgical history, family history, social history, and previous encounter notes.     Jerusalen Mateja ANP-C

## 2024-08-05 ENCOUNTER — Ambulatory Visit (INDEPENDENT_AMBULATORY_CARE_PROVIDER_SITE_OTHER): Payer: Self-pay | Admitting: Nurse Practitioner

## 2024-08-05 LAB — VITAMIN B12: Vitamin B-12: 375 pg/mL (ref 232–1245)

## 2024-08-05 LAB — CBC WITH DIFFERENTIAL/PLATELET
Basophils Absolute: 0.1 x10E3/uL (ref 0.0–0.2)
Basos: 1 %
EOS (ABSOLUTE): 0.1 x10E3/uL (ref 0.0–0.4)
Eos: 1 %
Hematocrit: 40.1 % (ref 34.0–46.6)
Hemoglobin: 12.8 g/dL (ref 11.1–15.9)
Immature Grans (Abs): 0 x10E3/uL (ref 0.0–0.1)
Immature Granulocytes: 0 %
Lymphocytes Absolute: 2 x10E3/uL (ref 0.7–3.1)
Lymphs: 23 %
MCH: 27.2 pg (ref 26.6–33.0)
MCHC: 31.9 g/dL (ref 31.5–35.7)
MCV: 85 fL (ref 79–97)
Monocytes Absolute: 0.5 x10E3/uL (ref 0.1–0.9)
Monocytes: 5 %
Neutrophils Absolute: 6.1 x10E3/uL (ref 1.4–7.0)
Neutrophils: 69 %
Platelets: 400 x10E3/uL (ref 150–450)
RBC: 4.71 x10E6/uL (ref 3.77–5.28)
RDW: 12.5 % (ref 11.7–15.4)
WBC: 8.7 x10E3/uL (ref 3.4–10.8)

## 2024-08-05 LAB — COMPREHENSIVE METABOLIC PANEL WITH GFR
ALT: 12 IU/L (ref 0–32)
AST: 14 IU/L (ref 0–40)
Albumin: 4.1 g/dL (ref 3.9–4.9)
Alkaline Phosphatase: 98 IU/L (ref 44–121)
BUN/Creatinine Ratio: 20 (ref 9–23)
BUN: 14 mg/dL (ref 6–24)
Bilirubin Total: 0.4 mg/dL (ref 0.0–1.2)
CO2: 19 mmol/L — ABNORMAL LOW (ref 20–29)
Calcium: 9.3 mg/dL (ref 8.7–10.2)
Chloride: 104 mmol/L (ref 96–106)
Creatinine, Ser: 0.71 mg/dL (ref 0.57–1.00)
Globulin, Total: 2.9 g/dL (ref 1.5–4.5)
Glucose: 75 mg/dL (ref 70–99)
Potassium: 4.3 mmol/L (ref 3.5–5.2)
Sodium: 139 mmol/L (ref 134–144)
Total Protein: 7 g/dL (ref 6.0–8.5)
eGFR: 110 mL/min/1.73 (ref >59)

## 2024-08-05 LAB — VITAMIN D 25 HYDROXY (VIT D DEFICIENCY, FRACTURES): Vit D, 25-Hydroxy: 20.9 ng/mL — ABNORMAL LOW (ref 30.0–100.0)

## 2024-08-06 NOTE — Telephone Encounter (Signed)
 Does not need Vit B12 supplementation as level was normal.  Thanks

## 2024-08-07 ENCOUNTER — Telehealth (INDEPENDENT_AMBULATORY_CARE_PROVIDER_SITE_OTHER): Payer: Self-pay | Admitting: *Deleted

## 2024-08-07 NOTE — Telephone Encounter (Signed)
 SHOLANDA CROSON (Key: [id number]) Zepbound  2.5MG /0.5ML pen-injectors  [facility] [employer] Plan   Message from Plan PA Case: [id number], Status: Approved, Coverage Starts on: 08/07/2024 12:00:00 AM,  Coverage Ends on: 08/07/2025 12:00:00 AM.. Authorization Expiration Date: August 07, 2025.  Patient notified via Mychart message.

## 2024-08-28 ENCOUNTER — Ambulatory Visit (INDEPENDENT_AMBULATORY_CARE_PROVIDER_SITE_OTHER): Payer: PRIVATE HEALTH INSURANCE | Admitting: Nurse Practitioner

## 2024-09-03 ENCOUNTER — Other Ambulatory Visit: Payer: Self-pay

## 2024-09-03 ENCOUNTER — Ambulatory Visit
Admission: RE | Admit: 2024-09-03 | Discharge: 2024-09-03 | Disposition: A | Discharge status: Home / Self Care | Attending: Emergency Medicine | Admitting: Emergency Medicine

## 2024-09-03 ENCOUNTER — Ambulatory Visit (INDEPENDENT_AMBULATORY_CARE_PROVIDER_SITE_OTHER): Admitting: Radiology

## 2024-09-03 ENCOUNTER — Ambulatory Visit

## 2024-09-03 VITALS — BP 115/87 | HR 81 | Temp 98.3°F | Resp 18 | Ht 62.0 in | Wt 259.0 lb

## 2024-09-03 DIAGNOSIS — R051 Acute cough: Secondary | ICD-10-CM

## 2024-09-03 DIAGNOSIS — J209 Acute bronchitis, unspecified: Secondary | ICD-10-CM | POA: Diagnosis not present

## 2024-09-03 LAB — POC SOFIA SARS ANTIGEN FIA: SARS Coronavirus 2 Ag: NEGATIVE

## 2024-09-03 MED ORDER — PREDNISONE 10 MG PO TABS: 40.0000 mg | ORAL_TABLET | Freq: Every day | ORAL | 0 refills | Status: AC

## 2024-09-03 MED ORDER — PROMETHAZINE-DM 6.25-15 MG/5ML PO SYRP: 5.0000 mL | ORAL_SOLUTION | Freq: Four times a day (QID) | ORAL | 0 refills | Status: AC | PRN

## 2024-09-03 NOTE — Discharge Instructions (Addendum)
 Chest x-ray did not show evidence of pneumonia.  I suspect you have bronchitis.  Take the cough medicine up to 4 times daily as needed, do not drink alcohol or drive on this medication as may cause drowsiness.  Start the prednisone  tomorrow with breakfast.  Ensure you are drinking at least 64 ounces of water daily, Mucinex 1200 mg daily can help loosen up secretions as well as sleeping with a humidifier.  Symptoms should improve with medication.  No improvement or any changes please return to clinic or follow-up with primary care provider for reevaluation.

## 2024-09-03 NOTE — ED Provider Notes (Signed)
 [facility]    CSN: [id number] Arrival date & time: 09/03/24  1711      History   Chief Complaint Chief Complaint  Patient presents with   Chills   Cough   Nasal Congestion    HPI Heather Boone is a 43 y.o. female.   Patient presents to clinic over concern of chills cough, nasal congestion, sneezing, shortness of breath and feeling unwell for the past 6 days.  Overall feels fatigued.  Decided to come into clinic because she has not been improving and the cough kept her up all night last night.  Generalized bodyaches at 8 out of 10 on pain scale.  Taking over-the-counter Tylenol  without any improvement.  No history of asthma.  History of prediabetes, she is not diabetic.  Reports history of pneumonia at a similar time last year. Hx of gastric banding.   The history is provided by the patient and medical records.  Cough   Past Medical History:  Diagnosis Date   B12 deficiency    Back pain    Constipation    Fatigue    Heartburn    Obesity    Pre-diabetes    Prediabetes    SOB (shortness of breath) on exertion    resolved (was associated with being sick)   Vitamin D  deficiency     Patient Active Problem List   Diagnosis Date Noted   Obesity (HCC)- Start BMI 45.53 Date 01/03/2022 08/21/2023   BMI 45.0-49.9, adult (HCC) Current BMI 48.2 08/21/2023   Adnexal mass 09/29/2022   Daytime somnolence 05/03/2022   At risk for heart disease 05/03/2022   Polyphagia 03/08/2021   Gastroesophageal reflux disease 03/31/2020   Other fatigue 10/01/2019   Class 3 severe obesity with serious comorbidity and body mass index (BMI) of 40.0 to 44.9 in adult 06/10/2019   Depression 11/07/2018   Status post gastric banding 12/13/2017   Insulin  resistance 12/13/2017   Vitamin D  deficiency 10/18/2017   Prediabetes 10/18/2017    Past Surgical History:  Procedure Laterality Date   LAPAROSCOPIC GASTRIC BANDING     dec 2011, [facility]   ROBOTIC ASSISTED SALPINGO  OOPHERECTOMY Right 11/23/2022   Procedure: POSSIBLE XI ROBOTIC ASSISTED UNILATERAL SALPINGO OOPHORECTOMY;  Surgeon: Viktoria Comer SAUNDERS, MD;  Location: [facility];  Service: Gynecology;  Laterality: Right;    OB History     Gravida  1   Para  0   Term      Preterm      AB  1   Living  0      SAB      IAB  1   Ectopic      Multiple      Live Births               Home Medications    Prior to Admission medications   Medication Sig Start Date End Date Taking? Authorizing Provider  predniSONE  (DELTASONE ) 10 MG tablet Take 4 tablets (40 mg total) by mouth daily with breakfast for 5 days. 09/03/24 09/08/24 Yes Karalee Hauter  N, FNP  promethazine -dextromethorphan (PROMETHAZINE -DM) 6.25-15 MG/5ML syrup Take 5 mLs by mouth 4 (four) times daily as needed for cough. 09/03/24  Yes Kimiye Strathman  N, FNP  acetaminophen  (TYLENOL ) 325 MG tablet Take 2 tablets (650 mg total) by mouth every 6 (six) hours as needed. Patient taking differently: Take 650 mg by mouth every 6 (six) hours as needed for mild pain. 04/28/22   Elnor Jayson LABOR, DO  buPROPion  (WELLBUTRIN  SR) 200 MG 12 hr tablet Take 1 tablet (200 mg total) by mouth daily. 08/04/24   Wilkinson, Dana E, NP  Cholecalciferol (VITAMIN D3) 1.25 MG (50000 UT) CAPS Take 1 capsule (1.25 mg total) by mouth once a week. 08/04/24   Wilkinson, Dana E, NP  famotidine  (PEPCID ) 20 MG tablet Take 1 tablet (20 mg total) by mouth 2 (two) times daily. 05/18/23   Geiple, Joshua, PA-C  metFORMIN  (GLUCOPHAGE ) 500 MG tablet Take 1 tablet (500 mg total) by mouth daily. 08/04/24 09/03/24  Wilkinson, Dana E, NP  ondansetron  (ZOFRAN -ODT) 4 MG disintegrating tablet Take 1 tablet (4 mg total) by mouth every 8 (eight) hours as needed for nausea or vomiting. 05/18/23   Desiderio Chew, PA-C  pantoprazole  (PROTONIX ) 20 MG tablet Take 1 tablet (20 mg total) by mouth daily. 05/18/23   Geiple, Joshua, PA-C  promethazine  (PHENERGAN ) 25 MG suppository Place 1 suppository (25 mg  total) rectally every 6 (six) hours as needed for nausea or vomiting. 11/11/23   Schutt, Marsa HERO, PA-C  tirzepatide  (ZEPBOUND ) 2.5 MG/0.5ML Pen Inject 2.5 mg into the skin once a week. 08/04/24   Jude Lonell BRAVO, NP    Family History Family History  Problem Relation Age of Onset   Stroke Mother    Hypertension Mother    Hyperlipidemia Mother    Anxiety disorder Mother    Lung cancer Mother    Hypertension Father    Hyperlipidemia Father    Heart disease Father    Obesity Father    Colon cancer Father     Social History Social History   Tobacco Use   Smoking status: Never   Smokeless tobacco: Never  Vaping Use   Vaping status: Never Used  Substance Use Topics   Alcohol use: No   Drug use: No     Allergies   Toradol [ketorolac tromethamine], Aleve [naproxen sodium], Motrin [ibuprofen], Other, and Tramadol   Review of Systems Review of Systems  Per HPI  Physical Exam Triage Vital Signs ED Triage Vitals  Encounter Vitals Group     BP 09/03/24 1724 115/87     Girls Systolic BP Percentile --      Girls Diastolic BP Percentile --      Boys Systolic BP Percentile --      Boys Diastolic BP Percentile --      Pulse Rate 09/03/24 1724 81     Resp 09/03/24 1724 18     Temp 09/03/24 1724 98.3 F (36.8 C)     Temp Source 09/03/24 1724 Oral     SpO2 09/03/24 1724 98 %     Weight 09/03/24 1726 259 lb 0.7 oz (117.5 kg)     Height 09/03/24 1726 5' 2 (1.575 m)     Head Circumference --      Peak Flow --      Pain Score 09/03/24 1726 8     Pain Loc --      Pain Education --      Exclude from Growth Chart --    No data found.  Updated Vital Signs BP 115/87 (BP Location: Right Arm)   Pulse 81   Temp 98.3 F (36.8 C) (Oral)   Resp 18   Ht 5' 2 (1.575 m)   Wt 259 lb 0.7 oz (117.5 kg)   LMP 09/02/2024 (Approximate)   SpO2 98%   BMI 47.38 kg/m   Visual Acuity Right Eye Distance:   Left Eye Distance:   Bilateral  Distance:    Right Eye Near:   Left  Eye Near:    Bilateral Near:     Physical Exam Vitals and nursing note reviewed.  Constitutional:      Appearance: Normal appearance.  HENT:     Head: Normocephalic and atraumatic.     Right Ear: External ear normal.     Left Ear: External ear normal.     Nose: Congestion and rhinorrhea present.     Mouth/Throat:     Mouth: Mucous membranes are moist.     Pharynx: Posterior oropharyngeal erythema present.  Eyes:     Conjunctiva/sclera: Conjunctivae normal.  Cardiovascular:     Rate and Rhythm: Normal rate and regular rhythm.     Heart sounds: Normal heart sounds. No murmur heard. Pulmonary:     Effort: Pulmonary effort is normal. No respiratory distress.     Breath sounds: Normal breath sounds.  Skin:    General: Skin is warm and dry.  Neurological:     General: No focal deficit present.     Mental Status: She is alert and oriented to person, place, and time.  Psychiatric:        Mood and Affect: Mood normal.        Behavior: Behavior normal.      UC Treatments / Results  Labs (all labs ordered are listed, but only abnormal results are displayed) Labs Reviewed  POC SOFIA SARS ANTIGEN FIA    EKG   Radiology DG Chest 2 View Result Date: 09/03/2024 CLINICAL DATA:  Shortness of breath and cough. EXAM: DG CHEST 2V COMPARISON:  Chest radiograph dated 01/29/2010. FINDINGS: The heart size and mediastinal contours are within normal limits. Both lungs are clear. The visualized skeletal structures are unremarkable. IMPRESSION: No active cardiopulmonary disease. Electronically Signed   By: Vanetta Chou M.D.   On: 09/03/2024 18:04    Procedures Procedures (including critical care time)  Medications Ordered in UC Medications - No data to display  Initial Impression / Assessment and Plan / UC Course  I have reviewed the triage vital signs and the nursing notes.  Pertinent labs & imaging results that were available during my care of the patient were reviewed by me and  considered in my medical decision making (see chart for details).  Vitals and triage reviewed, patient is hemodynamically stable.  Lungs vesicular, heart with regular rate and rhythm.  Congestion and rhinorrhea with postnasal drip present on physical exam.  POC COVID testing negative.  Chest x-ray per my interpretation does not show acute cardiopulmonary abnormality, confirmed with radiology overread.  Suspect bronchitis, will treat with steroid burst and cough management.  Plan of care, follow-up care return precautions given, no questions at this time.     Final Clinical Impressions(s) / UC Diagnoses   Final diagnoses:  Acute cough  Acute bronchitis, unspecified organism     Discharge Instructions      Chest x-ray did not show evidence of pneumonia.  I suspect you have bronchitis.  Take the cough medicine up to 4 times daily as needed, do not drink alcohol or drive on this medication as may cause drowsiness.  Start the prednisone  tomorrow with breakfast.  Ensure you are drinking at least 64 ounces of water daily, Mucinex 1200 mg daily can help loosen up secretions as well as sleeping with a humidifier.  Symptoms should improve with medication.  No improvement or any changes please return to clinic or follow-up with primary care provider for reevaluation.  ED Prescriptions     Medication Sig Dispense Auth. Provider   predniSONE  (DELTASONE ) 10 MG tablet Take 4 tablets (40 mg total) by mouth daily with breakfast for 5 days. 20 tablet Dreama, Caro Brundidge  N, FNP   promethazine -dextromethorphan (PROMETHAZINE -DM) 6.25-15 MG/5ML syrup Take 5 mLs by mouth 4 (four) times daily as needed for cough. 118 mL Dreama, Naomie Crow  N, FNP      PDMP not reviewed this encounter.   Dreama Thereasa SAILOR, FNP 09/03/24 1817

## 2024-09-03 NOTE — ED Triage Notes (Signed)
 Pt presents with complaints of chills, cough, nasal congestion, sneezing, and some SOB x 6 days. Body feels tired and worn down. Currently rates overall pain an 8/10. Having generalized body aches at this time. Works in the healthcare setting. OTC Tylenol  taken for symptoms with no improvement/relief.

## 2024-09-08 ENCOUNTER — Other Ambulatory Visit (INDEPENDENT_AMBULATORY_CARE_PROVIDER_SITE_OTHER): Payer: Self-pay | Admitting: Nurse Practitioner

## 2024-09-08 ENCOUNTER — Encounter (INDEPENDENT_AMBULATORY_CARE_PROVIDER_SITE_OTHER): Payer: Self-pay | Admitting: Nurse Practitioner

## 2024-09-08 ENCOUNTER — Ambulatory Visit (INDEPENDENT_AMBULATORY_CARE_PROVIDER_SITE_OTHER): Payer: PRIVATE HEALTH INSURANCE | Admitting: Nurse Practitioner

## 2024-09-08 VITALS — BP 124/76 | HR 71 | Temp 98.0°F | Ht 62.0 in | Wt 257.0 lb

## 2024-09-08 DIAGNOSIS — R7303 Prediabetes: Secondary | ICD-10-CM | POA: Diagnosis not present

## 2024-09-08 DIAGNOSIS — F5089 Other specified eating disorder: Secondary | ICD-10-CM

## 2024-09-08 DIAGNOSIS — E66813 Obesity, class 3: Secondary | ICD-10-CM | POA: Diagnosis not present

## 2024-09-08 DIAGNOSIS — F3289 Other specified depressive episodes: Secondary | ICD-10-CM

## 2024-09-08 DIAGNOSIS — E559 Vitamin D deficiency, unspecified: Secondary | ICD-10-CM | POA: Diagnosis not present

## 2024-09-08 DIAGNOSIS — Z6841 Body Mass Index (BMI) 40.0 and over, adult: Secondary | ICD-10-CM

## 2024-09-08 MED ORDER — METFORMIN HCL 500 MG PO TABS: 500.0000 mg | ORAL_TABLET | Freq: Every day | ORAL | 0 refills | Status: DC

## 2024-09-08 MED ORDER — VITAMIN D (ERGOCALCIFEROL) 1.25 MG (50000 UNIT) PO CAPS: ORAL_CAPSULE | ORAL | 1 refills | Status: DC

## 2024-09-08 MED ORDER — TIRZEPATIDE 5 MG/0.5ML ~~LOC~~ SOAJ: 5.0000 mg | SUBCUTANEOUS | 0 refills | Status: DC

## 2024-09-08 MED ORDER — BUPROPION HCL ER (SR) 200 MG PO TB12: 200.0000 mg | ORAL_TABLET | Freq: Every day | ORAL | 0 refills | Status: DC

## 2024-09-08 NOTE — Progress Notes (Signed)
 Office: [phone]  /  Fax: [phone]  WEIGHT SUMMARY AND BIOMETRICS  Weight Lost Since Last Visit: 2lb  Weight Gained Since Last Visit: 0lb   Vitals Temp: 98 F (36.7 C) BP: 124/76 Pulse Rate: 71 SpO2: 98 %   Anthropometric Measurements Height: 5' 2 (1.575 m) Weight: 257 lb (116.6 kg) BMI (Calculated): 46.99 Weight at Last Visit: 259lb Weight Lost Since Last Visit: 2lb Weight Gained Since Last Visit: 0lb Starting Weight: 257lb Total Weight Loss (lbs): 0 lb (0 kg)   Body Composition  Body Fat %: 54.2 % Fat Mass (lbs): 139.6 lbs Muscle Mass (lbs): 112 lbs Total Body Water (lbs): 91 lbs Visceral Fat Rating : 17   Other Clinical Data Fasting: No Labs: No Today's Visit #: 13 Starting Date: 01/03/22    Total Weight Loss: 0 Percent of body weight lost: 0   HPI  Chief Complaint: OBESITY  Heather Boone is here to discuss her progress with her obesity treatment plan. She is on the the Category 2 Plan and states she is following her eating plan approximately 90 % of the time. She states she is exercising 30-45 minutes 7 days per week.   Interval History:  Since last office visit she started Zepbound  2.5 mg SQ QW and has taken 4 shots. She has not noticed a decrease in hunger on starting dose. She has not skipped any meals.  She is trying to stick to category 2 meal plan. She does walking at work and walking her dog. She has not been drinking any sweetened drinks    Pharmacotherapy for weight loss: She is currently taking Zepbound  2.5 mg SQ QW for medical weight loss.  Denies side effects.    Previous pharmacotherapy for medical weight loss:  Wegovy   She stopped because she wanted to try Zepbound  but was not covered by previous insurance.    Bariatric surgery: She previously underwent laparoscopic gastric banding in 11/2010 at [facility].   PHYSICAL EXAM:  Blood pressure 124/76, pulse 71, temperature 98 F (36.7 C), height 5' 2 (1.575 m), weight  257 lb (116.6 kg), last menstrual period 09/02/2024, SpO2 98%. Body mass index is 47.01 kg/m.  General: Well Developed, well nourished, and in no acute distress.  HEENT: Normocephalic, atraumatic; EOMI, sclerae are anicteric. Skin: Warm and dry, good turgor Chest:  Normal excursion, shape, no gross ABN Respiratory: No conversational dyspnea; speaking in full sentences NeuroM-Sk:  Normal gross ROM all 4 extremities  Psych: A and O X 3, insight adequate, mood- full    DIAGNOSTIC DATA REVIEWED:  BMET    Component Value Date/Time   NA 139 08/04/2024 1518   K 4.3 08/04/2024 1518   CL 104 08/04/2024 1518   CO2 19 (L) 08/04/2024 1518   GLUCOSE 75 08/04/2024 1518   GLUCOSE 113 (H) 11/11/2023 0934   BUN 14 08/04/2024 1518   CREATININE 0.71 08/04/2024 1518   CALCIUM 9.3 08/04/2024 1518   GFRNONAA >60 11/11/2023 0934   GFRAA 130 03/10/2020 1146   Lab Results  Component Value Date   HGBA1C 5.4 08/21/2023   HGBA1C 5.5 07/13/2017   Lab Results  Component Value Date   INSULIN  17.3 08/21/2023   INSULIN  11.0 07/13/2017   Lab Results  Component Value Date   TSH 2.790 08/21/2023   CBC    Component Value Date/Time   WBC 8.7 08/04/2024 1518   WBC 10.4 11/11/2023 0935   RBC 4.71 08/04/2024 1518   RBC 4.76 11/11/2023 0935   HGB 12.8  08/04/2024 1518   HCT 40.1 08/04/2024 1518   PLT 400 08/04/2024 1518   MCV 85 08/04/2024 1518   MCH 27.2 08/04/2024 1518   MCH 26.9 11/11/2023 0935   MCHC 31.9 08/04/2024 1518   MCHC 32.8 11/11/2023 0935   RDW 12.5 08/04/2024 1518   Iron Studies No results found for: IRON, TIBC, FERRITIN, IRONPCTSAT Lipid Panel     Component Value Date/Time   CHOL 159 08/21/2023 0819   TRIG 75 08/21/2023 0819   HDL 62 08/21/2023 0819   CHOLHDL 2.5 01/03/2022 1153   LDLCALC 83 08/21/2023 0819   Hepatic Function Panel     Component Value Date/Time   PROT 7.0 08/04/2024 1518   ALBUMIN 4.1 08/04/2024 1518   AST 14 08/04/2024 1518   ALT 12  08/04/2024 1518   ALKPHOS 98 08/04/2024 1518   BILITOT 0.4 08/04/2024 1518   BILIDIR 0.2 01/13/2010 0205   IBILI 0.8 01/13/2010 0205      Component Value Date/Time   TSH 2.790 08/21/2023 0819   Nutritional Lab Results  Component Value Date   VD25OH 20.9 (L) 08/04/2024   VD25OH 21.8 (L) 08/21/2023   VD25OH 23.3 (L) 11/09/2022     ASSESSMENT AND PLAN  TREATMENT PLAN FOR OBESITY:  Recommended Dietary Goals  Heather Boone is currently in the action stage of change. As such, her goal is to continue weight management plan. She has agreed to the Category 2 Plan Behavioral Intervention  We discussed the following Behavioral Modification Strategies today: continue to work on maintaining a reduced calorie state, getting the recommended amount of protein, incorporating whole foods, making healthy choices, staying well hydrated and practicing mindfulness when eating. and increase protein intake, fibrous foods (25 grams per day for women, 30 grams for men) and water to improve satiety and decrease hunger signals. .  Additional resources provided today: NA  Recommended Physical Activity Goals  Heather Boone has been advised to work up to 150 minutes of moderate intensity aerobic activity a week and strengthening exercises 2-3 times per week for cardiovascular health, weight loss maintenance and preservation of muscle mass.   She has agreed to Increase physical activity in their day and reduce sedentary time (increase NEAT)., Increase volume of physical activity to a goal of 240 minutes a week, and Combine aerobic and strengthening exercises for efficiency and improved cardiometabolic health.   Pharmacotherapy We discussed various medication options to help Heather Boone with her weight loss efforts and we both agreed to increase zepbound  to 5 mg SQ QW as she has not noticed decrease in appetite on starting dose and increase ergocalciferol  to 50000 units twice a week as Vit D was still low on once a  week.  ASSOCIATED CONDITIONS ADDRESSED TODAY  Action/Plan  Vitamin D  deficiency Increase ergocalciferol  to 50000 units twice a week and will recheck in 12 weeks -     Vitamin D  (Ergocalciferol ); Take 1 capsule twice a week  Dispense: 9 capsule; Refill: 1  Class 3 severe obesity with serious comorbidity and body mass index (BMI) of 40.0 to 44.9 in adult, unspecified obesity type See above Obesity treatment plan -     Tirzepatide ; Inject 5 mg into the skin once a week.  Dispense: 2 mL; Refill: 0  Prediabetes Continue Category 2 meal plan Focus on limiting simple carbohydrate Continue Physical activity and increase to 240 minutes/week -     metFORMIN  HCl; Take 1 tablet (500 mg total) by mouth daily.  Dispense: 30 tablet; Refill: 0  Emotional Eating  Behavior Continue Buproprion 200 mg every day off label for control of emotional eating -     buPROPion  HCl ER (SR); Take 1 tablet (200 mg total) by mouth daily.  Dispense: 30 tablet; Refill: 0         No follow-ups on file.Heather Boone She was informed of the importance of frequent follow up visits to maximize her success with intensive lifestyle modifications for her multiple health conditions.   ATTESTASTION STATEMENTS:  Reviewed by clinician on day of visit: allergies, medications, problem list, medical history, surgical history, family history, social history, and previous encounter notes.     Heather Boone ANP-C

## 2024-09-09 ENCOUNTER — Telehealth (INDEPENDENT_AMBULATORY_CARE_PROVIDER_SITE_OTHER): Payer: Self-pay

## 2024-09-09 ENCOUNTER — Other Ambulatory Visit (INDEPENDENT_AMBULATORY_CARE_PROVIDER_SITE_OTHER): Payer: Self-pay | Admitting: Nurse Practitioner

## 2024-09-09 NOTE — Telephone Encounter (Signed)
 Heather Boone (Key: [id number]) Mounjaro  5MG /0.5ML auto-injectors

## 2024-09-09 NOTE — Telephone Encounter (Signed)
 Good afternoon!  Patient was expecting an increase in zepbound  (to 5mg /0.5) but she says the pharmacy has received a script for monjaro. She's requesting the correct prescription is called into her pharmacy at [facility] ([phone]). She says she sent a message this morning but I can't find the original message.   Thanks!

## 2024-09-09 NOTE — Telephone Encounter (Signed)
 PA for Mounjaro  has been placed, waiting for determination.

## 2024-09-10 ENCOUNTER — Encounter (INDEPENDENT_AMBULATORY_CARE_PROVIDER_SITE_OTHER): Payer: Self-pay | Admitting: Nurse Practitioner

## 2024-09-10 ENCOUNTER — Other Ambulatory Visit (INDEPENDENT_AMBULATORY_CARE_PROVIDER_SITE_OTHER): Payer: Self-pay | Admitting: Nurse Practitioner

## 2024-09-10 DIAGNOSIS — R7303 Prediabetes: Secondary | ICD-10-CM

## 2024-09-10 MED ORDER — ZEPBOUND 5 MG/0.5ML ~~LOC~~ SOAJ
5.0000 mg | SUBCUTANEOUS | 0 refills | Status: DC
Start: 2024-09-10 — End: 2024-10-20

## 2024-09-10 NOTE — Telephone Encounter (Signed)
 Pt called in stating her prescription should be for Zepbound  not Mounjaro . Please follow up with the pt.

## 2024-09-10 NOTE — Addendum Note (Signed)
 Addended by: DELORES CASTOR A on: 09/10/2024 12:46 PM   Modules accepted: Orders

## 2024-09-10 NOTE — Telephone Encounter (Signed)
 Your Mounjaro  was denied by your insurance Dana can discuss different medication options at your next visit to see what's the best option for you. You could call your Insurance to do a appeal on the denial on your Rx Mounjaro , sometimes that helps the approval. At this time please work on your meal plan and exercise until your next appointment. Let us  know if you have any questions, Don't hesitate to contact the office at [phone]. Thanks   Delon Bloodgood (Key: [id number]) Mounjaro  5MG /0.5ML auto-injectors PA Case: [id number], Status: Denied. Notification: Completed.

## 2024-09-22 ENCOUNTER — Telehealth (INDEPENDENT_AMBULATORY_CARE_PROVIDER_SITE_OTHER): Payer: Self-pay

## 2024-09-22 NOTE — Telephone Encounter (Signed)
 Heather Boone  (Key: [id number]) Zepbound  5MG /0.5ML pen-injectors

## 2024-09-22 NOTE — Telephone Encounter (Signed)
 Received fax that the PA doesn't needed an PA, Not required.

## 2024-10-02 ENCOUNTER — Encounter (INDEPENDENT_AMBULATORY_CARE_PROVIDER_SITE_OTHER): Payer: Self-pay | Admitting: Nurse Practitioner

## 2024-10-02 NOTE — Progress Notes (Deleted)
 Office: [phone]  /  Fax: [phone]  WEIGHT SUMMARY AND BIOMETRICS  No data recorded No data recorded  No data recorded No data recorded No data recorded No data recorded  Total Weight Loss: Percent of body weight lost:   Bio Impedance Data reviewed with patient:  HPI  Chief Complaint: OBESITY  Heather Boone is here to discuss her progress with her obesity treatment plan. She is on the {MWMwtlossportion/plan2:23431} and states she is following her eating plan approximately *** % of the time. She states she is exercising *** minutes *** days per week.   Interval History:  Since last office visit she ***   Pharmacotherapy for weight loss: She {srtis (Optional):29129} currently taking {srtpreviousweightlossmeds (Optional):29124} for medical weight loss.  Denies side effects.    Previous pharmacotherapy for medical weight loss:  {srtpreviousweightlossmeds (Optional):29124}  She stopped *** due to side effects of ***.  Bariatric surgery:  Patient is status post {srtweightlosssurgery:29125} by Dr.  PIERRETTE in ***.  Her highest weight prior to surgery was *** and her nadir weight after surgery was ***.  She is taking {srtvitamins (Optional):29126}.  She reports {srtrestriction (Optional):29127} restriction.     PHYSICAL EXAM:  There were no vitals taken for this visit. There is no height or weight on file to calculate BMI.  General: Well Developed, well nourished, and in no acute distress.  HEENT: Normocephalic, atraumatic; EOMI, sclerae are anicteric. Skin: Warm and dry, good turgor Chest:  Normal excursion, shape, no gross ABN Respiratory: No conversational dyspnea; speaking in full sentences NeuroM-Sk:  Normal gross ROM * 4 extremities  Psych: A and O X 3, insight adequate, mood- full    DIAGNOSTIC DATA REVIEWED:  BMET    Component Value Date/Time   NA 139 08/04/2024 1518   K 4.3 08/04/2024 1518   CL 104 08/04/2024 1518   CO2 19 (L) 08/04/2024 1518   GLUCOSE 75  08/04/2024 1518   GLUCOSE 113 (H) 11/11/2023 0934   BUN 14 08/04/2024 1518   CREATININE 0.71 08/04/2024 1518   CALCIUM 9.3 08/04/2024 1518   GFRNONAA >60 11/11/2023 0934   GFRAA 130 03/10/2020 1146   Lab Results  Component Value Date   HGBA1C 5.4 08/21/2023   HGBA1C 5.5 07/13/2017   Lab Results  Component Value Date   INSULIN  17.3 08/21/2023   INSULIN  11.0 07/13/2017   Lab Results  Component Value Date   TSH 2.790 08/21/2023   CBC    Component Value Date/Time   WBC 8.7 08/04/2024 1518   WBC 10.4 11/11/2023 0935   RBC 4.71 08/04/2024 1518   RBC 4.76 11/11/2023 0935   HGB 12.8 08/04/2024 1518   HCT 40.1 08/04/2024 1518   PLT 400 08/04/2024 1518   MCV 85 08/04/2024 1518   MCH 27.2 08/04/2024 1518   MCH 26.9 11/11/2023 0935   MCHC 31.9 08/04/2024 1518   MCHC 32.8 11/11/2023 0935   RDW 12.5 08/04/2024 1518   Iron Studies No results found for: IRON, TIBC, FERRITIN, IRONPCTSAT Lipid Panel     Component Value Date/Time   CHOL 159 08/21/2023 0819   TRIG 75 08/21/2023 0819   HDL 62 08/21/2023 0819   CHOLHDL 2.5 01/03/2022 1153   LDLCALC 83 08/21/2023 0819   Hepatic Function Panel     Component Value Date/Time   PROT 7.0 08/04/2024 1518   ALBUMIN 4.1 08/04/2024 1518   AST 14 08/04/2024 1518   ALT 12 08/04/2024 1518   ALKPHOS 98 08/04/2024 1518   BILITOT 0.4 08/04/2024 1518  BILIDIR 0.2 01/13/2010 0205   IBILI 0.8 01/13/2010 0205      Component Value Date/Time   TSH 2.790 08/21/2023 0819   Nutritional Lab Results  Component Value Date   VD25OH 20.9 (L) 08/04/2024   VD25OH 21.8 (L) 08/21/2023   VD25OH 23.3 (L) 11/09/2022     ASSESSMENT AND PLAN  TREATMENT PLAN FOR OBESITY:  Recommended Dietary Goals  Heather Boone is currently in the action stage of change. As such, her goal is to continue weight management plan. She has agreed to {MWMwtlossportion/plan2:23431}.  Behavioral Intervention  We discussed the following Behavioral Modification  Strategies today: {EMWMwtlossstrategies:28914::continue to work on maintaining a reduced calorie state, getting the recommended amount of protein, incorporating whole foods, making healthy choices, staying well hydrated and practicing mindfulness when eating.,increase protein intake, fibrous foods (25 grams per day for women, 30 grams for men) and water to improve satiety and decrease hunger signals. }.  Additional resources provided today: NA  Recommended Physical Activity Goals  Heather Boone has been advised to work up to 150 minutes of moderate intensity aerobic activity a week and strengthening exercises 2-3 times per week for cardiovascular health, weight loss maintenance and preservation of muscle mass.   She has agreed to {EMEXERCISE:28847::Think about enjoyable ways to increase daily physical activity and overcoming barriers to exercise,Increase physical activity in their day and reduce sedentary time (increase NEAT).,Increase volume of physical activity to a goal of 240 minutes a week,Combine aerobic and strengthening exercises for efficiency and improved cardiometabolic health.}   Pharmacotherapy We discussed various medication options to help Heather Boone with her weight loss efforts and we both agreed to ***.  ASSOCIATED CONDITIONS ADDRESSED TODAY  Action/Plan  There are no diagnoses linked to this encounter.       No follow-ups on file.Heather Boone She was informed of the importance of frequent follow up visits to maximize her success with intensive lifestyle modifications for her multiple health conditions.   ATTESTASTION STATEMENTS:  Reviewed by clinician on day of visit: allergies, medications, problem list, medical history, surgical history, family history, social history, and previous encounter notes.   I personally spent a total of *** minutes in the care of the patient today including {Time Based Coding:210964241}.   Heather Boone

## 2024-10-06 ENCOUNTER — Ambulatory Visit (INDEPENDENT_AMBULATORY_CARE_PROVIDER_SITE_OTHER): Admitting: Nurse Practitioner

## 2024-10-08 ENCOUNTER — Encounter (INDEPENDENT_AMBULATORY_CARE_PROVIDER_SITE_OTHER): Payer: Self-pay | Admitting: Nurse Practitioner

## 2024-10-20 ENCOUNTER — Ambulatory Visit (INDEPENDENT_AMBULATORY_CARE_PROVIDER_SITE_OTHER): Admitting: Nurse Practitioner

## 2024-10-20 ENCOUNTER — Encounter (INDEPENDENT_AMBULATORY_CARE_PROVIDER_SITE_OTHER): Payer: Self-pay | Admitting: Nurse Practitioner

## 2024-10-20 VITALS — BP 139/74 | HR 67 | Temp 97.6°F | Ht 62.0 in | Wt 250.0 lb

## 2024-10-20 DIAGNOSIS — E559 Vitamin D deficiency, unspecified: Secondary | ICD-10-CM

## 2024-10-20 DIAGNOSIS — E66813 Obesity, class 3: Secondary | ICD-10-CM

## 2024-10-20 DIAGNOSIS — F3289 Other specified depressive episodes: Secondary | ICD-10-CM

## 2024-10-20 DIAGNOSIS — Z6841 Body Mass Index (BMI) 40.0 and over, adult: Secondary | ICD-10-CM

## 2024-10-20 DIAGNOSIS — R7303 Prediabetes: Secondary | ICD-10-CM

## 2024-10-20 DIAGNOSIS — R110 Nausea: Secondary | ICD-10-CM

## 2024-10-20 DIAGNOSIS — F5089 Other specified eating disorder: Secondary | ICD-10-CM | POA: Diagnosis not present

## 2024-10-20 MED ORDER — METFORMIN HCL 500 MG PO TABS: 500.0000 mg | ORAL_TABLET | Freq: Every day | ORAL | 0 refills | Status: DC

## 2024-10-20 MED ORDER — ONDANSETRON HCL 4 MG PO TABS: 4.0000 mg | ORAL_TABLET | Freq: Three times a day (TID) | ORAL | 0 refills | Status: DC | PRN

## 2024-10-20 MED ORDER — VITAMIN D (ERGOCALCIFEROL) 1.25 MG (50000 UNIT) PO CAPS: ORAL_CAPSULE | ORAL | 1 refills | Status: DC

## 2024-10-20 MED ORDER — ZEPBOUND 5 MG/0.5ML ~~LOC~~ SOAJ: 5.0000 mg | SUBCUTANEOUS | 0 refills | Status: DC

## 2024-10-20 MED ORDER — BUPROPION HCL ER (SR) 200 MG PO TB12: 200.0000 mg | ORAL_TABLET | Freq: Every day | ORAL | 0 refills | Status: DC

## 2024-10-20 NOTE — Progress Notes (Signed)
 Office: [phone]  /  Fax: [phone]  WEIGHT SUMMARY AND BIOMETRICS  Weight Lost Since Last Visit: 7 lb  Weight Gained Since Last Visit: 0   Vitals Temp: 97.6 F (36.4 C) BP: 139/74 Pulse Rate: 67 SpO2: 99 %   Anthropometric Measurements Height: 5' 2 (1.575 m) Weight: 250 lb (113.4 kg) BMI (Calculated): 45.71 Weight at Last Visit: 257 lb Weight Lost Since Last Visit: 7 lb Weight Gained Since Last Visit: 0 Starting Weight: 257 lb Total Weight Loss (lbs): 7 lb (3.175 kg)   Body Composition  Body Fat %: 53 % Fat Mass (lbs): 132.8 lbs Muscle Mass (lbs): 111.6 lbs Total Body Water (lbs): 90.6 lbs Visceral Fat Rating : 17   Other Clinical Data Fasting: No Labs: No Today's Visit #: 14 Starting Date: 01/03/22    Total Weight Loss: 7 pounds  Bio Impedance Data reviewed with patient:Muscle mass in down 0.4 pounds and adipose is down 6.8 pounds. PBF decreased from 54.2% to 53%.  Visceral fat rating stayed the same at 17.  HPI  Chief Complaint: OBESITY  Heather Boone is here to discuss her progress with her obesity treatment plan. She is on the the Category 2 Plan and states she is following her eating plan approximately 90 % of the time. She states she is exercising 30 minutes 7 days per week.   Interval History:  Since last office visit she has been following a category 2 meal plan.  She tries to get 100 grams of protein daily.  Denies skipping meals. She is getting 32 ounces of water daily.  She has been drinking unsweet tea. She is walking 30 minutes 7 days a week. She has set up her total gym to start doing strength training as well.  Breakfast: protein shake Lunch: chicken water Dinner: salmon, vegetables(brussel sprouts, broccoli) She is on tirzepatide  5 mg and denies GI side effects   Pharmacotherapy for weight loss: She is currently taking Zepbound  5 mg SQ QW for medical weight loss.  She also Buproprion 200 MG every day for emotional eating behaviors.  She is on Metformin  500 mg every day . She is on Ergocalciferol  50000 units twice a week for Vitamin D  deficiency.  Denies side effects.       PHYSICAL EXAM:  Blood pressure 139/74, pulse 67, temperature 97.6 F (36.4 C), height 5' 2 (1.575 m), weight 250 lb (113.4 kg), SpO2 99%. Body mass index is 45.73 kg/m.  General: Well Developed, well nourished, and in no acute distress.  HEENT: Normocephalic, atraumatic; EOMI, sclerae are anicteric. Skin: Warm and dry, good turgor Chest:  Normal excursion, shape, no gross ABN Respiratory: No conversational dyspnea; speaking in full sentences NeuroM-Sk:  Normal gross ROM * 4 extremities  Psych: A and O X 3, insight adequate, mood- full    DIAGNOSTIC DATA REVIEWED:  Last metabolic panel Lab Results  Component Value Date   GLUCOSE 75 08/04/2024   NA 139 08/04/2024   K 4.3 08/04/2024   CL 104 08/04/2024   CO2 19 (L) 08/04/2024   BUN 14 08/04/2024   CREATININE 0.71 08/04/2024   EGFR 110 08/04/2024   CALCIUM 9.3 08/04/2024   PROT 7.0 08/04/2024   ALBUMIN 4.1 08/04/2024   LABGLOB 2.9 08/04/2024   AGRATIO 1.4 11/09/2022   BILITOT 0.4 08/04/2024   ALKPHOS 98 08/04/2024   AST 14 08/04/2024   ALT 12 08/04/2024   ANIONGAP 10 11/11/2023     Lab Results  Component Value Date   HGBA1C 5.4  08/21/2023   HGBA1C 5.5 07/13/2017   Lab Results  Component Value Date   INSULIN  17.3 08/21/2023   INSULIN  11.0 07/13/2017   Lab Results  Component Value Date   TSH 2.790 08/21/2023   CBC    Component Value Date/Time   WBC 8.7 08/04/2024 1518   WBC 10.4 11/11/2023 0935   RBC 4.71 08/04/2024 1518   RBC 4.76 11/11/2023 0935   HGB 12.8 08/04/2024 1518   HCT 40.1 08/04/2024 1518   PLT 400 08/04/2024 1518   MCV 85 08/04/2024 1518   MCH 27.2 08/04/2024 1518   MCH 26.9 11/11/2023 0935   MCHC 31.9 08/04/2024 1518   MCHC 32.8 11/11/2023 0935   RDW 12.5 08/04/2024 1518    Lipid Panel  Lab Results  Component Value Date   CHOL 159  08/21/2023   HDL 62 08/21/2023   LDLCALC 83 08/21/2023   TRIG 75 08/21/2023   CHOLHDL 2.5 01/03/2022     Nutritional Lab Results  Component Value Date   VD25OH 20.9 (L) 08/04/2024   VD25OH 21.8 (L) 08/21/2023   VD25OH 23.3 (L) 11/09/2022   Lab Results  Component Value Date   VITAMINB12 375 08/04/2024     ASSESSMENT AND PLAN  Class 3 severe obesity with serious comorbidity and body mass index (BMI) of 40.0 to 44.9 in adult, unspecified obesity type (HCC) TREATMENT PLAN FOR OBESITY:  Recommended Dietary Goals  Heather Boone is currently in the action stage of change. As such, her goal is to continue weight management plan. She has agreed to the Category 2 Plan.  Behavioral Intervention  We discussed the following Behavioral Modification Strategies today: avoiding skipping meals, increasing water intake , continue to work on maintaining a reduced calorie state, getting the recommended amount of protein, incorporating whole foods, making healthy choices, staying well hydrated and practicing mindfulness when eating., and increase protein intake, fibrous foods (25 grams per day for women, 30 grams for men) and water to improve satiety and decrease hunger signals. .   Recommended Physical Activity Goals  Denetra has been advised to work up to 150 minutes of moderate intensity aerobic activity a week and strengthening exercises 2-3 times per week for cardiovascular health, weight loss maintenance and preservation of muscle mass.   She has agreed to Think about enjoyable ways to increase daily physical activity and overcoming barriers to exercise, Increase physical activity in their day and reduce sedentary time (increase NEAT)., Increase volume of physical activity to a goal of 240 minutes a week, and Combine aerobic and strengthening exercises for efficiency and improved cardiometabolic health. She is strongly encouraged to start strength training 2-3 times a week.     Pharmacotherapy We discussed various medication options to help [name] with her weight loss efforts and we both agreed to continue Zepbound  5 mg SQ QW- denies side effects.  Continue Buproprion 200 mg every day for emotional eating behaviors- denies side effects. Continue Vit D ergocalciferol  50000 units twice a week- will recheck level at next visit.  ASSOCIATED CONDITIONS ADDRESSED TODAY  Action/Plan  Prediabetes Continue Category 2  meal plan, limit simple carbohydrates Continue exercise with current goal of 240 minutes of moderate to high intensity exercise/week and include strength training at least 3 days a week.  -     metFORMIN  HCl; Take 1 tablet (500 mg total) by mouth daily.  Dispense: 30 tablet; Refill: 0  Vitamin D  deficiency Supplement with Ergocalciferol  50000 units twice a week, plan to recheck labs at next visit  -  Vitamin D  (Ergocalciferol ); Take 1 capsule twice a week  Dispense: 9 capsule; Refill: 1  Emotional Eating Behavior Continue to use relaxation strategies, mindful eating strategies Continue Wellbutrin  200 mg every day- denies side effects -     buPROPion  HCl ER (SR); Take 1 tablet (200 mg total) by mouth daily.  Dispense: 30 tablet; Refill: 0  Class 3 severe obesity with serious comorbidity and body mass index (BMI) of 40.0 to 44.9 in adult, unspecified obesity type (HCC) See plan above -     Zepbound ; Inject 5 mg into the skin once a week.  Dispense: 2 mL; Refill: 0  Nausea She is written a prescription for Zofran  to use since she has been off her Zepbound  for 2 weeks- she has not experienced any GI side effects but will have in case she does get nauseous due to gap in injections.  -     Ondansetron  HCl; Take 1 tablet (4 mg total) by mouth every 8 (eight) hours as needed for nausea or vomiting.  Dispense: 20 tablet; Refill: 0         Return in about 4 weeks (around 11/17/2024).SABRA She was informed of the importance of frequent follow up visits to  maximize her success with intensive lifestyle modifications for her multiple health conditions.   ATTESTASTION STATEMENTS:  Reviewed by clinician on day of visit: allergies, medications, problem list, medical history, surgical history, family history, social history, and previous encounter notes.   Vinay Ertl ANP-C

## 2024-11-18 ENCOUNTER — Encounter (INDEPENDENT_AMBULATORY_CARE_PROVIDER_SITE_OTHER): Payer: Self-pay | Admitting: Nurse Practitioner

## 2024-11-18 ENCOUNTER — Encounter (INDEPENDENT_AMBULATORY_CARE_PROVIDER_SITE_OTHER): Payer: Self-pay

## 2024-11-19 ENCOUNTER — Telehealth (INDEPENDENT_AMBULATORY_CARE_PROVIDER_SITE_OTHER): Payer: Self-pay | Admitting: Nurse Practitioner

## 2024-12-04 ENCOUNTER — Encounter (INDEPENDENT_AMBULATORY_CARE_PROVIDER_SITE_OTHER): Payer: Self-pay | Admitting: Nurse Practitioner

## 2024-12-04 ENCOUNTER — Ambulatory Visit (INDEPENDENT_AMBULATORY_CARE_PROVIDER_SITE_OTHER): Admitting: Nurse Practitioner

## 2024-12-04 VITALS — BP 137/76 | HR 72 | Temp 97.6°F | Ht 62.0 in | Wt 243.0 lb

## 2024-12-04 DIAGNOSIS — Z6841 Body Mass Index (BMI) 40.0 and over, adult: Secondary | ICD-10-CM | POA: Diagnosis not present

## 2024-12-04 DIAGNOSIS — Z9884 Bariatric surgery status: Secondary | ICD-10-CM

## 2024-12-04 DIAGNOSIS — E66813 Obesity, class 3: Secondary | ICD-10-CM | POA: Diagnosis not present

## 2024-12-04 DIAGNOSIS — F3289 Other specified depressive episodes: Secondary | ICD-10-CM

## 2024-12-04 DIAGNOSIS — F5089 Other specified eating disorder: Secondary | ICD-10-CM

## 2024-12-04 DIAGNOSIS — R110 Nausea: Secondary | ICD-10-CM | POA: Diagnosis not present

## 2024-12-04 DIAGNOSIS — R7303 Prediabetes: Secondary | ICD-10-CM | POA: Diagnosis not present

## 2024-12-04 DIAGNOSIS — E559 Vitamin D deficiency, unspecified: Secondary | ICD-10-CM | POA: Diagnosis not present

## 2024-12-04 MED ORDER — METFORMIN HCL 500 MG PO TABS: 500.0000 mg | ORAL_TABLET | Freq: Every day | ORAL | 1 refills | Status: DC

## 2024-12-04 MED ORDER — BUPROPION HCL ER (SR) 200 MG PO TB12: 200.0000 mg | ORAL_TABLET | Freq: Every day | ORAL | 1 refills | Status: DC

## 2024-12-04 MED ORDER — ZEPBOUND 7.5 MG/0.5ML ~~LOC~~ SOAJ: 7.5000 mg | SUBCUTANEOUS | 1 refills | Status: DC

## 2024-12-04 MED ORDER — VITAMIN D (ERGOCALCIFEROL) 1.25 MG (50000 UNIT) PO CAPS: ORAL_CAPSULE | ORAL | 1 refills | Status: DC

## 2024-12-04 MED ORDER — ONDANSETRON HCL 4 MG PO TABS: 4.0000 mg | ORAL_TABLET | Freq: Three times a day (TID) | ORAL | 0 refills | Status: DC | PRN

## 2024-12-04 MED ORDER — ZEPBOUND 7.5 MG/0.5ML ~~LOC~~ SOAJ: 7.5000 mg | SUBCUTANEOUS | 0 refills | Status: DC

## 2024-12-04 NOTE — Progress Notes (Signed)
 Office: [phone]  /  Fax: [phone]  WEIGHT SUMMARY AND BIOMETRICS  Weight Lost Since Last Visit: 7lb  Weight Gained Since Last Visit: 0lb   Vitals Temp: 97.6 F (36.4 C) BP: 137/76 Pulse Rate: 72 SpO2: 99 %   Anthropometric Measurements Height: 5' 2 (1.575 m) Weight: 243 lb (110.2 kg) BMI (Calculated): 44.43 Weight at Last Visit: 250lb Weight Lost Since Last Visit: 7lb Weight Gained Since Last Visit: 0lb Starting Weight: 257lb Total Weight Loss (lbs): 14 lb (6.35 kg)   Body Composition  Body Fat %: 52.3 % Fat Mass (lbs): 127.6 lbs Muscle Mass (lbs): 110.4 lbs Total Body Water (lbs): 88.6 lbs Visceral Fat Rating : 16   Other Clinical Data Fasting: No Labs: no Today's Visit #: 15 Starting Date: 01/03/22    Total Weight Loss: 14 pounds Percent of body weight lost:5.4%  Bio Impedance Data reviewed with patient: Down 1.2 pounds of muscle, down 5.2 pounds of adipose. Visceral fat rating decreased 1 point from 17 to 16  HPI  Chief Complaint: OBESITY  Heather Boone is here to discuss her progress with her obesity treatment plan. She is on the the Category 2 Plan and states she is following her eating plan approximately 95 % of the time. She states she is exercising 30 minutes 3 days per week.   Interval History:  Since last office visit she has set up with a trainer 3 days a week. Thanksgiving went well had meat and vegetables.  She is trying to get about 75-90 grams of protein a day.  She is getting 32 ounces of water a day. She will have 1 strawberry lemonade at night. She has skipped an occasional breakfast or lunch.   Pharmacotherapy for weight loss: She is currently taking Zepbound  5 mg SQ QW for medical weight loss.  Denies side effects.  No change in voice, difficulty swallowing, abdominal pain, constipation. Wellbutrin  SR 200 mg every day for emotional eating and does believe it works well and denies side effects. Continues on Ergocalciferol  50000  units once a week for Vit D deficiency and denies side effects.  She does take Metformin  500 mg every day for prediabetes and denies side effects.      PHYSICAL EXAM:  Blood pressure 137/76, pulse 72, temperature 97.6 F (36.4 C), height 5' 2 (1.575 m), weight 243 lb (110.2 kg), SpO2 99%. Body mass index is 44.45 kg/m.  General: Well Developed, well nourished, and in no acute distress.  HEENT: Normocephalic, atraumatic; EOMI, sclerae are anicteric. Skin: Warm and dry, good turgor Chest:  Normal excursion, shape, no gross ABN Respiratory: No conversational dyspnea; speaking in full sentences NeuroM-Sk:  Normal gross ROM * 4 extremities  Psych: A and O X 3, insight adequate, mood- full    DIAGNOSTIC DATA REVIEWED:  BMET    Component Value Date/Time   NA 139 08/04/2024 1518   K 4.3 08/04/2024 1518   CL 104 08/04/2024 1518   CO2 19 (L) 08/04/2024 1518   GLUCOSE 75 08/04/2024 1518   GLUCOSE 113 (H) 11/11/2023 0934   BUN 14 08/04/2024 1518   CREATININE 0.71 08/04/2024 1518   CALCIUM 9.3 08/04/2024 1518   GFRNONAA >60 11/11/2023 0934   GFRAA 130 03/10/2020 1146   Lab Results  Component Value Date   HGBA1C 5.4 08/21/2023   HGBA1C 5.5 07/13/2017   Lab Results  Component Value Date   INSULIN  17.3 08/21/2023   INSULIN  11.0 07/13/2017   Lab Results  Component Value Date  TSH 2.790 08/21/2023   CBC    Component Value Date/Time   WBC 8.7 08/04/2024 1518   WBC 10.4 11/11/2023 0935   RBC 4.71 08/04/2024 1518   RBC 4.76 11/11/2023 0935   HGB 12.8 08/04/2024 1518   HCT 40.1 08/04/2024 1518   PLT 400 08/04/2024 1518   MCV 85 08/04/2024 1518   MCH 27.2 08/04/2024 1518   MCH 26.9 11/11/2023 0935   MCHC 31.9 08/04/2024 1518   MCHC 32.8 11/11/2023 0935   RDW 12.5 08/04/2024 1518   Iron Studies No results found for: IRON, TIBC, FERRITIN, IRONPCTSAT Lipid Panel     Component Value Date/Time   CHOL 159 08/21/2023 0819   TRIG 75 08/21/2023 0819   HDL 62  08/21/2023 0819   CHOLHDL 2.5 01/03/2022 1153   LDLCALC 83 08/21/2023 0819   Hepatic Function Panel     Component Value Date/Time   PROT 7.0 08/04/2024 1518   ALBUMIN 4.1 08/04/2024 1518   AST 14 08/04/2024 1518   ALT 12 08/04/2024 1518   ALKPHOS 98 08/04/2024 1518   BILITOT 0.4 08/04/2024 1518   BILIDIR 0.2 01/13/2010 0205   IBILI 0.8 01/13/2010 0205      Component Value Date/Time   TSH 2.790 08/21/2023 0819   Nutritional Lab Results  Component Value Date   VD25OH 20.9 (L) 08/04/2024   VD25OH 21.8 (L) 08/21/2023   VD25OH 23.3 (L) 11/09/2022     ASSESSMENT AND PLAN  Class 3 severe obesity with serious comorbidity and body mass index (BMI) of 40.0 to 44.9 in adult, unspecified obesity type (HCC)  TREATMENT PLAN FOR OBESITY:  Recommended Dietary Goals  Heather Boone is currently in the action stage of change. As such, her goal is to continue weight management plan. She has agreed to the Category 2 Plan.  Behavioral Intervention  We discussed the following Behavioral Modification Strategies today: increasing lean protein intake to established goals, increasing fiber rich foods, avoiding skipping meals, increasing water intake , continue to work on maintaining a reduced calorie state, getting the recommended amount of protein, incorporating whole foods, making healthy choices, staying well hydrated and practicing mindfulness when eating., and increase protein intake, fibrous foods (25 grams per day for women, 30 grams for men) and water to improve satiety and decrease hunger signals. .  She wants to lose weight in December - She does  recognize that she must follow a structured plan and will eat before social situation to meet her protein goals and minimze party foods - She will give away food gifts unless they are very low calories or high protein - She will avoid calorie-containing liquids such as holiday drinks, eggnog and alcohol -She will stick to her structured plan  strictly most of the time - This strategy should help her lose 1-2 pounds in December   Recommended Physical Activity Goals  Heather Boone has been advised to work up to 150 minutes of moderate intensity aerobic activity a week and strengthening exercises 2-3 times per week for cardiovascular health, weight loss maintenance and preservation of muscle mass.   She has agreed to Increase volume of physical activity to a goal of 240 minutes a week and Combine aerobic and strengthening exercises for efficiency and improved cardiometabolic health.   Pharmacotherapy We discussed various medication options to help Shamere with her weight loss efforts and we both agreed to increase Zepbound  to 7.5 mg SQ QW- denies abdominal pain, constipation, difficulty swallowing , change in voice. .  ASSOCIATED CONDITIONS ADDRESSED TODAY  Action/Plan  Prediabetes Continue Category 2  meal plan, limit simple carbohydrates Continue Metformin  500 mg qd Continue exercise with current goal of 240 minutes of moderate to high intensity exercise/week with strength training.  -     metFORMIN  HCl; Take 1 tablet (500 mg total) by mouth daily.  Dispense: 30 tablet; Refill: 1  Vitamin D  deficiency Continue to supplement with Ergocalciferol  50000 units once a week  Low vitamin D  levels can be associated with adiposity and may result in leptin resistance and weight gain. Also associated with fatigue.  Currently on vitamin D  supplementation without any adverse effects such as nausea, vomiting or muscle weakness.   -     Vitamin D  (Ergocalciferol ); Take 1 capsule twice a week  Dispense: 10 capsule; Refill: 1  Emotional Eating Behavior Continue Wellbutrin  200 mg every day Practice mindfulness when eating to distinguish hunger from cravings -     buPROPion  HCl ER (SR); Take 1 tablet (200 mg total) by mouth daily.  Dispense: 30 tablet; Refill: 1  Class 3 severe obesity with serious comorbidity and body mass index (BMI) of 40.0  to 44.9 in adult, unspecified obesity type (HCC) See plan above -     Zepbound ; Inject 7.5 mg into the skin once a week.  Dispense: 2 mL; Refill: 1  Status post gastric banding       Continue vitamin supplementation daily  Nausea Eat slow and stop eating when she gets the first signal that she is full Use Zofran  as needed 1 time q 8 hours PRN -     Ondansetron  HCl; Take 1 tablet (4 mg total) by mouth every 8 (eight) hours as needed for nausea or vomiting.  Dispense: 20 tablet; Refill: 0         Return in about 5 weeks (around 01/08/2025).SABRA She was informed of the importance of frequent follow up visits to maximize her success with intensive lifestyle modifications for her multiple health conditions.   ATTESTASTION STATEMENTS:  Reviewed by clinician on day of visit: allergies, medications, problem list, medical history, surgical history, family history, social history, and previous encounter notes.     Sherise Geerdes ANP-C

## 2024-12-17 ENCOUNTER — Ambulatory Visit (INDEPENDENT_AMBULATORY_CARE_PROVIDER_SITE_OTHER): Payer: Self-pay | Admitting: Nurse Practitioner

## 2025-01-12 ENCOUNTER — Encounter (INDEPENDENT_AMBULATORY_CARE_PROVIDER_SITE_OTHER): Payer: Self-pay

## 2025-01-12 ENCOUNTER — Encounter (INDEPENDENT_AMBULATORY_CARE_PROVIDER_SITE_OTHER): Payer: Self-pay | Admitting: Nurse Practitioner

## 2025-01-12 ENCOUNTER — Ambulatory Visit (INDEPENDENT_AMBULATORY_CARE_PROVIDER_SITE_OTHER): Admitting: Nurse Practitioner

## 2025-01-12 VITALS — BP 118/70 | HR 74 | Temp 98.2°F | Ht 62.0 in | Wt 241.0 lb

## 2025-01-12 DIAGNOSIS — R7303 Prediabetes: Secondary | ICD-10-CM | POA: Diagnosis not present

## 2025-01-12 DIAGNOSIS — Z6841 Body Mass Index (BMI) 40.0 and over, adult: Secondary | ICD-10-CM

## 2025-01-12 DIAGNOSIS — Z9884 Bariatric surgery status: Secondary | ICD-10-CM | POA: Diagnosis not present

## 2025-01-12 DIAGNOSIS — E66813 Obesity, class 3: Secondary | ICD-10-CM

## 2025-01-12 DIAGNOSIS — F5089 Other specified eating disorder: Secondary | ICD-10-CM

## 2025-01-12 DIAGNOSIS — K219 Gastro-esophageal reflux disease without esophagitis: Secondary | ICD-10-CM | POA: Diagnosis not present

## 2025-01-12 DIAGNOSIS — F3289 Other specified depressive episodes: Secondary | ICD-10-CM

## 2025-01-12 DIAGNOSIS — R110 Nausea: Secondary | ICD-10-CM

## 2025-01-12 DIAGNOSIS — E559 Vitamin D deficiency, unspecified: Secondary | ICD-10-CM | POA: Diagnosis not present

## 2025-01-12 MED ORDER — PANTOPRAZOLE SODIUM 20 MG PO TBEC: 20.0000 mg | DELAYED_RELEASE_TABLET | Freq: Every day | ORAL | 0 refills | Status: AC

## 2025-01-12 MED ORDER — ONDANSETRON HCL 4 MG PO TABS: 4.0000 mg | ORAL_TABLET | Freq: Three times a day (TID) | ORAL | 0 refills | Status: AC | PRN

## 2025-01-12 MED ORDER — VITAMIN D (ERGOCALCIFEROL) 1.25 MG (50000 UNIT) PO CAPS: ORAL_CAPSULE | ORAL | 1 refills | Status: AC

## 2025-01-12 MED ORDER — ZEPBOUND 7.5 MG/0.5ML ~~LOC~~ SOAJ: 7.5000 mg | SUBCUTANEOUS | 0 refills | Status: AC

## 2025-01-12 MED ORDER — METFORMIN HCL 500 MG PO TABS: 500.0000 mg | ORAL_TABLET | Freq: Every day | ORAL | 1 refills | Status: AC

## 2025-01-12 MED ORDER — BUPROPION HCL ER (SR) 200 MG PO TB12: 200.0000 mg | ORAL_TABLET | Freq: Every day | ORAL | 1 refills | Status: AC

## 2025-01-12 NOTE — Progress Notes (Signed)
 " Office: [phone]  /  Fax: [phone]  WEIGHT SUMMARY AND BIOMETRICS  Weight Lost Since Last Visit: 2 lb  Weight Gained Since Last Visit: 0   Vitals Temp: 98.2 F (36.8 C) BP: 118/70 Pulse Rate: 74 SpO2: 97 %   Anthropometric Measurements Height: 5' 2 (1.575 m) Weight: 241 lb (109.3 kg) BMI (Calculated): 44.07 Weight at Last Visit: 243 lb Weight Lost Since Last Visit: 2 lb Weight Gained Since Last Visit: 0 Starting Weight: 257 lb Total Weight Loss (lbs): 16 lb (7.258 kg)   Body Composition  Body Fat %: 51.1 % Fat Mass (lbs): 123.4 lbs Muscle Mass (lbs): 112.2 lbs Total Body Water (lbs): 87 lbs Visceral Fat Rating : 15   Other Clinical Data Fasting: no Labs: no Today's Visit #: 16 Starting Date: 01/03/22    Total Weight Loss: 16 pounds Percent of body weight lost: 6.2%   Bio Impedance Data reviewed with patient: Muscle is up 1.8 pounds, adipose is down 4.2 pounds. Visceral fat rating decreased 1 point from 16 to 15.   HPI  Chief Complaint: OBESITY  Lynita is here to discuss her progress with her obesity treatment plan. She is on the the Category 2 Plan and states she is following her eating plan approximately 90 % of the time. She states she is exercising 30 minutes 5 days per week. She is working out with a psychologist, educational.    Interval History:  Kama has been following her meal plan most days- she has been getting 100 grams of protein daily She is drinking about 24-32 ounces of water daily.  She has noticed this week she has wanted to eat more but can only eat small portions.  Breakfast: protein shake or smoothie Lunch: leftovers Dinner: Salmon and vegetables.     Pharmacotherapy for weight loss: She is currently taking Zepbound  7.5 mg once a week(the dose was increased at last visit) and Wellbutrin  SR 200 mg every day  for medical weight loss.  Denies side effects.    She continues on Metformin  500 mg every day for prediabetes. Denies side  effects. Last A1c was in normal range.  Lab Results  Component Value Date   HGBA1C 5.4 08/21/2023   She continues on Ergocalciferol  50000 units two times a week for Vit. D deficiency and denies side effects Last vitamin D  Lab Results  Component Value Date   VD25OH 20.9 (L) 08/04/2024   She continue to use Wellbutrin  200 mg every day for emotional eating behaviors and has been working well.   She has noticed a worsening of her GERD symptoms and is out of Protonix , states it worked well in the past.   PHYSICAL EXAM:  Blood pressure 118/70, pulse 74, temperature 98.2 F (36.8 C), height 5' 2 (1.575 m), weight 241 lb (109.3 kg), SpO2 97%. Body mass index is 44.08 kg/m.  General: Well Developed, well nourished, and in no acute distress.  HEENT: Normocephalic, atraumatic; EOMI, sclerae are anicteric. Skin: Warm and dry, good turgor Chest:  Normal excursion, shape, no gross ABN Respiratory: No conversational dyspnea; speaking in full sentences NeuroM-Sk:  Normal gross ROM * 4 extremities  Psych: A and O X 3, insight adequate, mood- full    DIAGNOSTIC DATA REVIEWED:  BMET    Component Value Date/Time   NA 139 08/04/2024 1518   K 4.3 08/04/2024 1518   CL 104 08/04/2024 1518   CO2 19 (L) 08/04/2024 1518   GLUCOSE 75 08/04/2024 1518   GLUCOSE 113 (H)  11/11/2023 0934   BUN 14 08/04/2024 1518   CREATININE 0.71 08/04/2024 1518   CALCIUM 9.3 08/04/2024 1518   GFRNONAA >60 11/11/2023 0934   GFRAA 130 03/10/2020 1146   Lab Results  Component Value Date   HGBA1C 5.4 08/21/2023   HGBA1C 5.5 07/13/2017   Lab Results  Component Value Date   INSULIN  17.3 08/21/2023   INSULIN  11.0 07/13/2017   Lab Results  Component Value Date   TSH 2.790 08/21/2023   CBC    Component Value Date/Time   WBC 8.7 08/04/2024 1518   WBC 10.4 11/11/2023 0935   RBC 4.71 08/04/2024 1518   RBC 4.76 11/11/2023 0935   HGB 12.8 08/04/2024 1518   HCT 40.1 08/04/2024 1518   PLT 400 08/04/2024 1518    MCV 85 08/04/2024 1518   MCH 27.2 08/04/2024 1518   MCH 26.9 11/11/2023 0935   MCHC 31.9 08/04/2024 1518   MCHC 32.8 11/11/2023 0935   RDW 12.5 08/04/2024 1518   Iron Studies No results found for: IRON, TIBC, FERRITIN, IRONPCTSAT Lipid Panel     Component Value Date/Time   CHOL 159 08/21/2023 0819   TRIG 75 08/21/2023 0819   HDL 62 08/21/2023 0819   CHOLHDL 2.5 01/03/2022 1153   LDLCALC 83 08/21/2023 0819   Hepatic Function Panel     Component Value Date/Time   PROT 7.0 08/04/2024 1518   ALBUMIN 4.1 08/04/2024 1518   AST 14 08/04/2024 1518   ALT 12 08/04/2024 1518   ALKPHOS 98 08/04/2024 1518   BILITOT 0.4 08/04/2024 1518   BILIDIR 0.2 01/13/2010 0205   IBILI 0.8 01/13/2010 0205      Component Value Date/Time   TSH 2.790 08/21/2023 0819   Nutritional Lab Results  Component Value Date   VD25OH 20.9 (L) 08/04/2024   VD25OH 21.8 (L) 08/21/2023   VD25OH 23.3 (L) 11/09/2022     ASSESSMENT AND PLAN Class 3 severe obesity with serious comorbidity and body mass index (BMI) of 40.0 to 44.9 in adult, unspecified obesity type (HCC) TREATMENT PLAN FOR OBESITY:  Recommended Dietary Goals  Heather Boone is currently in the action stage of change. As such, her goal is to continue weight management plan. She has agreed to the Category 3 Plan.  Behavioral Intervention  We discussed the following Behavioral Modification Strategies today: increasing lean protein intake to established goals, increasing water intake , continue to work on maintaining a reduced calorie state, getting the recommended amount of protein, incorporating whole foods, making healthy choices, staying well hydrated and practicing mindfulness when eating., and increase protein intake, fibrous foods (25 grams per day for women, 30 grams for men) and water to improve satiety and decrease hunger signals. .    Recommended Physical Activity Goals  Sharissa has been advised to work up to 240 minutes of  moderate intensity aerobic activity a week and strengthening exercises 2-3 times per week for cardiovascular health, weight loss maintenance and preservation of muscle mass.   She has agreed to Continue current level of physical activity , Increase volume of physical activity to a goal of 240 minutes a week, and Combine aerobic and strengthening exercises for efficiency and improved cardiometabolic health.   Pharmacotherapy We discussed various medication options to help Trynity with her weight loss efforts and we both agreed to continue Zepbound  7.5 mg SQ QW- experiences occasional nausea but zofran  does control.  ASSOCIATED CONDITIONS ADDRESSED TODAY  Action/Plan  Prediabetes Continue Category 3  meal plan, limit simple carbohydrates Continue Metformin   XR 500 mg every day- denies side effects Continue exercise with current goal of 150 minutes of moderate to high intensity exercise/week.  -     metFORMIN  HCl; Take 1 tablet (500 mg total) by mouth daily.  Dispense: 30 tablet; Refill: 1  Emotional Eating Behavior Continue Wellbutrin  SR 200 mg every day, denies side effects Continue to work on realizing the difference between hunger and cravings -     buPROPion  HCl ER (SR); Take 1 tablet (200 mg total) by mouth daily.  Dispense: 30 tablet; Refill: 1  Vitamin D  deficiency Supplement with Ergocalciferol  50000 units once a week  Low vitamin D  levels can be associated with adiposity and may result in leptin resistance and weight gain. Also associated with fatigue.  Currently on vitamin D  supplementation without any adverse effects such as nausea, vomiting or muscle weakness.   -     Vitamin D  (Ergocalciferol ); Take 1 capsule twice a week  Dispense: 10 capsule; Refill: 1  Status post gastric banding       Decrease portion sizes as needed with goal of 120 grams protein and 1500 calories  Class 3 severe obesity with serious comorbidity and body mass index (BMI) of 40.0 to 44.9 in adult,  unspecified obesity type (HCC) See Plan above -     Zepbound ; Inject 7.5 mg into the skin once a week.  Dispense: 2 mL; Refill: 0  Nausea Use day of and day after Zepbound  injection for nausea -     Ondansetron  HCl; Take 1 tablet (4 mg total) by mouth every 8 (eight) hours as needed for nausea or vomiting.  Dispense: 20 tablet; Refill: 0  Gastroesophageal reflux disease, unspecified whether esophagitis present Continue behavior and nutritional modification Continue Protonix  20 mg every day for control of GERD symptoms and monitor -     Pantoprazole  Sodium; Take 1 tablet (20 mg total) by mouth daily.  Dispense: 30 tablet; Refill: 0         Return in about 4 weeks (around 02/09/2025).SABRA She was informed of the importance of frequent follow up visits to maximize her success with intensive lifestyle modifications for her multiple health conditions.   ATTESTASTION STATEMENTS:  Reviewed by clinician on day of visit: allergies, medications, problem list, medical history, surgical history, family history, social history, and previous encounter notes.     Lonell Liverpool ANP-C "

## 2025-02-10 ENCOUNTER — Ambulatory Visit (INDEPENDENT_AMBULATORY_CARE_PROVIDER_SITE_OTHER): Admitting: Nurse Practitioner
